# Patient Record
Sex: Female | Born: 1965 | Race: White | Hispanic: No | Marital: Married | Smoking: Never smoker
Health system: Southern US, Community
[De-identification: ages and names within clinical notes are randomized; demographics above are authoritative.]

## PROBLEM LIST (undated history)

## (undated) DIAGNOSIS — E109 Type 1 diabetes mellitus without complications: Secondary | ICD-10-CM

## (undated) DIAGNOSIS — D179 Benign lipomatous neoplasm, unspecified: Secondary | ICD-10-CM

## (undated) DIAGNOSIS — F418 Other specified anxiety disorders: Secondary | ICD-10-CM

## (undated) DIAGNOSIS — S73109A Unspecified sprain of unspecified hip, initial encounter: Secondary | ICD-10-CM

## (undated) DIAGNOSIS — E1069 Type 1 diabetes mellitus with other specified complication: Secondary | ICD-10-CM

## (undated) DIAGNOSIS — I341 Nonrheumatic mitral (valve) prolapse: Secondary | ICD-10-CM

## (undated) DIAGNOSIS — E785 Hyperlipidemia, unspecified: Secondary | ICD-10-CM

## (undated) DIAGNOSIS — E119 Type 2 diabetes mellitus without complications: Secondary | ICD-10-CM

## (undated) DIAGNOSIS — F419 Anxiety disorder, unspecified: Secondary | ICD-10-CM

## (undated) DIAGNOSIS — E039 Hypothyroidism, unspecified: Secondary | ICD-10-CM

## (undated) DIAGNOSIS — E7800 Pure hypercholesterolemia, unspecified: Secondary | ICD-10-CM

## (undated) DIAGNOSIS — G43009 Migraine without aura, not intractable, without status migrainosus: Secondary | ICD-10-CM

## (undated) HISTORY — DX: Hypothyroidism, unspecified: E03.9

## (undated) HISTORY — PX: LAPAROSCOPIC LYSIS OF ADHESIONS: SHX5905

## (undated) HISTORY — DX: Anxiety disorder, unspecified: F41.9

## (undated) HISTORY — DX: Migraine without aura, not intractable, without status migrainosus: G43.009

## (undated) HISTORY — DX: Nonrheumatic mitral (valve) prolapse: I34.1

## (undated) HISTORY — DX: Hyperlipidemia, unspecified: E10.69

## (undated) HISTORY — DX: Type 1 diabetes mellitus with other specified complication: E78.5

## (undated) HISTORY — PX: LUMBAR MICRODISCECTOMY: SHX99

## (undated) HISTORY — DX: Type 2 diabetes mellitus without complications: E11.9

## (undated) HISTORY — DX: Other specified anxiety disorders: F41.8

## (undated) HISTORY — DX: Unspecified sprain of unspecified hip, initial encounter: S73.109A

## (undated) HISTORY — DX: Type 1 diabetes mellitus without complications: E10.9

## (undated) HISTORY — PX: OTHER SURGICAL HISTORY: SHX169

## (undated) HISTORY — DX: Pure hypercholesterolemia, unspecified: E78.00

## (undated) HISTORY — PX: LIPOMA EXCISION: SHX5283

## (undated) HISTORY — PX: BACK SURGERY: SHX140

## (undated) HISTORY — DX: Benign lipomatous neoplasm, unspecified: D17.9

---

## 2003-06-25 DIAGNOSIS — I341 Nonrheumatic mitral (valve) prolapse: Secondary | ICD-10-CM

## 2003-06-25 HISTORY — DX: Nonrheumatic mitral (valve) prolapse: I34.1

## 2010-08-23 DIAGNOSIS — Z8679 Personal history of other diseases of the circulatory system: Secondary | ICD-10-CM | POA: Insufficient documentation

## 2010-12-11 HISTORY — PX: TRANSTHORACIC ECHOCARDIOGRAM: SHX275

## 2011-02-13 HISTORY — PX: TRANSESOPHAGEAL ECHOCARDIOGRAM: SHX273

## 2019-10-23 DIAGNOSIS — K4190 Unilateral femoral hernia, without obstruction or gangrene, not specified as recurrent: Secondary | ICD-10-CM

## 2019-10-23 HISTORY — DX: Unilateral femoral hernia, without obstruction or gangrene, not specified as recurrent: K41.90

## 2019-12-07 DIAGNOSIS — E039 Hypothyroidism, unspecified: Secondary | ICD-10-CM | POA: Diagnosis not present

## 2019-12-07 DIAGNOSIS — Z794 Long term (current) use of insulin: Secondary | ICD-10-CM | POA: Diagnosis not present

## 2019-12-07 DIAGNOSIS — Z789 Other specified health status: Secondary | ICD-10-CM | POA: Diagnosis not present

## 2019-12-07 DIAGNOSIS — F3289 Other specified depressive episodes: Secondary | ICD-10-CM | POA: Diagnosis not present

## 2019-12-07 DIAGNOSIS — E109 Type 1 diabetes mellitus without complications: Secondary | ICD-10-CM | POA: Diagnosis not present

## 2020-02-07 ENCOUNTER — Encounter: Payer: Self-pay | Admitting: Nurse Practitioner

## 2020-02-07 ENCOUNTER — Other Ambulatory Visit: Payer: Self-pay

## 2020-02-07 ENCOUNTER — Ambulatory Visit: Payer: 59 | Admitting: Nurse Practitioner

## 2020-02-07 ENCOUNTER — Telehealth: Payer: Self-pay | Admitting: *Deleted

## 2020-02-07 VITALS — BP 124/80 | Ht 68.0 in | Wt 145.0 lb

## 2020-02-07 DIAGNOSIS — K4190 Unilateral femoral hernia, without obstruction or gangrene, not specified as recurrent: Secondary | ICD-10-CM

## 2020-02-07 NOTE — Telephone Encounter (Signed)
Staff message sent to referral coordinator to schedule in [place].

## 2020-02-07 NOTE — Patient Instructions (Addendum)
Schedule mammogram! [facility] [phone] [address] [place], [place] [address]   Hernia, Adult     A hernia happens when tissue inside your body pushes out through a weak spot in your belly muscles (abdominal wall). This makes a round lump (bulge). The lump may be:  In a scar from surgery that was done in your belly (incisional hernia).  Near your belly button (umbilical hernia).  In your groin (inguinal hernia). Your groin is the area where your leg meets your lower belly (abdomen). This kind of hernia could also be: ? In your scrotum, if you are female. ? In folds of skin around your vagina, if you are female.  In your upper thigh (femoral hernia).  Inside your belly (hiatal hernia). This happens when your stomach slides above the muscle between your belly and your chest (diaphragm). If your hernia is small and it does not cause pain, you may not need treatment. If your hernia is large or it causes pain, you may need surgery. Follow these instructions at home: Activity  Avoid stretching or overusing (straining) the muscles near your hernia. Straining can happen when you: ? Lift something heavy. ? Poop (have a bowel movement).  Do not lift anything that is heavier than 10 lb (4.5 kg), or the limit that you are told, until your doctor says that it is safe.  Use the strength of your legs when you lift something heavy. Do not use only your back muscles to lift. General instructions  Do these things if told by your doctor so you do not have trouble pooping (constipation): ? Drink enough fluid to keep your pee (urine) pale yellow. ? Eat foods that are high in fiber. These include fresh fruits and vegetables, whole grains, and beans. ? Limit foods that are high in fat and processed sugars. These include foods that are fried or sweet. ? Take medicine for trouble pooping.  When you cough, try to cough gently.  You may try to push your hernia in  by very gently pressing on it when you are lying down. Do not try to force the bulge back in if it will not push in easily.  If you are overweight, work with your doctor to lose weight safely.  Do not use any products that have nicotine or tobacco in them. These include cigarettes and e-cigarettes. If you need help quitting, ask your doctor.  If you will be having surgery (hernia repair), watch your hernia for changes in shape, size, or color. Tell your doctor if you see any changes.  Take over-the-counter and prescription medicines only as told by your doctor.  Keep all follow-up visits as told by your doctor. Contact a doctor if:  You get new pain, swelling, or redness near your hernia.  You poop fewer times in a week than normal.  You have trouble pooping.  You have poop (stool) that is more dry than normal.  You have poop that is harder or larger than normal. Get help right away if:  You have a fever.  You have belly pain that gets worse.  You feel sick to your stomach (nauseous).  You throw up (vomit).  Your hernia cannot be pushed in by very gently pressing on it when you are lying down. Do not try to force the bulge back in if it will not push in easily.  Your hernia: ? Changes in shape or size. ? Changes color. ? Feels hard  or it hurts when you touch it. These symptoms may represent a serious problem that is an emergency. Do not wait to see if the symptoms will go away. Get medical help right away. Call your local emergency services (911 in the [place] Summary  A hernia happens when tissue inside your body pushes out through a weak spot in the belly muscles. This creates a bulge.  If your hernia is small and it does not hurt, you may not need treatment. If your hernia is large or it hurts, you may need surgery.  If you will be having surgery, watch your hernia for changes in shape, size, or color. Tell your doctor about any changes. This information is not  intended to replace advice given to you by your health care provider. Make sure you discuss any questions you have with your health care provider. Document Revised: 10/01/2018 Document Reviewed: 03/12/2017 Elsevier Patient Education  [place].

## 2020-02-07 NOTE — Telephone Encounter (Signed)
-----   Message from Tamela Gammon, NP sent at 02/07/2020  1:00 PM EDT ----- Please send referral to general surgery for possible left femoral hernia. Thank you

## 2020-02-07 NOTE — Progress Notes (Signed)
   Acute Office Visit  Subjective:    Patient ID: Maria Evans, female    DOB: 1965-10-18, 54 y.o.   MRN: [id number]   HPI 54 y.o. G1P0001 presents today as new patient for possible hernia. She is here for a  bulge she noticed 3 months ago on her left groin. Denies pain, redness, changes in bowel habits, fever. Did notice pain with a bowel movement recently that lingered. Had an obstruction with adhesions years back, laparoscopoc lysis of adhesions performed.    Review of Systems  Constitutional: Negative.   Gastrointestinal: Positive for abdominal pain (left groin). Negative for abdominal distention, constipation and diarrhea.       Bulge on left groin  Genitourinary: Negative.        Objective:    Physical Exam Constitutional:      Appearance: Normal appearance.  Abdominal:     General: Abdomen is flat. Bowel sounds are normal. There is no distension.     Palpations: Abdomen is soft.     Tenderness: There is no abdominal tenderness.     Hernia: A hernia is present. Hernia is present in the left femoral area.       BP 124/80 (BP Location: Right Arm, Patient Position: Sitting, Cuff Size: Normal)   Ht 5\' 8"  (1.727 m)   Wt 145 lb (65.8 kg)   LMP 02/07/2016   BMI 22.05 kg/m  Wt Readings from Last 3 Encounters:  02/07/20 145 lb (65.8 kg)        Assessment & Plan:   Problem List Items Addressed This Visit    None    Visit Diagnoses    Non-recurrent unilateral femoral hernia without obstruction or gangrene    -  Primary      Plan: Based on presentation and exam this is most likely a femoral hernia as it is below the crease of the groin, did not reduce completely with lying down, and did not have cough impulse. We will send a referral to general surgery for further evaluation. She is aware we do not manage hernias. She agreeable to plan.     Tamela Gammon [facility], 12:54 PM 02/07/2020

## 2020-02-08 NOTE — Telephone Encounter (Signed)
Patient scheduled on 03/01/20 @ 10:30am with Gurney Maxin

## 2020-03-01 DIAGNOSIS — K4190 Unilateral femoral hernia, without obstruction or gangrene, not specified as recurrent: Secondary | ICD-10-CM | POA: Diagnosis not present

## 2020-03-03 ENCOUNTER — Other Ambulatory Visit: Payer: Self-pay | Admitting: General Surgery

## 2020-03-03 DIAGNOSIS — K4190 Unilateral femoral hernia, without obstruction or gangrene, not specified as recurrent: Secondary | ICD-10-CM

## 2020-03-07 ENCOUNTER — Other Ambulatory Visit: Payer: Self-pay

## 2020-03-07 ENCOUNTER — Encounter: Payer: Self-pay | Admitting: Nurse Practitioner

## 2020-03-07 ENCOUNTER — Ambulatory Visit (INDEPENDENT_AMBULATORY_CARE_PROVIDER_SITE_OTHER): Payer: 59 | Admitting: Nurse Practitioner

## 2020-03-07 VITALS — BP 122/80 | Ht 68.0 in | Wt 145.2 lb

## 2020-03-07 DIAGNOSIS — M8580 Other specified disorders of bone density and structure, unspecified site: Secondary | ICD-10-CM

## 2020-03-07 DIAGNOSIS — Z01419 Encounter for gynecological examination (general) (routine) without abnormal findings: Secondary | ICD-10-CM | POA: Diagnosis not present

## 2020-03-07 DIAGNOSIS — R87810 Cervical high risk human papillomavirus (HPV) DNA test positive: Secondary | ICD-10-CM | POA: Diagnosis not present

## 2020-03-07 NOTE — Progress Notes (Signed)
   [facility] SAMPLE [birth year] [id number]   History:  54 y.o. G1P0001 presents for annual exam without GYN complaints. 2003 LEEP, 02/2018 positive high risk HPV negative 16/18. Postmenopausal - no HRT, no bleeding. IUD removed 02/2018 with no resumption of cycles, FSH then was 49.9. History of DM and hypothyroidism managed by endocrinology. Was seen by me 02/07/2020 for probable femoral hernia with referral to general surgery. Ultrasound scheduled for 9/16/20211. Osteopenic, most recent bone density in 2019 showing t-score of -1.0 without elevated FRAX. Has appointment soon to establish care with PCP and will have labs done then.   Gynecologic History Patient's last menstrual period was 02/07/2016.   Contraception: post menopausal status Last Pap: 02/2018. Results were: High risk HPV negative 16/18 Last mammogram: 02/2019 . Results were: normal Last colonoscopy: 1-2 years ago per patient. Results were: normal Last Dexa: 2019. Results were: t-score -1.0  Past medical history, past surgical history, family history and social history were all reviewed and documented in the EPIC chart.  ROS:  A ROS was performed and pertinent positives and negatives are included.  Exam:  Vitals:   03/07/20 0943  BP: 122/80  Weight: 145 lb 3.2 oz (65.9 kg)  Height: 5\' 8"  (1.727 m)   Body mass index is 22.08 kg/m.  General appearance:  Normal Thyroid:  Symmetrical, normal in size, without palpable masses or nodularity. Respiratory  Auscultation:  Clear without wheezing or rhonchi Cardiovascular  Auscultation:  Regular rate, without rubs, murmurs or gallops  Edema/varicosities:  Not grossly evident Abdominal  Soft,nontender, without masses, guarding or rebound.  Liver/spleen:  No organomegaly noted  Hernia:  Left femoral bulge with standing, non-tender  Skin  Inspection:  Grossly normal   Breasts: Examined lying and sitting.   Right: Without masses, retractions, discharge or axillary  adenopathy.   Left: Without masses, retractions, discharge or axillary adenopathy. Gentitourinary   Inguinal/mons:  Normal without inguinal adenopathy  External genitalia:  Normal  BUS/Urethra/Skene's glands:  Normal  Vagina:  Normal  Cervix:  Normal  Uterus:  Anteverted, normal in size, shape and contour.  Midline and mobile  Adnexa/parametria:     Rt: Without masses or tenderness.   Lt: Without masses or tenderness.  Anus and perineum: Normal  Digital rectal exam: Normal sphincter tone without palpated masses or tenderness  Assessment/Plan:  54 y.o. G1P0001 for annual exam.   Well woman exam with routine gynecological exam - Education provided on SBEs, importance of preventative screenings, current guidelines, high calcium diet, regular exercise, and multivitamin daily.   Osteopenia, unspecified location - 2019 t-score -1.0 without elevated FRAX. Will have PCP check Vitamin D at visit.  Papanicolaou smear of cervix with positive high risk human papilloma virus (HPV) test - 02/2018. Repeat pap today with HR HPV typing. 2003 LEEP.   Follow up in 1 year for annual       [place], 9:57 AM 03/07/2020

## 2020-03-07 NOTE — Patient Instructions (Addendum)
[facility] [phone] [address] [place], [place] [address]   Health Maintenance for Postmenopausal Women Menopause is a normal process in which your ability to get pregnant comes to an end. This process happens slowly over many months or years, usually between the ages of 62 and 30. Menopause is complete when you have missed your menstrual periods for 12 months. It is important to talk with your health care provider about some of the most common conditions that affect women after menopause (postmenopausal women). These include heart disease, cancer, and bone loss (osteoporosis). Adopting a healthy lifestyle and getting preventive care can help to promote your health and wellness. The actions you take can also lower your chances of developing some of these common conditions. What should I know about menopause? During menopause, you may get a number of symptoms, such as:  Hot flashes. These can be moderate or severe.  Night sweats.  Decrease in sex drive.  Mood swings.  Headaches.  Tiredness.  Irritability.  Memory problems.  Insomnia. Choosing to treat or not to treat these symptoms is a decision that you make with your health care provider. Do I need hormone replacement therapy?  Hormone replacement therapy is effective in treating symptoms that are caused by menopause, such as hot flashes and night sweats.  Hormone replacement carries certain risks, especially as you become older. If you are thinking about using estrogen or estrogen with progestin, discuss the benefits and risks with your health care provider. What is my risk for heart disease and stroke? The risk of heart disease, heart attack, and stroke increases as you age. One of the causes may be a change in the body's hormones during menopause. This can affect how your body uses dietary fats, triglycerides, and cholesterol. Heart attack and stroke are medical emergencies. There are many  things that you can do to help prevent heart disease and stroke. Watch your blood pressure  High blood pressure causes heart disease and increases the risk of stroke. This is more likely to develop in people who have high blood pressure readings, are of African descent, or are overweight.  Have your blood pressure checked: ? Every 3-5 years if you are 61-53 years of age. ? Every year if you are 49 years old or older. Eat a healthy diet   Eat a diet that includes plenty of vegetables, fruits, low-fat dairy products, and lean protein.  Do not eat a lot of foods that are high in solid fats, added sugars, or sodium. Get regular exercise Get regular exercise. This is one of the most important things you can do for your health. Most adults should:  Try to exercise for at least 150 minutes each week. The exercise should increase your heart rate and make you sweat (moderate-intensity exercise).  Try to do strengthening exercises at least twice each week. Do these in addition to the moderate-intensity exercise.  Spend less time sitting. Even light physical activity can be beneficial. Other tips  Work with your health care provider to achieve or maintain a healthy weight.  Do not use any products that contain nicotine or tobacco, such as cigarettes, e-cigarettes, and chewing tobacco. If you need help quitting, ask your health care provider.  Know your numbers. Ask your health care provider to check your cholesterol and your blood sugar (glucose). Continue to have your blood tested as directed by your health care provider. Do I need screening for cancer? Depending on your health  history and family history, you may need to have cancer screening at different stages of your life. This may include screening for:  Breast cancer.  Cervical cancer.  Lung cancer.  Colorectal cancer. What is my risk for osteoporosis? After menopause, you may be at increased risk for osteoporosis. Osteoporosis is  a condition in which bone destruction happens more quickly than new bone creation. To help prevent osteoporosis or the bone fractures that can happen because of osteoporosis, you may take the following actions:  If you are 39-33 years old, get at least 1,000 mg of calcium and at least 600 mg of vitamin D per day.  If you are older than age 6 but younger than age 45, get at least 1,200 mg of calcium and at least 600 mg of vitamin D per day.  If you are older than age 62, get at least 1,200 mg of calcium and at least 800 mg of vitamin D per day. Smoking and drinking excessive alcohol increase the risk of osteoporosis. Eat foods that are rich in calcium and vitamin D, and do weight-bearing exercises several times each week as directed by your health care provider. How does menopause affect my mental health? Depression may occur at any age, but it is more common as you become older. Common symptoms of depression include:  Low or sad mood.  Changes in sleep patterns.  Changes in appetite or eating patterns.  Feeling an overall lack of motivation or enjoyment of activities that you previously enjoyed.  Frequent crying spells. Talk with your health care provider if you think that you are experiencing depression. General instructions See your health care provider for regular wellness exams and vaccines. This may include:  Scheduling regular health, dental, and eye exams.  Getting and maintaining your vaccines. These include: ? Influenza vaccine. Get this vaccine each year before the flu season begins. ? Pneumonia vaccine. ? Shingles vaccine. ? Tetanus, diphtheria, and pertussis (Tdap) booster vaccine. Your health care provider may also recommend other immunizations. Tell your health care provider if you have ever been abused or do not feel safe at home. Summary  Menopause is a normal process in which your ability to get pregnant comes to an end.  This condition causes hot flashes, night  sweats, decreased interest in sex, mood swings, headaches, or lack of sleep.  Treatment for this condition may include hormone replacement therapy.  Take actions to keep yourself healthy, including exercising regularly, eating a healthy diet, watching your weight, and checking your blood pressure and blood sugar levels.  Get screened for cancer and depression. Make sure that you are up to date with all your vaccines. This information is not intended to replace advice given to you by your health care provider. Make sure you discuss any questions you have with your health care provider. Document Revised: 06/03/2018 Document Reviewed: 06/03/2018 Elsevier Patient Education  2020 [employer].

## 2020-03-07 NOTE — Addendum Note (Signed)
Addended by: Thamas Jaegers on: 03/07/2020 10:25 AM   Modules accepted: Orders

## 2020-03-09 ENCOUNTER — Ambulatory Visit
Admission: RE | Admit: 2020-03-09 | Discharge: 2020-03-09 | Disposition: A | Discharge status: Home / Self Care | Payer: 59 | Source: Ambulatory Visit | Attending: General Surgery | Admitting: General Surgery

## 2020-03-09 ENCOUNTER — Other Ambulatory Visit: Payer: Self-pay

## 2020-03-09 ENCOUNTER — Encounter: Payer: Self-pay | Admitting: Internal Medicine

## 2020-03-09 ENCOUNTER — Other Ambulatory Visit: Payer: Self-pay | Admitting: Internal Medicine

## 2020-03-09 ENCOUNTER — Ambulatory Visit (INDEPENDENT_AMBULATORY_CARE_PROVIDER_SITE_OTHER): Payer: 59 | Admitting: Internal Medicine

## 2020-03-09 VITALS — BP 124/86 | HR 86 | Wt 145.0 lb

## 2020-03-09 DIAGNOSIS — R1909 Other intra-abdominal and pelvic swelling, mass and lump: Secondary | ICD-10-CM | POA: Diagnosis not present

## 2020-03-09 DIAGNOSIS — E1065 Type 1 diabetes mellitus with hyperglycemia: Secondary | ICD-10-CM

## 2020-03-09 DIAGNOSIS — K4190 Unilateral femoral hernia, without obstruction or gangrene, not specified as recurrent: Secondary | ICD-10-CM

## 2020-03-09 LAB — PAP, TP IMAGING W/ HPV RNA, RFLX HPV TYPE 16,18/45: HPV DNA High Risk: NOT DETECTED

## 2020-03-09 LAB — POCT GLYCOSYLATED HEMOGLOBIN (HGB A1C): Hemoglobin A1C: 6.9 % — AB (ref 4.0–5.6)

## 2020-03-09 MED ORDER — GLUCAGON 3 MG/DOSE NA POWD: 3.0000 mg | Freq: Once | NASAL | 11 refills | Status: DC | PRN

## 2020-03-09 NOTE — Patient Instructions (Addendum)
Please continue: - Tresiba 12-13 units daily but move it to am - NovoLog 1-5 units 15 min before meals  Please do the following approximately 15 minutes before every meal: - calculate carbs (C) - check sugars (S) - inject insulin (I)  Please continue Levothyroxine 112 mcg daily.  Take the thyroid hormone every day, with water, at least 30 minutes before breakfast, separated by at least 4 hours from: - acid reflux medications - calcium - iron - multivitamins  Please move Levothyroxine to am.  Please return in 3 months.   Basic Rules for Patients with Type I Diabetes Mellitus  1. The American Diabetes Association (ADA) recommended targets: - fasting sugar 80-130 - after meal sugar <180 - HbA1C <7%  2. Engage in ?150 min moderate exercise per week  3. Make sure you have ?8h of sleep every night as this helps both blood sugars and your weight.  4. Always keep a sugar log (not only record in your meter) and bring it to all appointments with [place].  5. "15-15 rule" for hypoglycemia: if sugars are low, take 15 g of carbs** ("fast sugar" - e.g. 4 glucose tablets, 4 oz orange juice), wait 15 min, then check sugars again. If still <80, repeat. Continue  until your sugars >80, then eat a normal meal.   6. Teach family members and coworkers to inject glucagon. Have a glucagon set at home and one at work. They should call 911 after using the set.  7. Check sugar before driving. If <100, correct, and only start driving if sugars rise ?100. Check sugar every hour when on a long drive.  8. Check sugar before exercising. If <100, correct, and only start exercising if sugars rise ?100. Check sugar every hour when on a long exercise routine and 1h after you finished exercising.   If >250, check urine for ketones. If you have moderate-large ketones in urine, do not start exercise. Hydrate yourself with clear liquids and correct the high sugar. Recheck sugars and ketones before attempting to  exercise.  Be aware that you might need less insulin when exercising.  *intense, short, exercise bursts can increase your sugars, but  *less intense, longer (>1h), exercise routines can decrease your sugars.   9. Make sure you have a MedAlert bracelet or pendant mentioning "Type I Diabetes Mellitus". If you have a prior episode of severe hypoglycemia or hypoglycemia unawareness, it should also mention this.  10. Please do not walk barefoot. Inspect your feet for sores/cuts and let us know if you have them.  **E.g. of "fast carbs": ? first choice (15 g):  1 tube glucose gel, GlucoPouch 15, 2 oz glucose liquid ? second choice (15-16 g):  3 or 4 glucose tablets (best taken  with water), 15 Dextrose Bits chewable ? third choice (15-20 g):   cup fruit juice,  cup regular soda, 1 cup skim milk,  1 cup sports drink ? fourth choice (15-20 g):  1 small tube [place] gel (not frosting), 2 tbsp raisins, 1 tbsp table sugar,  candy, jelly beans, gum drops - check package for carb amount   (adapted from: Lenice Pressman. "Insulin therapy and hypoglycemia" Endocrinol Metab Clin N Am 2012, 41: 57-87)  Sick Day Rules for Diabetes  Think S-K-I-L-L:  Sugars:  - if glucose >200, check every 3h and drink sugar free liquids  - if glucose <200, drink carb-containing liquids and recheck 30 min later  - if glucose high, correct with insulin  - if sugars <  60, initiate hypoglycemia management (take 15 g of fast carbs and check sugars in 15 min  - repeat until sugars remain >100).  Ketones:  When to check ketones?  When glucose >300 x2 if on insulin injections  When nausea, vomiting, diarrhea, abdominal pain, headache, fever - even if glucose is normal or low - because in this case, you need both glucose and insulin.    - if you have ketone strips for blood >> if ketones are more or equal than 0.6, need to increase insulin - if you have ketone strips for urine >> if ketones are more or equal than  "small", need to increase insulin  Insulin: Never skip long acting insulin, even if not eating!   Urine ketones Blood ketones Extra insulin?  no <0.6 no  small 0.6-1.5 Increase dose by 5%  moderate 1.5-3 Increase dose by 10%  large >3 Increase dose by at least 20%   Liquids: - if glucose >200, check every 3h and drink sugar free liquids  - if glucose <200, small sips of carb-containing liquids (e.g. Ginger ale, Gatorade, juice, etc.)  Let us know!   Call us if: Go to ED if: Call your primary care doctor if:  Sugars >300 for >8h Severe abdominal pain Fever >100F for 24h  Moderate to large  urine ketones or blood ketones >1.5 Difficulty breathing Other chronic diseases flaring up  Vomiting and unable to keep liquids down Signs of dehydration

## 2020-03-09 NOTE — Progress Notes (Signed)
Patient ID: Maria Evans, female   DOB: 09/12/1965, 54 y.o.   MRN: [id number]   This visit occurred during the SARS-CoV-2 public health emergency.  Safety protocols were in place, including screening questions prior to the visit, additional usage of staff PPE, and extensive cleaning of exam room while observing appropriate contact time as indicated for disinfecting solutions.   HPI: Maria Evans is a 54 y.o.-year-old very pleasant female, referred by her previous endocrinologist, Dr. Mary K. Lawrence ([place], [place]), for management of DM1, diagnosed at 54 y/o, uncontrolled, without long term complications.  DM1:  Reviewed HbA1c levels: 02/08/2019: HbA1c 7.5% No results found for: HGBA1C  Previously on Omni pod 2017-fall 2020 but stopped after running out of supplies and had pbs with insertion sites.  Currently on: -Tresiba 12-13 units daily -NovoLog 2-6 units 3 times a day before meals (1:10 ICR, target 120, ISF 60-65) She tried inhaled insulin in the past but she had a persistently severe cough and had to stop. She tried Glimepiride - not very effective.  She has a freestyle libre CGM. She would not want alarms (not on Freestyle 2)  Pt checks her sugars >4x a day and they are with her CGM.  Unfortunately, we could not download her CGM tracings.  The following are approximations of her blood sugars per review of her CGM tracings on her phone: - am: 80-200s - 2h after b'fast: 100-220 - lunch: 80-180 - 2h after lunch: 100-220 - dinner: 80-180 - 2h after dinner: 180-200 - bedtime: see above - nighttime: 180-280 Lowest sugar was 20-30s before after starting insulin >> more recently 45; she has hypoglycemia awareness at 70.  No glucagon kit at home. No previous hypoglycemia admission. She may drop her sugars after exercise -usually exercises earlier in the day. Highest sugar was >600 (pump failed). No previous DKA admissions.    Pt's meals are: - Breakfast:  Fruit, peanut butter and banana sandwich, oatmeal with fruit - Lunch: Turkey and cheese sandwich + pickle - Dinner: Home-cooked family meals - Snacks: 1 to 2 almonds, cheese, fruit, veggies  -+ History of stage III CKD, resolved after stopping NSAIDs last BUN/creatinine:  No results found for: BUN No results found for: CREATININE  - + HL: last set of lipids: No results found for: CHOL, HDL, LDLCALC, LDLDIRECT, TRIG, CHOLHDL On Lipitor 20.  - last eye exam was in 12/2019: No DR.   - no numbness and tingling in her feet.  No FH of DM.  She also has hypothyroidism:  Pt is on levothyroxine 112 mcg daily, taken: - at night! - fasting - at least 30 min from b'fast - no Ca, Fe, PPIs - + Multivitamins in am - not on Biotin  Last TSH: 02/09/2019: TSH 0.7474   She also has a history of anxiety/depression, migraines.  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  ROS: Constitutional: no weight gain, no weight loss, + fatigue, + hot flashes, no subjective hypothermia, no nocturia Eyes: no blurry vision, no xerophthalmia ENT: no sore throat, no nodules palpated in neck, no dysphagia, no odynophagia, no hoarseness, no tinnitus, no hypoacusis Cardiovascular: no CP, no SOB, no palpitations, no leg swelling Respiratory: no cough, no SOB, no wheezing Gastrointestinal: no N, no V, no D, no C, no acid reflux Musculoskeletal: + muscle, + joint aches Skin: no rash, no hair loss Neurological: no tremors, no numbness or tingling/no dizziness/+ rarely HAs (migraines) Psychiatric: + depression, + anxiety   Past Medical History:    Diagnosis Date  . Anxiety   . Diabetes ([place])   . Femoral hernia 10/2019  . Hypercholesteremia   . Hypothyroid   . MVP (mitral valve prolapse)    Past Surgical History:  Procedure Laterality Date  . BACK SURGERY    . LAPAROSCOPIC LYSIS OF ADHESIONS     Social History   Socioeconomic History  . Marital status: Divorced    Spouse name: Not on file  . Number of  children: 1  . Years of education: Not on file  . Highest education level: Not on file  Occupational History  . Occupation: Hospital pharmacist   Tobacco Use  . Smoking status: Never Smoker  . Smokeless tobacco: Never Used  Vaping Use  . Vaping Use: Never used  Substance and Sexual Activity  . Alcohol use: Yes    Comment: occ, wine or beer  . Drug use: Never  . Sexual activity: Yes    Birth control/protection: None  Other Topics Concern  . Not on file  Social History Narrative  . Not on file   Social Determinants of Health   Financial Resource Strain:   . Difficulty of Paying Living Expenses: Not on file  Food Insecurity:   . Worried About Charity fundraiser in the Last Year: Not on file  . Ran Out of Food in the Last Year: Not on file  Transportation Needs:   . Lack of Transportation (Medical): Not on file  . Lack of Transportation (Non-Medical): Not on file  Physical Activity:   . Days of Exercise per Week: Not on file  . Minutes of Exercise per Session: Not on file  Stress:   . Feeling of Stress : Not on file  Social Connections:   . Frequency of Communication with Friends and Family: Not on file  . Frequency of Social Gatherings with Friends and Family: Not on file  . Attends Religious Services: Not on file  . Active Member of Clubs or Organizations: Not on file  . Attends Archivist Meetings: Not on file  . Marital Status: Not on file  Intimate Partner Violence:   . Fear of Current or Ex-Partner: Not on file  . Emotionally Abused: Not on file  . Physically Abused: Not on file  . Sexually Abused: Not on file   Current Outpatient Medications on File Prior to Visit  Medication Sig Dispense Refill  . atorvastatin (LIPITOR) 20 MG tablet Take 20 mg by mouth daily.    Marland Kitchen escitalopram (LEXAPRO) 20 MG tablet as directed.    . insulin aspart (NOVOLOG) 100 UNIT/ML injection Inject into the skin as directed.    . Insulin Degludec (TRESIBA) 100 UNIT/ML SOLN  Inject into the skin as directed.    Marland Kitchen levothyroxine (SYNTHROID) 112 MCG tablet Take 112 mcg by mouth daily.    . Multiple Vitamin (MULTIVITAMIN) capsule Take 1 capsule by mouth daily.     No current facility-administered medications on file prior to visit.   Allergies  Allergen Reactions  . Macrobid [Nitrofurantoin] Itching   Family History  Problem Relation Age of Onset  . Hypercholesterolemia Mother     PE: BP 124/86   Pulse 86   Wt 145 lb (65.8 kg)   LMP 02/07/2016   SpO2 97%   BMI 22.05 kg/m  Wt Readings from Last 3 Encounters:  03/09/20 145 lb (65.8 kg)  03/07/20 145 lb 3.2 oz (65.9 kg)  02/07/20 145 lb (65.8 kg)   Temp Readings from Last 3  Encounters:  No data found for Temp   BP Readings from Last 3 Encounters:  03/09/20 124/86  03/07/20 122/80  02/07/20 124/80   Pulse Readings from Last 3 Encounters:  03/09/20 86           Constitutional: normal weight, in NAD Eyes: PERRLA, EOMI, no exophthalmos ENT: moist mucous membranes, no thyromegaly, no cervical lymphadenopathy Cardiovascular: RRR, No MRG Respiratory: CTA B Gastrointestinal: abdomen soft, NT, ND, BS+ Musculoskeletal: no deformities, strength intact in all 4 Skin: moist, warm, no rashes Neurological: no tremor with outstretched hands, DTR normal in all 4  ASSESSMENT: 1. DM1, uncontrolled, without long-term complications, but with hyperglycemia -She has a history of CKD stage III, which resolved after stopping NSAIDs  2. Hypothyroidism  PLAN:  1. Patient with long-standing, fairly well controlled DM1, previously on an Omni pod insulin pump, now on basal-bolus insulin regimen after she ran out of Omnipod supplies. - at today's visit, HbA1c is 6.9% (better) -  CGM interpretation: -  - I suggested to: There are no Patient Instructions on file for this visit. - Strongly advised her to start checking sugars at different times of the day - check at least 4 times a day, rotating checks - given  sugar log and advised how to fill it and to bring it at next appt  - given foot care handout and explained the principles  - given instructions for hypoglycemia management "15-15 rule"  - advised for yearly eye exams - sent glucagon kit Rx to pharmacy - advised to get ketone strips - advised to always have Glu tablets with her - advised for a Med-alert bracelet mentioning "type 1 diabetes mellitus". - given instruction Re: exercising and driving in DM1 (pt instructions) - also, given information about sick day rules - Return to clinic in 3 months   2. Hypothyroidism - latest thyroid labs reviewed with pt >> normal: 01/2019: 0.7474 - she continues on LT4 112 mcg daily - pt feels good on this dose. - we discussed about taking the thyroid hormone every day, with water, >30 minutes before breakfast, separated by >4 hours from acid reflux medications, calcium, iron, multivitamins. Pt. is eating her levothyroxine I and I strongly advised her to move into the morning, since daily that night is not conducive to good absorption of the medication.  I advised her to move multivitamins to the morning. - will check thyroid tests at next visit, after moving levothyroxine at night   Cristina Gherghe, MD PhD [facility]   

## 2020-03-17 ENCOUNTER — Telehealth: Payer: Self-pay | Admitting: Internal Medicine

## 2020-03-17 NOTE — Telephone Encounter (Signed)
Records sent

## 2020-03-17 NOTE — Telephone Encounter (Signed)
Referring provider requested that we send the office notes from patients 1st visit to them.  Fax # [phone]  Attn: Medical Records

## 2020-03-20 NOTE — Telephone Encounter (Signed)
error 

## 2020-03-30 DIAGNOSIS — Z1159 Encounter for screening for other viral diseases: Secondary | ICD-10-CM | POA: Diagnosis not present

## 2020-04-03 DIAGNOSIS — D171 Benign lipomatous neoplasm of skin and subcutaneous tissue of trunk: Secondary | ICD-10-CM | POA: Diagnosis not present

## 2020-04-10 ENCOUNTER — Ambulatory Visit: Payer: 59 | Attending: Internal Medicine

## 2020-04-10 DIAGNOSIS — Z23 Encounter for immunization: Secondary | ICD-10-CM

## 2020-04-10 NOTE — Progress Notes (Signed)
   Covid-19 Vaccination Clinic  Name:  KEISHLA OYER    MRN: [id number] DOB: 1966-03-20  04/10/2020  [name] was observed post Covid-19 immunization for 15 minutes without incident. She was provided with Vaccine Information Sheet and instruction to access the V-Safe system.   [name] was instructed to call 911 with any severe reactions post vaccine: Marland Kitchen Difficulty breathing  . Swelling of face and throat  . A fast heartbeat  . A bad rash all over body  . Dizziness and weakness

## 2020-04-24 ENCOUNTER — Telehealth: Payer: Self-pay | Admitting: Internal Medicine

## 2020-04-24 ENCOUNTER — Other Ambulatory Visit: Payer: Self-pay

## 2020-04-24 MED ORDER — FREESTYLE LIBRE 2 SENSOR MISC: 3 refills | Status: DC

## 2020-04-24 NOTE — Telephone Encounter (Signed)
Patient called to request a new RX for [employer] (may require PA)-previously filled by Dr. Purcell Nails in [facility] be sent to [facility].

## 2020-04-24 NOTE — Telephone Encounter (Signed)
RX sent to pharmacy  

## 2020-05-08 DIAGNOSIS — Z20822 Contact with and (suspected) exposure to covid-19: Secondary | ICD-10-CM | POA: Diagnosis not present

## 2020-05-10 ENCOUNTER — Telehealth: Payer: Self-pay

## 2020-05-10 ENCOUNTER — Telehealth: Payer: Self-pay | Admitting: Internal Medicine

## 2020-05-10 NOTE — Telephone Encounter (Signed)
New message    Prior authorization on Continuous Blood Gluc Sensor (FREESTYLE LIBRE 2 SENSOR) MISC

## 2020-05-15 NOTE — Telephone Encounter (Signed)
Libre prior authorization submitted via covermymeds.  Waiting response.  (Key: [id number])Need help? Call us at [phone] Status Sent to [employer] The plan will fax you a determination, typically within 1 to 5 business days.  How do I follow up? Drug FreeStyle Libre 14 Day Sensor Form [employer] Medication Request Form [employer] Medication Request Form [phone] [phone]

## 2020-05-22 NOTE — Telephone Encounter (Signed)
He will be ok with me, but please check with the pt first, I know she told me she does not want alarms on her sensor, and Dexcom does have alarms.  She can shut them off, however. If she agrees, please send enough for 3 months with 3 refills

## 2020-05-22 NOTE — Telephone Encounter (Signed)
Called patient, LVM advising her to call back to discuss options of Dexcom.  Left call back number.

## 2020-05-22 NOTE — Telephone Encounter (Signed)
Received notification from [place] that the [facility] was denied due to patient needing to have tried the Dexcom.  Would you like to send this?  Thanks!

## 2020-05-24 ENCOUNTER — Other Ambulatory Visit: Payer: Self-pay

## 2020-05-24 MED ORDER — DEXCOM G6 TRANSMITTER MISC: 3 refills | Status: DC

## 2020-05-24 MED ORDER — DEXCOM G6 SENSOR MISC: 3 refills | Status: DC

## 2020-05-24 MED ORDER — DEXCOM G6 RECEIVER DEVI: 0 refills | Status: DC

## 2020-05-24 NOTE — Telephone Encounter (Signed)
Called patient, she states that she will try the dexcom- I have sent this to her pharmacy.  She will come for her appointment on 12/17 to discuss any issues.

## 2020-05-25 ENCOUNTER — Telehealth: Payer: Self-pay | Admitting: Internal Medicine

## 2020-05-25 NOTE — Telephone Encounter (Signed)
[place] called for prior authorization for Dexcom sensor.  Please advise.

## 2020-05-25 NOTE — Telephone Encounter (Signed)
Outbound call to [place] to advise [employer] approved Dexcom G6 Meter sensors and transmitters reference authorization numbers [id number], [id number] and [id number] provided.

## 2020-05-31 ENCOUNTER — Other Ambulatory Visit: Payer: Self-pay | Admitting: Nurse Practitioner

## 2020-05-31 DIAGNOSIS — Z1231 Encounter for screening mammogram for malignant neoplasm of breast: Secondary | ICD-10-CM

## 2020-06-09 ENCOUNTER — Ambulatory Visit (INDEPENDENT_AMBULATORY_CARE_PROVIDER_SITE_OTHER): Payer: 59 | Admitting: Internal Medicine

## 2020-06-09 ENCOUNTER — Encounter: Payer: Self-pay | Admitting: Internal Medicine

## 2020-06-09 ENCOUNTER — Other Ambulatory Visit: Payer: Self-pay

## 2020-06-09 ENCOUNTER — Other Ambulatory Visit: Payer: Self-pay | Admitting: Internal Medicine

## 2020-06-09 VITALS — BP 120/78 | HR 73 | Ht 68.0 in | Wt 151.0 lb

## 2020-06-09 DIAGNOSIS — E1065 Type 1 diabetes mellitus with hyperglycemia: Secondary | ICD-10-CM

## 2020-06-09 DIAGNOSIS — E039 Hypothyroidism, unspecified: Secondary | ICD-10-CM | POA: Diagnosis not present

## 2020-06-09 LAB — T4, FREE: Free T4: 0.97 ng/dL (ref 0.60–1.60)

## 2020-06-09 LAB — COMPREHENSIVE METABOLIC PANEL
ALT: 18 U/L (ref 0–35)
AST: 19 U/L (ref 0–37)
Albumin: 4.5 g/dL (ref 3.5–5.2)
Alkaline Phosphatase: 59 U/L (ref 39–117)
BUN: 10 mg/dL (ref 6–23)
CO2: 32 mEq/L (ref 19–32)
Calcium: 9.4 mg/dL (ref 8.4–10.5)
Chloride: 103 mEq/L (ref 96–112)
Creatinine, Ser: 0.75 mg/dL (ref 0.40–1.20)
GFR: 91.2 mL/min (ref >60.00)
Glucose, Bld: 101 mg/dL — ABNORMAL HIGH (ref 70–99)
Potassium: 4.2 mEq/L (ref 3.5–5.1)
Sodium: 140 mEq/L (ref 135–145)
Total Bilirubin: 0.6 mg/dL (ref 0.2–1.2)
Total Protein: 7.3 g/dL (ref 6.0–8.3)

## 2020-06-09 LAB — LIPID PANEL
Cholesterol: 162 mg/dL (ref 0–200)
HDL: 73.5 mg/dL (ref >39.00)
LDL Cholesterol: 79 mg/dL (ref 0–99)
NonHDL: 88.16
Total CHOL/HDL Ratio: 2
Triglycerides: 44 mg/dL (ref 0.0–149.0)
VLDL: 8.8 mg/dL (ref 0.0–40.0)

## 2020-06-09 LAB — MICROALBUMIN / CREATININE URINE RATIO
Creatinine,U: 15.3 mg/dL
Microalb Creat Ratio: 4.6 mg/g (ref 0.0–30.0)
Microalb, Ur: <0.7 mg/dL (ref 0.0–1.9)

## 2020-06-09 LAB — POCT GLYCOSYLATED HEMOGLOBIN (HGB A1C): Hemoglobin A1C: 6.3 % — AB (ref 4.0–5.6)

## 2020-06-09 LAB — TSH: TSH: 0.36 u[IU]/mL (ref 0.35–4.50)

## 2020-06-09 MED ORDER — TRESIBA FLEXTOUCH 100 UNIT/ML ~~LOC~~ SOPN: PEN_INJECTOR | SUBCUTANEOUS | 3 refills | Status: DC

## 2020-06-09 MED ORDER — ATORVASTATIN CALCIUM 20 MG PO TABS
20.0000 mg | ORAL_TABLET | Freq: Every day | ORAL | 3 refills | Status: DC
Start: 2020-06-09 — End: 2020-06-09

## 2020-06-09 MED ORDER — NOVOLOG FLEXPEN 100 UNIT/ML ~~LOC~~ SOPN: PEN_INJECTOR | SUBCUTANEOUS | 3 refills | Status: DC

## 2020-06-09 NOTE — Patient Instructions (Addendum)
Please continue: - Tresiba 12-13 units at bedtime - NovoLog 2-6 units 3 times a day before meals (1:10 ICR, target 120, ISF 60-65)  Try to eliminate the snack at night or replace it with non-carbohydrate foods.  Try to add healthy fats to higher glycemic index meals.  Please continue to try to inject Novolog 15 min before meals.  Please continue Levothyroxine 112 mcg daily.  Take the thyroid hormone every day, with water, at least 30 minutes before breakfast, separated by at least 4 hours from: - acid reflux medications - calcium - iron - multivitamins  Please stop at the lab.  Please return in 3-4 months with your sugar log.

## 2020-06-09 NOTE — Progress Notes (Addendum)
Patient ID: Maria Evans, female   DOB: 1966/06/01, 54 y.o.   MRN: [id number]   This visit occurred during the SARS-CoV-2 public health emergency.  Safety protocols were in place, including screening questions prior to the visit, additional usage of staff PPE, and extensive cleaning of exam room while observing appropriate contact time as indicated for disinfecting solutions.   HPI: Maria Evans is a 54 y.o.-year-old very pleasant female, initially referred by her previous endocrinologist, Dr. Garvin Evans. [address] ([place], [place]), returning for follow-up for DM1, diagnosed at 54 y/o, fairly well controlled, without long term complications and hypothyroidism. Last visit 3 months ago.  DM1:  Reviewed HbA1c levels: Lab Results  Component Value Date   HGBA1C 6.9 (A) 03/09/2020  02/08/2019: HbA1c 7.5%  Previously on Omni pod 2017-fall 2020 but stopped after running out of supplies and had problems with the insertion sites.  She is currently on: - Tresiba 12-13 units daily >> at bedtime - NovoLog 2-6 units 3 times a day before meals (1:10 ICR, target 120, ISF 60-65) She tried inhaled insulin in the past but she had a persistently severe cough and had to stop. She tried Glimepiride - not very effective.  She was on the freestyle libre CGM but this was not covered by insurance so we sent a PA for the Dexcom CGM after last visit. Of note, she prefers a sensor without alarms.  However, since last visit, she kept the Dexcom alarms on and they do not bother her.  She checks her sugars more than 4 times a day with her CGM.  CGM parameters: - Average from CGM: 159+/-59 - time CGM attached: 98.3% - Coefficient of variation: 37%  Time in range:  - very low (<54): 0.4% - low (<70): 3% - normal range (70-180): 65% - high sugars (>180): 32% - very high sugars (>250): 7.9%    Lowest sugar was 20-30s before after starting insulin >> more recently 45 >> 52; she has hypoglycemia  awareness in the 52s. I sent a prescription for a glucagon kit at last visit. No previous hypoglycemia admission. She may drop her sugars after exercise -usually exercise earlier in the day. Highest sugar was >600 (pump failed) >> 399. No previous DKA admissions.  Pt's meals are: - Breakfast: Fruit, peanut butter and banana sandwich, oatmeal with fruit - Lunch: [place] and cheese sandwich + pickle - Dinner: Home-cooked family meals - Snacks: 1 to 2 almonds, cheese, fruit, veggies  -+ History of stage III CKD, resolved after stopping NSAIDs; last BUN/creatinine:  No results found for: BUN No results found for: CREATININE  -+ HL; last set of lipids: No results found for: CHOL, HDL, LDLCALC, LDLDIRECT, TRIG, CHOLHDL On Lipitor 20 - moved to every other day.  - last eye exam was in 12/2019: No DR.   -No numbness and tingling in her feet.  No FH of DM.  Hypothyroidism:  Pt is on levothyroxine 112 mcg daily, taken: - in am << moved from evening at last visit - fasting - at least 30 min from b'fast - no calcium - no iron - + multivitamins later in the day - no PPIs - not on Biotin  Reviewed TSH level: 02/09/2019: TSH 0.7474   Pt denies: - feeling nodules in neck - hoarseness - dysphagia - choking - SOB with lying down  She also has a history of anxiety/depression, migraines.  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 She got engaged since last OV.  ROS:  Constitutional: no weight gain/no weight loss, + fatigue, + subjective hyperthermia, no subjective hypothermia Eyes: no blurry vision, no xerophthalmia ENT: no sore throat, + see HPI Cardiovascular: no CP/no SOB/no palpitations/no leg swelling Respiratory: no cough/no SOB/no wheezing Gastrointestinal: no N/no V/no D/no C/no acid reflux Musculoskeletal: + muscle aches/+ joint aches Skin: no rashes, no hair loss Neurological: no tremors/no numbness/no tingling/no dizziness, + HAs  I reviewed pt's medications,  allergies, PMH, social hx, family hx, and changes were documented in the history of present illness. Otherwise, unchanged from my initial visit note.  Past Medical History:  Diagnosis Date  . Anxiety   . Diabetes ([place])   . Femoral hernia 10/2019  . Hypercholesteremia   . Hypothyroid   . MVP (mitral valve prolapse)    Past Surgical History:  Procedure Laterality Date  . BACK SURGERY    . LAPAROSCOPIC LYSIS OF ADHESIONS     Social History   Socioeconomic History  . Marital status: Significant Other    Spouse name: Not on file  . Number of children: 1  . Years of education: Not on file  . Highest education level: Not on file  Occupational History  . Occupation: Hospital pharmacist   Tobacco Use  . Smoking status: Never Smoker  . Smokeless tobacco: Never Used  Vaping Use  . Vaping Use: Never used  Substance and Sexual Activity  . Alcohol use: Yes    Comment: occ, wine or beer  . Drug use: Never  . Sexual activity: Yes    Birth control/protection: None  Other Topics Concern  . Not on file  Social History Narrative  . Not on file   Social Determinants of Health   Financial Resource Strain: Not on file  Food Insecurity: Not on file  Transportation Needs: Not on file  Physical Activity: Not on file  Stress: Not on file  Social Connections: Not on file  Intimate Partner Violence: Not on file   Current Outpatient Medications on File Prior to Visit  Medication Sig Dispense Refill  . atorvastatin (LIPITOR) 20 MG tablet Take 20 mg by mouth daily.    . Continuous Blood Gluc Receiver (DEXCOM G6 RECEIVER) DEVI Use to check blood sugar 4 times daily 1 each 0  . Continuous Blood Gluc Sensor (DEXCOM G6 SENSOR) MISC Use with dexcom to check blood sugar 4 times daily 9 each 3  . Continuous Blood Gluc Transmit (DEXCOM G6 TRANSMITTER) MISC Use with dexcom to check blood sugar 4 times daily 1 each 3  . escitalopram (LEXAPRO) 20 MG tablet as directed.    . Glucagon 3 MG/DOSE POWD  Place 3 mg into the nose once as needed for up to 1 dose. 1 each 11  . insulin aspart (NOVOLOG) 100 UNIT/ML injection Inject into the skin as directed.    . Insulin Degludec (TRESIBA) 100 UNIT/ML SOLN Inject into the skin as directed.    Marland Kitchen levothyroxine (SYNTHROID) 112 MCG tablet Take 112 mcg by mouth daily.    . Multiple Vitamin (MULTIVITAMIN) capsule Take 1 capsule by mouth daily.     No current facility-administered medications on file prior to visit.   Allergies  Allergen Reactions  . Macrobid [Nitrofurantoin] Itching   Family History  Problem Relation Age of Onset  . Hypercholesterolemia Mother     PE: BP 120/78   Pulse 73   Ht $R'5\' 8"'hE$  (1.727 m)   Wt 151 lb (68.5 kg)   LMP 02/07/2016   SpO2 97%   BMI 22.96  kg/m  Wt Readings from Last 3 Encounters:  06/09/20 151 lb (68.5 kg)  03/09/20 145 lb (65.8 kg)  03/07/20 145 lb 3.2 oz (65.9 kg)   Temp Readings from Last 3 Encounters:  No data found for Temp   BP Readings from Last 3 Encounters:  03/09/20 124/86  03/07/20 122/80  02/07/20 124/80   Pulse Readings from Last 3 Encounters:  03/09/20 86           Constitutional: normal weight, in NAD Eyes: PERRLA, EOMI, no exophthalmos ENT: moist mucous membranes, no thyromegaly, no cervical lymphadenopathy Cardiovascular: RRR, No MRG Respiratory: CTA B Gastrointestinal: abdomen soft, NT, ND, BS+ Musculoskeletal: no deformities, strength intact in all 4 Skin: moist, warm, no rashes Neurological: no tremor with outstretched hands, DTR normal in all 4  ASSESSMENT: 1. DM1, fairly well controlled, without long-term complications, but with hyperglycemia -She has a history of CKD stage III, which resolved after stopping NSAIDs  2. Hypothyroidism  PLAN:  1. Patient with longstanding, fairly well-controlled type 1 diabetes, previously on an Omni pod insulin pump, now on basal/bolus insulin regimen after ran out of OmniPod supplies. At last visit, HbA1c was better, at 6.9%. At  that time, we continued the same [place] dose but moved it to am. I also advised him to try to bolus NovoLog 15 minutes before every meal. We did not change the regimen otherwise. -At today's visit, she has a Dexcom CGM, change from a freestyle libre CGM, per insurance preference.  She is not bothered by the [facility] as much as she thought in the past.  For now, she agrees to continue with her Dexcom. CGM interpretation: -At today's visit, we reviewed her CGM downloads: It appears that 65% of values are in target range (goal >70%), while 32% are higher than 180 (goal <25%), and 3.4% are lower than 70 (goal <4%).  The calculated average blood sugar is 159.  The projected HbA1c for the next 3 months (GMI) is 7.1%. -Reviewing the CGM trends, it appears that her sugars are higher after approximately 11 PM, and they stay high until approximately 3 AM, when they start to improve.  Upon questioning, she is having a snack at bedtime, which is an old habit for her.  We discussed that now that she has a CGM with alarms, she can rely on it to tell it whether the sugars are dropping overnight, but in general, she would ideally avoid the snack.  If she absolutely wants to have a snack, I suggested low or no carb alternatives. -Also, her sugars started increasing after breakfast but artificially higher after lunch and they are at goal after dinner.  Upon questioning, she is not bolusing the entire amount of insulin required for certain meals as she is afraid that she may drop.  She is working on bolusing more for meals and I strongly advised her to continue to do so, again, since she does have a CGM on. -She tells me that she is doing better with bolusing her insulin of approximately 15 minutes before meals and she noticed an improvement in her blood sugars after she started to do so as opposed to bolusing at the beginning of the meal or after the meal. -I do not feel we need to change her regimen for now, but we discussed  about adding healthy fats to higher glycemic index meals so that she does not drop her blood sugars afterwards - I suggested to: Patient Instructions  Please continue: - Tresiba 12-13  units at bedtime - NovoLog 2-6 units 3 times a day before meals (1:10 ICR, target 120, ISF 60-65)  Try to eliminate the snack at night or replace it with non-carbohydrate foods.  Try to add healthy fats to higher glycemic index meals.  Please continue to try to inject Novolog 15 min before meals.  Please continue Levothyroxine 112 mcg daily.  Take the thyroid hormone every day, with water, at least 30 minutes before breakfast, separated by at least 4 hours from: - acid reflux medications - calcium - iron - multivitamins  Please stop at the lab.  Please return in 3-4 months with your sugar log.   - we checked her HbA1c: 6.3% (better) - advised to check sugars at different times of the day - 4x a day, rotating check times - advised for yearly eye exams >> she is UTD - return to clinic in 3-4 months  2. Hypothyroidism - latest thyroid labs reviewed with pt >> normal in 01/2019  - she continues on LT4 112 mcg daily - pt feels good on this dose. - we discussed about taking the thyroid hormone every day, with water, >30 minutes before breakfast, separated by >4 hours from acid reflux medications, calcium, iron, multivitamins. Pt. is taking it correctly now, after we moved LT4 from evening to morning at last visit. - will check thyroid tests today: TSH and fT4 - If labs are abnormal, she will need to return for repeat TFTs in 1.5 months  Needs LT4 refills.  Component     Latest Ref Rng & Units 06/09/2020  Sodium     135 - 145 mEq/L 140  Potassium     3.5 - 5.1 mEq/L 4.2  Chloride     96 - 112 mEq/L 103  CO2     19 - 32 mEq/L 32  Glucose     70 - 99 mg/dL 101 (H)  BUN     6 - 23 mg/dL 10  Creatinine     0.40 - 1.20 mg/dL 0.75  Total Bilirubin     0.2 - 1.2 mg/dL 0.6  Alkaline  Phosphatase     39 - 117 U/L 59  AST     0 - 37 U/L 19  ALT     0 - 35 U/L 18  Total Protein     6.0 - 8.3 g/dL 7.3  Albumin     3.5 - 5.2 g/dL 4.5  GFR     >60.00 mL/min 91.20  Calcium     8.4 - 10.5 mg/dL 9.4  Cholesterol     0 - 200 mg/dL 162  Triglycerides     0.0 - 149.0 mg/dL 44.0  HDL Cholesterol     >39.00 mg/dL 73.50  VLDL     0.0 - 40.0 mg/dL 8.8  LDL (calc)     0 - 99 mg/dL 79  Total CHOL/HDL Ratio      2  NonHDL      88.16  Microalb, Ur     0.0 - 1.9 mg/dL <0.7  Creatinine,U     mg/dL 15.3  MICROALB/CREAT RATIO     0.0 - 30.0 mg/g 4.6  Hemoglobin A1C     4.0 - 5.6 % 6.3 (A)  TSH     0.35 - 4.50 uIU/mL 0.36  T4,Free(Direct)     0.60 - 1.60 ng/dL 0.97   Labs are at goal.  TSH is at the lower end of normal, but for now I would suggest to  continue the current dose of levothyroxine and recheck her TFTs at next visit.  Philemon Kingdom, MD PhD [facility]

## 2020-06-09 NOTE — Addendum Note (Signed)
Addended by: Lauralyn Primes on: 06/09/2020 03:20 PM   Modules accepted: Orders

## 2020-06-12 ENCOUNTER — Other Ambulatory Visit: Payer: Self-pay | Admitting: Internal Medicine

## 2020-06-12 MED ORDER — LEVOTHYROXINE SODIUM 112 MCG PO TABS
112.0000 ug | ORAL_TABLET | Freq: Every day | ORAL | 3 refills | Status: DC
Start: 2020-06-12 — End: 2020-07-28

## 2020-06-12 NOTE — Addendum Note (Signed)
Addended by: Philemon Kingdom on: 06/12/2020 08:59 AM   Modules accepted: Orders

## 2020-06-20 ENCOUNTER — Other Ambulatory Visit (HOSPITAL_COMMUNITY): Payer: Self-pay | Admitting: Endocrinology

## 2020-07-11 ENCOUNTER — Ambulatory Visit: Payer: 59

## 2020-07-24 ENCOUNTER — Other Ambulatory Visit: Payer: Self-pay | Admitting: Internal Medicine

## 2020-07-24 ENCOUNTER — Encounter: Payer: Self-pay | Admitting: Internal Medicine

## 2020-07-24 DIAGNOSIS — E039 Hypothyroidism, unspecified: Secondary | ICD-10-CM

## 2020-07-27 ENCOUNTER — Other Ambulatory Visit: Payer: Self-pay

## 2020-07-27 ENCOUNTER — Ambulatory Visit: Payer: 59

## 2020-07-27 ENCOUNTER — Ambulatory Visit
Admission: RE | Admit: 2020-07-27 | Discharge: 2020-07-27 | Disposition: A | Discharge status: Home / Self Care | Payer: 59 | Source: Ambulatory Visit | Attending: Nurse Practitioner | Admitting: Nurse Practitioner

## 2020-07-27 DIAGNOSIS — Z1231 Encounter for screening mammogram for malignant neoplasm of breast: Secondary | ICD-10-CM | POA: Diagnosis not present

## 2020-07-28 ENCOUNTER — Other Ambulatory Visit: Payer: Self-pay | Admitting: Internal Medicine

## 2020-07-28 ENCOUNTER — Other Ambulatory Visit (INDEPENDENT_AMBULATORY_CARE_PROVIDER_SITE_OTHER): Payer: 59

## 2020-07-28 DIAGNOSIS — E039 Hypothyroidism, unspecified: Secondary | ICD-10-CM

## 2020-07-28 LAB — T4, FREE: Free T4: 1.36 ng/dL (ref 0.60–1.60)

## 2020-07-28 LAB — TSH: TSH: 0.27 u[IU]/mL — ABNORMAL LOW (ref 0.35–4.50)

## 2020-07-28 MED ORDER — LEVOTHYROXINE SODIUM 100 MCG PO TABS
100.0000 ug | ORAL_TABLET | Freq: Every day | ORAL | 3 refills | Status: DC
Start: 2020-07-28 — End: 2020-07-28

## 2020-07-31 ENCOUNTER — Other Ambulatory Visit (HOSPITAL_COMMUNITY): Payer: Self-pay | Admitting: Physician Assistant

## 2020-07-31 DIAGNOSIS — F419 Anxiety disorder, unspecified: Secondary | ICD-10-CM | POA: Diagnosis not present

## 2020-07-31 DIAGNOSIS — E039 Hypothyroidism, unspecified: Secondary | ICD-10-CM | POA: Diagnosis not present

## 2020-07-31 DIAGNOSIS — G8929 Other chronic pain: Secondary | ICD-10-CM | POA: Diagnosis not present

## 2020-07-31 DIAGNOSIS — I341 Nonrheumatic mitral (valve) prolapse: Secondary | ICD-10-CM | POA: Diagnosis not present

## 2020-07-31 DIAGNOSIS — M545 Low back pain, unspecified: Secondary | ICD-10-CM | POA: Diagnosis not present

## 2020-07-31 DIAGNOSIS — M25512 Pain in left shoulder: Secondary | ICD-10-CM | POA: Diagnosis not present

## 2020-07-31 DIAGNOSIS — E1165 Type 2 diabetes mellitus with hyperglycemia: Secondary | ICD-10-CM | POA: Diagnosis not present

## 2020-07-31 DIAGNOSIS — E785 Hyperlipidemia, unspecified: Secondary | ICD-10-CM | POA: Diagnosis not present

## 2020-07-31 DIAGNOSIS — S6991XA Unspecified injury of right wrist, hand and finger(s), initial encounter: Secondary | ICD-10-CM | POA: Diagnosis not present

## 2020-08-01 DIAGNOSIS — Z23 Encounter for immunization: Secondary | ICD-10-CM | POA: Diagnosis not present

## 2020-08-04 NOTE — Progress Notes (Signed)
Subjective:    CC: L shoulder pain  I, Molly Weber, LAT, ATC, am serving as scribe for Dr. Lynne Leader.  HPI: Pt is a 55 y/o RHD female presenting w/ c/o L shoulder pain since last summer w/ no known MOI.  She locates her pain to her L superior and lateral shoulder.  The pt is employed as a Dealer.  Radiating pain: yes into her L upper arm L shoulder mechanical symptoms: yes  Aggravating factors: donning/doffing clothes; pushing up off of her L hand/arm; trying to sleep w/ her L arm overhead; end-ranged L shoulder AROM Treatments tried: Avoiding painful movements; Diclofenac 75 mg prn; Tylenol 650mg  prn  Pertinent review of Systems: No fevers or chills  Relevant historical information: Diabetes   Objective:    Vitals:   08/07/20 1031  BP: 110/74  Pulse: 82  SpO2: 98%   General: Well Developed, well nourished, and in no acute distress.   MSK: C-spine normal-appearing Nontender. Normal cervical motion.  Left shoulder normal-appearing Nontender. Range of motion abduction full. External rotation full. Internal rotation lumbar spine. Strength: Abduction 4/5.  External rotation 5/5 internal rotation 5/5. Positive Hawkins and Neer's test.  Positive empty can test. Negative Yergason's and speeds test. Pulses cap refill and sensation are intact distally.  Lab and Radiology Results  X-ray images left shoulder obtained today personally and independently interpreted Shows no fractures, severe degenerative changes or malalignment. Await formal radiology review  Diagnostic Limited MSK Ultrasound of: Left shoulder Biceps tendon intact normal-appearing Subscapularis tendon is intact. Supraspinatus tendon is intact without tear. Small subacromial bursitis. Infraspinatus tendon is intact. AC joint with mild effusion Impression: Mild subacromial bursitis   Impression and Recommendations:    Assessment and Plan: 55 y.o. female with left shoulder pain  thought to be due to rotator cuff tendinopathy and subacromial bursitis.  Plan for physical therapy and home exercise program.  Recommend also Voltaren gel.  Recheck back in about 6 weeks.  Consider steroid injection versus MRI as next If not improved.  PDMP not reviewed this encounter. Orders Placed This Encounter  Procedures  . [place] LIMITED JOINT SPACE STRUCTURES UP LEFT(NO LINKED CHARGES)    Order Specific Question:   Reason for Exam (SYMPTOM  OR DIAGNOSIS REQUIRED)    Answer:   L shoulder pain    Order Specific Question:   Preferred imaging location?    Answer:   [place]  . DG Shoulder Left    Standing Status:   Future    Number of Occurrences:   1    Standing Expiration Date:   08/07/2021    Order Specific Question:   Reason for Exam (SYMPTOM  OR DIAGNOSIS REQUIRED)    Answer:   eval shoulder pain    Order Specific Question:   Is patient pregnant?    Answer:   No    Order Specific Question:   Preferred imaging location?    Answer:   [facility]  . Ambulatory referral to Physical Therapy    Referral Priority:   Routine    Referral Type:   Physical Medicine    Referral Reason:   Specialty Services Required    Requested Specialty:   Physical Therapy   No orders of the defined types were placed in this encounter.   Discussed warning signs or symptoms. Please see discharge instructions. Patient expresses understanding.   The above documentation has been reviewed and is accurate and complete Lynne Leader, M.D.

## 2020-08-07 ENCOUNTER — Ambulatory Visit: Payer: 59 | Admitting: Family Medicine

## 2020-08-07 ENCOUNTER — Ambulatory Visit: Payer: Self-pay

## 2020-08-07 ENCOUNTER — Other Ambulatory Visit: Payer: Self-pay

## 2020-08-07 ENCOUNTER — Ambulatory Visit (INDEPENDENT_AMBULATORY_CARE_PROVIDER_SITE_OTHER): Payer: 59

## 2020-08-07 ENCOUNTER — Encounter: Payer: Self-pay | Admitting: Family Medicine

## 2020-08-07 VITALS — BP 110/74 | HR 82 | Ht 68.0 in | Wt 152.4 lb

## 2020-08-07 DIAGNOSIS — G8929 Other chronic pain: Secondary | ICD-10-CM

## 2020-08-07 DIAGNOSIS — M25512 Pain in left shoulder: Secondary | ICD-10-CM | POA: Diagnosis not present

## 2020-08-07 NOTE — Progress Notes (Signed)
X-ray left shoulder shows some arthritis in the tiny joint of the top of the shoulder.  Otherwise looks normal to radiology.

## 2020-08-07 NOTE — Patient Instructions (Signed)
Thank you for coming in today.  Please get an Xray today before you leave  I've referred you to Physical Therapy.  Let us know if you don't hear from them in one week.  Please use voltaren gel up to 4x daily for pain as needed.   Recheck in 6 weeks.   Let me know if you have a problem.    Shoulder Impingement Syndrome  Shoulder impingement syndrome is a condition that causes pain when connective tissues (tendons) surrounding the shoulder joint become pinched. These tendons are part of the group of muscles and tissues that help to stabilize the shoulder (rotator cuff). Beneath the rotator cuff is a fluid-filled sac (bursa) that allows the muscles and tendons to glide smoothly. The bursa may become swollen or irritated (bursitis). Bursitis, swelling in the rotator cuff tendons, or both conditions can decrease how much space is under a bone in the shoulder joint (acromion), resulting in impingement. What are the causes? Shoulder impingement syndrome may be caused by bursitis or swelling of the rotator cuff tendons, which may result from:  Repetitive overhead arm movements.  Falling onto the shoulder.  Weakness in the shoulder muscles. What increases the risk? You may be more likely to develop this condition if you:  Play sports that involve throwing, such as baseball.  Participate in sports such as tennis, volleyball, and swimming.  Work as a Curator, Games developer, or Architect. Some people are also more likely to develop impingement syndrome because of the shape of their acromion bone. What are the signs or symptoms? The main symptom of this condition is pain on the front or side of the shoulder. The pain may:  Get worse when lifting or raising the arm.  Get worse at night.  Wake you up from sleeping.  Feel sharp when the shoulder is moved and then fade to an ache. Other symptoms may include:  Tenderness.  Stiffness.  Inability to raise the arm above shoulder level or  behind the body.  Weakness. How is this diagnosed? This condition may be diagnosed based on:  Your symptoms and medical history.  A physical exam.  Imaging tests, such as: ? X-rays. ? MRI. ? Ultrasound. How is this treated? This condition may be treated by:  Resting your shoulder and avoiding all activities that cause pain or put stress on the shoulder.  Icing your shoulder.  NSAIDs to help reduce pain and swelling.  One or more injections of medicines to numb the area and reduce inflammation.  Physical therapy.  Surgery. This may be needed if nonsurgical treatments have not helped. Surgery may involve repairing the rotator cuff, reshaping the acromion, or removing the bursa. Follow these instructions at home: Managing pain, stiffness, and swelling  If directed, put ice on the injured area. ? Put ice in a plastic bag. ? Place a towel between your skin and the bag. ? Leave the ice on for 20 minutes, 2-3 times a day.   Activity  Rest and return to your normal activities as told by your health care provider. Ask your health care provider what activities are safe for you.  Do exercises as told by your health care provider. General instructions  Do not use any products that contain nicotine or tobacco, such as cigarettes, e-cigarettes, and chewing tobacco. These can delay healing. If you need help quitting, ask your health care provider.  Ask your health care provider when it is safe for you to drive.  Take over-the-counter and prescription medicines  only as told by your health care provider.  Keep all follow-up visits as told by your health care provider. This is important. How is this prevented?  Give your body time to rest between periods of activity.  Be safe and responsible while being active. This will help you avoid falls.  Maintain physical fitness, including strength and flexibility. Contact a health care provider if:  Your symptoms have not improved  after 1-2 months of treatment and rest.  You cannot lift your arm away from your body. Summary  Shoulder impingement syndrome is a condition that causes pain when connective tissues (tendons) surrounding the shoulder joint become pinched.  The main symptom of this condition is pain on the front or side of the shoulder.  This condition is usually treated with rest, ice, and pain medicines as needed. This information is not intended to replace advice given to you by your health care provider. Make sure you discuss any questions you have with your health care provider. Document Revised: 10/02/2018 Document Reviewed: 12/03/2017 Elsevier Patient Education  2021 [employer].

## 2020-08-08 ENCOUNTER — Other Ambulatory Visit: Payer: Self-pay | Admitting: Internal Medicine

## 2020-08-22 ENCOUNTER — Ambulatory Visit: Payer: 59 | Admitting: Cardiology

## 2020-08-22 ENCOUNTER — Other Ambulatory Visit: Payer: Self-pay

## 2020-08-22 ENCOUNTER — Encounter: Payer: Self-pay | Admitting: Cardiology

## 2020-08-22 VITALS — BP 107/73 | HR 69 | Ht 68.0 in | Wt 151.6 lb

## 2020-08-22 DIAGNOSIS — E1065 Type 1 diabetes mellitus with hyperglycemia: Secondary | ICD-10-CM | POA: Diagnosis not present

## 2020-08-22 DIAGNOSIS — Z8679 Personal history of other diseases of the circulatory system: Secondary | ICD-10-CM

## 2020-08-22 NOTE — Patient Instructions (Signed)
Medication Instructions:  No changes  *If you need a refill on your cardiac medications before your next appointment, please call your pharmacy*   Lab Work:  Not needed   Testing/Procedures: Will be schedule at [facility] has requested that you have an echocardiogram. Echocardiography is a painless test that uses sound waves to create images of your heart. It provides your doctor with information about the size and shape of your heart and how well your heart's chambers and valves are working. This procedure takes approximately one hour. There are no restrictions for this procedure.     Follow-Up: At [facility], you and your health needs are our priority.  As part of our continuing mission to provide you with exceptional heart care, we have created designated Provider Care Teams.  These Care Teams include your primary Cardiologist (physician) and Advanced Practice Providers (APPs -  Physician Assistants and Nurse Practitioners) who all work together to provide you with the care you need, when you need it.     Your next appointment:   12 month(s) ,if Echo is  Abnormal we will see you sooner  The format for your next appointment:   In Person  Provider:   Glenetta Hew, MD   Other Instructions

## 2020-08-22 NOTE — Progress Notes (Signed)
Primary Care Provider: Ephriam Jenkins, PA Cardiologist: Glenetta Hew, MD  -> Previous Cardiologist (Last Seen in September 2012): Dr. Ernestine Mcmurray [facility]): Electrophysiologist: None  Endocrinologist: Benjiman Core, MD   Clinic Note: No chief complaint on file.  ===================================  ASSESSMENT/PLAN   Problem List Items Addressed This Visit    History of mitral valve prolapse in adulthood - Primary (Chronic)    History of A2 leaflet MVP with mild to moderate MR by TEE back in 2012.  Has not been reevaluated since.  Completely asymptomatic with trivial murmur heard on exam.  Plan: Recheck 2D echocardiogram to establish new baseline.      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  Also very good lipid control based on most recent labs.  LDL 79 on moderate dose atorvastatin.  No changes.      Relevant Medications   HUMALOG KWIKPEN 100 UNIT/ML KwikPen   insulin degludec (TRESIBA) 100 UNIT/ML FlexTouch Pen   Other Relevant Orders   ECHOCARDIOGRAM COMPLETE   EKG 12-Lead (Completed)     ===================================  HPI:    Maria Evans is a 55 y.o. female with a PMH of DM, Type I, and MILD MITRAL PROLAPSE with MILD-MODERATE MITRAL REGURGITATION (Dx'd 01/2011) who is being seen today for the reevaluation of MVP/MR and establishment of New CARDIOLOGIST at the request of Ephriam Jenkins, [place].  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   She was previously seen by Dr. Delman Cheadle back in 2012.  She had an echocardiogram done in June 2012 for an abnormal EKG that revealed Moderate MR with suggestion of Anterior Leaflet MVP.  Initial cardiology consultation was February 04, 2011, she was is asymptomatic at that time (was going by her former married name of [name].  She was-> scheduled for TEE and Stress Echo (both revealed below). Her second follow-up visit with Dr. Girtha Rm was September 2012.  She remained asymptomatic.  Plan was to follow-up in 6 months, however she never went back. She is remained stable since.   Recent Hospitalizations: None  She was seen on July 31, 2020 by Maria Leatherwood, PA.  She discussed your history of MVP and requested establishing new cardiologist.  Reviewed  CV studies:    The following studies were reviewed following the visit.: (if available, images/films reviewed: From Epic Chart or Care Everywhere) -> data obtained 2 days after clinic visit -.  PSH updated below. . TRANSESOPHAGEAL ECHOCARDIOGRAM  02/13/2011   [facility] -> Dr. Delman Cheadle [facility]): EF 55 to 60%.  Borderline LA dilation.  Prolapse of A2 scallop of mitral valve-mild MVP with mild to moderate MR.  (2 eccentric jets, posteriorly directed, c/w anterior leaflet pathology))  . TRANSTHORACIC ECHOCARDIOGRAM  12/11/2010   [facility] -> Dr. Delman Cheadle [facility]): EF 35 to 6%.  Mild LA dilation.  Moderate MR-eccentric/possibly directed jet consistent with antilipid pathology.  Mild TR, LP HTN, mild PR.  Recommend TEE.  Marland Kitchen TREADMILL STRESS ECHOCARDIOGRAM     [facility] -> Dr. Delman Cheadle [facility]): Exercised 10 minutes (11 minutes), achieved Max Heart Rate 162 = 92% Max Predicted Heart Rate, clinically and electrocardiographically negative.  Nonspecific ST and T wave changes.  Prestress echo showed EF 55 to  60% with normal wall motion.  Post-Stress: EF 65 to 70% with nl augmentation of all walls.=> NORMAL STRESS [facility]    Interval History:   Maria Evans presents here today basically establish cardiology care given her prior history of mitral prolapse.  She has not  been evaluated since 2012.  She has remained asymptomatic despite being diagnosed with mitral prolapse back in 2005.  She is very active, walks 2 to 3 miles a day outside.  She jogs a lose part of it.  She also enjoys a activity such as hiking.    In the past few years, she has put on palpitations here and there.  They usually clear up with a cough.  They have improved dramatically since she is cut down her caffeine intake.  Otherwise, she has no cardiac symptoms other than some mild swelling that occurs if she is on her feet all day long, or prolonged travels.  CV Review of Symptoms (Summary) Cardiovascular ROS: no chest pain or dyspnea on exertion positive for - edema, irregular heartbeat, palpitations, rapid heart rate and Patient notably improved with lifestyle modification-reducing caffeine and sweets, as well as less social stress negative for - orthopnea, paroxysmal nocturnal dyspnea, shortness of breath or Lightheadedness or dizziness, syncope/near syncope or TIA amaurosis fugax, claudication  The patient does not have symptoms concerning for COVID-19 infection (fever, chills, cough, or new shortness of breath).   REVIEWED OF SYSTEMS   Review of Systems  Constitutional: Negative for malaise/fatigue and weight loss.  HENT: Negative for congestion and sinus pain.   Respiratory: Negative for cough, shortness of breath and wheezing.   Gastrointestinal: Negative for blood in stool and melena.  Genitourinary: Negative for hematuria.  Neurological: Negative for dizziness, focal weakness, loss of consciousness and weakness.  Psychiatric/Behavioral: Negative.        She seems quite content and happy with her new life here in [place].   I have reviewed and (if needed) personally updated the patient's problem list, medications, allergies, past medical and surgical history, social and family history.   PAST MEDICAL HISTORY   Past Medical History:  Diagnosis Date  . Anxiety   . Anxiety  with depression    Controlled.  . Common migraine without aura   . Diabetes mellitus type I, controlled ([place])    Treated with 4 times daily insulin based on blood sugar monitoring  . Femoral hernia 10/2019  . Hyperlipidemia due to type 1 diabetes mellitus (HCC)    Currently on atorvastatin.  Marland Kitchen Hypothyroid   . MVP (mitral valve prolapse) 2005   Renally diagnosed back in 2005 (during her pregnancy)_, most recently evaluated in 2012.  ([facility], Dr. Delman Cheadle); 11/2010 TTE: Moderate MR, posteriorly directed (recommend TEE) => TEE (01/2011) - mild MVP (anterior leaflet) with Mild-Mod MR.;     PAST SURGICAL HISTORY   Past Surgical History:  Procedure Laterality Date  . LAPAROSCOPIC LYSIS OF ADHESIONS    . LUMBAR MICRODISCECTOMY    . TRANSESOPHAGEAL ECHOCARDIOGRAM  02/13/2011   [facility] -> Dr. Delman Cheadle [facility]): EF 55 to 60%.  Borderline LA dilation.  Prolapse of A2 scallop of mitral valve-mild MVP with mild to moderate MR.  (2 eccentric jets, posteriorly directed, c/w anterior leaflet pathology))  . TRANSTHORACIC ECHOCARDIOGRAM  12/11/2010   [facility] -> Dr. Delman Cheadle [facility]): EF 35 to 6%.  Mild LA dilation.  Moderate MR-eccentric/possibly directed jet consistent with antilipid pathology.  Mild  TR, LP HTN, mild PR.  Recommend TEE.  Marland Kitchen TREADMILL STRESS ECHOCARDIOGRAM     [facility] -> Dr. Delman Cheadle [facility]): Exercised 10 minutes (11 minutes), achieved Max Heart Rate 162 = 92% Max Predicted Heart Rate, clinically and electrocardiographically negative.  Nonspecific ST and T wave changes.  Prestress echo showed EF 55 to 60% with normal wall motion.  Post-Stress: EF 65 to 70% with nl augmentation of all walls.=> NORMAL STRESS [facility]    Immunization History  Administered Date(s) Administered  . PFIZER(Purple Top)SARS-COV-2 Vaccination  04/10/2020    MEDICATIONS/ALLERGIES   Current Meds  Medication Sig  . ALPRAZolam (XANAX) 0.5 MG tablet Take 0.5 mg by mouth as needed for anxiety or sleep.  Marland Kitchen atorvastatin (LIPITOR) 20 MG tablet Take 1 tablet (20 mg total) by mouth daily.  . Continuous Blood Gluc Receiver (DEXCOM G6 RECEIVER) DEVI Use to check blood sugar 4 times daily  . Continuous Blood Gluc Sensor (DEXCOM G6 SENSOR) MISC Use with dexcom to check blood sugar 4 times daily  . Continuous Blood Gluc Transmit (DEXCOM G6 TRANSMITTER) MISC Use with dexcom to check blood sugar 4 times daily  . diclofenac (VOLTAREN) 75 MG EC tablet Take 75 mg by mouth 2 (two) times daily.  Marland Kitchen escitalopram (LEXAPRO) 20 MG tablet Take 20 mg by mouth daily.  . fluocinonide gel (LIDEX) 0.34 % Apply 1 application topically as needed (cold sores).  . Glucagon 3 MG/DOSE POWD Place 3 mg into the nose once as needed for up to 1 dose.  Marland Kitchen HUMALOG KWIKPEN 100 UNIT/ML KwikPen Inject 2-4 Units into the skin 4 (four) times daily.  Marland Kitchen ibuprofen (ADVIL) 800 MG tablet Take 800 mg by mouth every 8 (eight) hours as needed.  . insulin degludec (TRESIBA) 100 UNIT/ML FlexTouch Pen Inject 12 Units into the skin daily.  Marland Kitchen levothyroxine (SYNTHROID) 100 MCG tablet Take 1 tablet (100 mcg total) by mouth daily before breakfast.  . Multiple Vitamin (MULTIVITAMIN) capsule Take 1 capsule by mouth daily.  Marland Kitchen UNIFINE PENTIPS 31G X 5 MM MISC USE AS DIRECTED IWTH TRESIBA  . valACYclovir (VALTREX) 1000 MG tablet Take 1,000 mg by mouth as needed (fever blisters).    Allergies  Allergen Reactions  . Nitrofurantoin Itching and Other (See Comments)    SOCIAL HISTORY/FAMILY HISTORY   Reviewed in Epic:  Pertinent findings:  Social History   Tobacco Use  . Smoking status: Never Smoker  . Smokeless tobacco: Never Used  Vaping Use  . Vaping Use: Never used  Substance Use Topics  . Alcohol use: Yes    Comment: occ, wine or beer  . Drug use: Never   Social History   Social  History Narrative   She is currently divorced mother of 55 (81 year old son).   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    She is now with her longtime high school significant other after completing the divorce from her first husband.     She and her fianc intend to elope probably in May or June of this year.      She is an avid exerciser.  Enjoys jogging and walking.  She does not list 2 miles with treadmill and walks about 45 minutes most days - either indoors on the treadmill or outside.         Family History family history includes CAD  in her paternal grandmother; Esophageal cancer in her maternal grandfather; Heart attack (age of onset: 75) in her paternal grandmother; Hyperlipidemia in her father and mother; Stroke in her paternal grandmother.   OBJCTIVE -PE, EKG, labs   Wt Readings from Last 3 Encounters:  08/22/20 151 lb 9.6 oz (68.8 kg)  08/07/20 152 lb 6.4 oz (69.1 kg)  06/09/20 151 lb (68.5 kg)    Physical Exam: BP 107/73   Pulse 69   Ht 5\' 8"  (1.727 m)   Wt 151 lb 9.6 oz (68.8 kg)   LMP 02/07/2016   SpO2 99%   BMI 23.05 kg/m  Physical Exam Vitals reviewed.  Constitutional:      General: She is not in acute distress.    Appearance: Normal appearance. She is normal weight. She is not ill-appearing or toxic-appearing.     Comments: Healthy-appearing.  Well-groomed.  HENT:     Head: Normocephalic and atraumatic.  Cardiovascular:     Rate and Rhythm: Normal rate and regular rhythm.  No extrasystoles are present.    Chest Wall: PMI is not displaced.     Pulses: Normal pulses. A midsystolic click (CRO soft click vs. split S2).     Heart sounds: Murmur heard.  High-pitched blowing mid to late systolic murmur is present with a grade of 1/6 at the apex. No friction rub. No gallop.   Pulmonary:     Effort: Pulmonary  effort is normal. No respiratory distress.     Breath sounds: Normal breath sounds.  Chest:     Chest wall: No tenderness.  Musculoskeletal:        General: No swelling. Normal range of motion.     Cervical back: Normal range of motion and neck supple.  Lymphadenopathy:     Cervical: No cervical adenopathy.  Skin:    General: Skin is warm and dry.     Coloration: Skin is not pale.  Neurological:     General: No focal deficit present.     Mental Status: She is oriented to person, place, and time.     Cranial Nerves: No cranial nerve deficit (Grossly intact).     Gait: Gait normal.  Psychiatric:        Mood and Affect: Mood normal.        Behavior: Behavior normal.        Thought Content: Thought content normal.        Judgment: Judgment normal.     Comments: Pleasant     Adult ECG Report  Rate: 69 ;  Rhythm: normal sinus rhythm, sinus arrhythmia and Cannot exclude septal MI, age-indeterminate.  More consistent with poor R wave progression.;   Narrative Interpretation: Stable  Recent Labs: Reviewed Lab Results  Component Value Date   CHOL 162 06/09/2020   HDL 73.50 06/09/2020   LDLCALC 79 06/09/2020   TRIG 44.0 06/09/2020   CHOLHDL 2 06/09/2020   Lab Results  Component Value Date   CREATININE 0.75 06/09/2020   BUN 10 06/09/2020   NA 140 06/09/2020   K 4.2 06/09/2020   CL 103 06/09/2020   CO2 32 06/09/2020   No flowsheet data found.  Lab Results  Component Value Date   TSH 0.27 (L) 07/28/2020    ==================================================  COVID-19 Education: The signs and symptoms of COVID-19 were discussed with the patient and how to seek care for testing (follow up with PCP or arrange E-visit).   The importance of social distancing and COVID-19 vaccination was discussed today. The  patient is practicing social distancing & Masking.   I spent a total of 65minutes with the patient spent in direct patient consultation.  Additional time spent with chart  review  / charting (studies, outside notes, etc): 25 min -> Shortly after the visit, we were able to get her records from her previous cardiologist.  This note was completed after reviewing the data for sake of completeness. Total Time: 53 min  Current medicines are reviewed at length with the patient today.  (+/- concerns) none  This visit occurred during the SARS-CoV-2 public health emergency.  Safety protocols were in place, including screening questions prior to the visit, additional usage of staff PPE, and extensive cleaning of exam room while observing appropriate contact time as indicated for disinfecting solutions.  Notice: This dictation was prepared with Dragon dictation along with smaller phrase technology. Any transcriptional errors that result from this process are unintentional and may not be corrected upon review.  Patient Instructions / Medication Changes & Studies & Tests Ordered   Patient Instructions  Medication Instructions:  No changes  *If you need a refill on your cardiac medications before your next appointment, please call your pharmacy*   Lab Work:  Not needed   Testing/Procedures: Will be schedule at [place] has requested that you have an echocardiogram. Echocardiography is a painless test that uses sound waves to create images of your heart. It provides your doctor with information about the size and shape of your heart and how well your heart's chambers and valves are working. This procedure takes approximately one hour. There are no restrictions for this procedure.     Follow-Up: At [facility], you and your health needs are our priority.  As part of our continuing mission to provide you with exceptional heart care, we have created designated Provider Care Teams.  These Care Teams include your primary Cardiologist (physician) and Advanced Practice Providers (APPs -  Physician Assistants and Nurse Practitioners)  who all work together to provide you with the care you need, when you need it.     Your next appointment:   12 month(s) ,if Echo is  Abnormal we will see you sooner  The format for your next appointment:   In Person  Provider:   Glenetta Hew, MD   Other Instructions     Studies Ordered:   Orders Placed This Encounter  Procedures  . EKG 12-Lead  . ECHOCARDIOGRAM COMPLETE     Glenetta Hew, M.D., M.S. Interventional Cardiologist   Pager # [phone] Phone # [phone] [address] [place], [place] [address]   Thank you for choosing Heartcare at [facility]!!

## 2020-08-23 ENCOUNTER — Ambulatory Visit: Payer: 59 | Admitting: Physical Therapy

## 2020-08-23 ENCOUNTER — Encounter: Payer: Self-pay | Admitting: Physical Therapy

## 2020-08-23 DIAGNOSIS — M25512 Pain in left shoulder: Secondary | ICD-10-CM

## 2020-08-23 DIAGNOSIS — M6281 Muscle weakness (generalized): Secondary | ICD-10-CM

## 2020-08-23 DIAGNOSIS — M25612 Stiffness of left shoulder, not elsewhere classified: Secondary | ICD-10-CM

## 2020-08-23 DIAGNOSIS — R293 Abnormal posture: Secondary | ICD-10-CM | POA: Diagnosis not present

## 2020-08-23 DIAGNOSIS — R600 Localized edema: Secondary | ICD-10-CM

## 2020-08-23 NOTE — Therapy (Signed)
[facility] Physical Therapy [address] [place], [place], [address] Phone: [phone]   Fax:  [phone]  Physical Therapy Evaluation  Patient Details  Name: Maria Evans MRN: [id number] Date of Birth: 04/18/1966 Referring Provider (PT): Lynne Leader, MD   Encounter Date: 08/23/2020   PT End of Session - 08/23/20 2146    Visit Number 1    Number of Visits 8    Date for PT Re-Evaluation 09/28/20    Authorization Type [place] [employer]    Authorization Time Period $20 copay    PT Start Time 0930    PT Stop Time 1016    PT Time Calculation (min) 46 min    Activity Tolerance Patient tolerated treatment well;Patient limited by pain    Behavior During Therapy [facility] for tasks assessed/performed           Past Medical History:  Diagnosis Date  . Anxiety   . Diabetes ([place])   . Femoral hernia 10/2019  . Hypercholesteremia   . Hypothyroid   . MVP (mitral valve prolapse)     Past Surgical History:  Procedure Laterality Date  . BACK SURGERY    . LAPAROSCOPIC LYSIS OF ADHESIONS      There were no vitals filed for this visit.    Subjective Assessment - 08/23/20 0936    Subjective This 55yo female was referred to PT by Lynne Leader, MD with [phone] (ICD-10-CM) - Chronic left shoulder pain. She has been experiencing pain since last summer with no known MOI. On 08/07/2020 Dr. Georgina Snell assessed left shoulder pain thought to be due to rotator cuff tendinopathy and subacromial bursitis.    Pertinent History IDDM1 adult onset, mitral valve prolapse, back surgery 2012 L4-5 discectomy    Limitations Lifting    Patient Stated Goals to use left shoulder for activity, full movement, return to sailing & stand up paddle board.    Currently in Pain? Yes    Pain Score 1    in last week, worst 5/10 (settles after few minutes) and best 0/10   Pain Location Shoulder    Pain Orientation Left;Lateral    Pain Descriptors / Indicators Sharp;Aching    Pain Type Chronic pain     Pain Onset More than a month ago    Pain Frequency Intermittent    Aggravating Factors  raising arm overhead and rotation both internal & external    Pain Relieving Factors stopping activiity, supporting arm, meds 2x/day if needed    Effect of Pain on Daily Activities cautious & limits rotation              [facility] PT Assessment - 08/23/20 0930      Assessment   Medical Diagnosis Left shoulder pain    Referring Provider (PT) Lynne Leader, MD    Onset Date/Surgical Date 08/07/20   MD referral to PT   Hand Dominance Right    Prior Therapy none for shoulder      Precautions   Precautions None      Restrictions   Weight Bearing Restrictions No      Balance Screen   Has the patient fallen in the past 6 months No    Has the patient had a decrease in activity level because of a fear of falling?  No    Is the patient reluctant to leave their home because of a fear of falling?  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#,    Leisure sailing, sailboat      Observation/Other Assessments   Focus on Therapeutic Outcomes (FOTO)  53.34% functional   target 64%     Posture/Postural Control   Posture/Postural Control Postural limitations    Postural Limitations Rounded Shoulders;Forward head      ROM / Strength   AROM / PROM / Strength AROM;PROM;Strength      AROM   AROM Assessment Site Shoulder    Right/Left Shoulder Right;Left    Right Shoulder Flexion 172 Degrees    Right Shoulder ABduction 171 Degrees    Right Shoulder Internal Rotation 70 Degrees    Right Shoulder External Rotation 79 Degrees    Right Shoulder  Horizontal ABduction 29 Degrees    Right Shoulder Horizontal  ADduction 126 Degrees    Left Shoulder Flexion 121 Degrees   end feel painful   Left Shoulder ABduction 116 Degrees   painful end feel   Left Shoulder Horizontal ABduction 2 Degrees   painful end feel   Left Shoulder Horizontal ADduction 102 Degrees   painful end feel     PROM   PROM Assessment Site Shoulder    Right/Left Shoulder Right;Left    Right Shoulder Flexion --    Right Shoulder ABduction --    Right Shoulder Internal Rotation --    Right Shoulder External Rotation --    Right Shoulder Horizontal ABduction --    Right Shoulder Horizontal  ADduction --    Left Shoulder Flexion 101 Degrees   supine   Left Shoulder ABduction 104 Degrees   supine   Left Shoulder Internal Rotation 24 Degrees   supine   Left Shoulder External Rotation 30 Degrees   supine     Strength   Overall Strength Comments left scapula weakness with winging during shoulder movements    Strength Assessment Site Shoulder    Right/Left Shoulder Right;Left    Left Shoulder Flexion 4-/5    Left Shoulder ABduction 4/5    Left Shoulder Internal Rotation 3+/5    Left Shoulder External Rotation 3+/5      Palpation   Palpation comment tenderness at subacrominal bursa.                      Objective measurements completed on examination: See above findings.               PT Education - 08/23/20 1015    Education Details Medbridge Access Code: [email or web address]    Person(s) Educated Patient    Methods Explanation;Demonstration;Tactile cues;Verbal cues;Handout    Comprehension Verbalized understanding;Returned demonstration;Verbal cues required;Tactile cues required;Need further instruction               PT Long Term Goals - 08/23/20 1641      PT LONG TERM GOAL #1   Title FOTO 64% functional    Time 4    Period Weeks    Status New    Target Date 09/28/20      PT LONG TERM GOAL #2   Title left shoulder AROM within 10*  of  Right shoulder    Time 4    Period Weeks    Status New    Target Date 09/28/20      PT LONG TERM GOAL #3   Title Patient reports left shoulder pain </= 2/10 with activities    Time 4    Period Weeks    Status New    Target Date 09/28/20      PT LONG TERM GOAL #4   Title Patient demonstrates lifting & functional activities with proper posture & stability.    Time 4    Period Weeks    Status New    Target Date 09/28/20                  Plan - 08/23/20 2149    Clinical Impression Statement This 55yo female has been experiencing progressively increasing pain in left nondominant shoulder that limits activities including sleeping.  She has decreased PROM, AROM & strength of left shoulder. She has impaired posture with head forward and rounded shoulders impairing alignment of shoulder muscles for function with impingement syndrome. She has tenderness to anterior & lateral shoulder. Patient would benefit from skilled PT to improve function of shoulder with decreased pain.    Examination-Activity Limitations Carry;Lift;Reach Overhead    Examination-Participation Restrictions Community Activity;Occupation    Stability/Clinical Decision Making Stable/Uncomplicated    Clinical Decision Making Low    Rehab Potential Good    PT Frequency 2x / week    PT Duration 4 weeks    PT Treatment/Interventions ADLs/Self Care Home Management;Cryotherapy;Electrical Stimulation;Iontophoresis 4mg /ml Dexamethasone;Ultrasound;DME Instruction;Therapeutic activities;Therapeutic exercise;Neuromuscular re-education;Patient/family education;Manual techniques;Passive range of motion;Dry needling;Taping;Vasopneumatic Device;Joint Manipulations    PT Next Visit Plan update HEP, manual therapy & therapeutic exercise to left shoulder, modalities for pain    PT Home Exercise Plan Access Code: [email or web address]    Consulted and Agree with Plan of Care Patient           Patient will benefit from skilled therapeutic  intervention in order to improve the following deficits and impairments:  Decreased range of motion,Decreased strength,Increased edema,Impaired UE functional use,Postural dysfunction,Pain  Visit Diagnosis: Acute pain of left shoulder  Stiffness of left shoulder, not elsewhere classified  Muscle weakness (generalized)  Localized edema  Abnormal posture     Problem List Patient Active Problem List   Diagnosis Date Noted  . Type 1 diabetes mellitus with hyperglycemia ([place]) 08/22/2020  . History of mitral valve prolapse in adulthood 08/23/2010    Jamey Reas, PT, DPT 08/23/2020, 10:00 PM  [facility] Physical Therapy [address] [place], [place], [address] Phone: [phone]   Fax:  [phone]  Name: Maria Evans MRN: [id number] Date of Birth: 04-May-1966

## 2020-08-23 NOTE — Patient Instructions (Signed)
Access Code: [email or web address] URL: [email or web address] Date: 08/23/2020 Prepared by: Jamey Reas  Exercises Supine Chin Tuck - 1-3 x daily - 7 x weekly - 2 sets - 10 reps - 5 seconds hold Supine Scapular Retraction - 1-3 x daily - 7 x weekly - 2 sets - 10 reps - 5 seconds hold Supine Shoulder Flexion Extension Full Range AROM - 1-3 x daily - 7 x weekly - 2 sets - 10 reps - 5 seconds hold Supine Shoulder External and Internal Rotation in Abduction with Dumbbell - 1-3 x daily - 7 x weekly - 2 sets - 10 reps - 5 seconds hold

## 2020-08-25 ENCOUNTER — Emergency Department (HOSPITAL_COMMUNITY)
Admission: EM | Admit: 2020-08-25 | Discharge: 2020-08-25 | Discharge status: Against Medical Advice | Payer: 59 | Attending: Emergency Medicine | Admitting: Emergency Medicine

## 2020-08-25 ENCOUNTER — Encounter (HOSPITAL_COMMUNITY): Payer: Self-pay

## 2020-08-25 ENCOUNTER — Other Ambulatory Visit: Payer: Self-pay

## 2020-08-25 DIAGNOSIS — S0192XA Laceration with foreign body of unspecified part of head, initial encounter: Secondary | ICD-10-CM | POA: Diagnosis not present

## 2020-08-25 DIAGNOSIS — Z5321 Procedure and treatment not carried out due to patient leaving prior to being seen by health care provider: Secondary | ICD-10-CM

## 2020-08-25 DIAGNOSIS — W228XXA Striking against or struck by other objects, initial encounter: Secondary | ICD-10-CM | POA: Insufficient documentation

## 2020-08-25 NOTE — ED Provider Notes (Cosign Needed)
Pt left prior to my evaluation. I have not personally seen this pt.    Franchot Heidelberg, PA-C 08/25/20 2328

## 2020-08-25 NOTE — ED Notes (Signed)
Pt left prior to PA seeing pt.

## 2020-08-25 NOTE — ED Triage Notes (Signed)
Hit head on cabinet leaving work. Approx 1cm lac on right side of head.

## 2020-08-26 ENCOUNTER — Encounter: Payer: Self-pay | Admitting: Cardiology

## 2020-08-26 NOTE — Assessment & Plan Note (Signed)
History of A2 leaflet MVP with mild to moderate MR by TEE back in 2012.  Has not been reevaluated since.  Completely asymptomatic with trivial murmur heard on exam.  Plan: Recheck 2D echocardiogram to establish new baseline.

## 2020-08-26 NOTE — Assessment & Plan Note (Signed)
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  Also very good lipid control based on most recent labs.  LDL 79 on moderate dose atorvastatin.  No changes.

## 2020-08-28 ENCOUNTER — Ambulatory Visit: Payer: 59 | Admitting: Physical Therapy

## 2020-08-28 ENCOUNTER — Other Ambulatory Visit: Payer: Self-pay | Admitting: Internal Medicine

## 2020-08-28 ENCOUNTER — Other Ambulatory Visit: Payer: Self-pay

## 2020-08-28 ENCOUNTER — Encounter: Payer: Self-pay | Admitting: Internal Medicine

## 2020-08-28 ENCOUNTER — Encounter: Payer: Self-pay | Admitting: Physical Therapy

## 2020-08-28 DIAGNOSIS — M25612 Stiffness of left shoulder, not elsewhere classified: Secondary | ICD-10-CM | POA: Diagnosis not present

## 2020-08-28 DIAGNOSIS — M25512 Pain in left shoulder: Secondary | ICD-10-CM

## 2020-08-28 DIAGNOSIS — R293 Abnormal posture: Secondary | ICD-10-CM

## 2020-08-28 DIAGNOSIS — R600 Localized edema: Secondary | ICD-10-CM | POA: Diagnosis not present

## 2020-08-28 DIAGNOSIS — M6281 Muscle weakness (generalized): Secondary | ICD-10-CM

## 2020-08-28 DIAGNOSIS — E1065 Type 1 diabetes mellitus with hyperglycemia: Secondary | ICD-10-CM

## 2020-08-28 MED ORDER — GLUCOSE BLOOD VI STRP: ORAL_STRIP | 1 refills | Status: DC

## 2020-08-28 NOTE — Patient Instructions (Signed)
Access Code: [email or web address] URL: [email or web address] Date: 08/28/2020 Prepared by: Elsie Ra  Exercises Supine Chin Tuck - 1-3 x daily - 7 x weekly - 2 sets - 10 reps - 5 seconds hold Standing Row with Anchored Resistance - 2 x daily - 6 x weekly - 2-3 sets - 10 reps Shoulder extension with resistance - Neutral - 2 x daily - 6 x weekly - 10 reps - 2-3 sets Shoulder External Rotation with Anchored Resistance - 2 x daily - 6 x weekly - 10 reps - 2-3 sets Shoulder Internal Rotation with Resistance - 2 x daily - 6 x weekly - 10 reps - 2-3 sets

## 2020-08-28 NOTE — Therapy (Signed)
[facility] Physical Therapy [address] [place], [place], [address] Phone: [phone]   Fax:  [phone]  Physical Therapy Treatment  Patient Details  Name: VENNA BERBERICH MRN: [id number] Date of Birth: 1965/07/24 Referring Provider (PT): Lynne Leader, MD   Encounter Date: 08/28/2020   PT End of Session - 08/28/20 1637    Visit Number 2    Number of Visits 8    Date for PT Re-Evaluation 09/28/20    Authorization Type [place] [employer]    Authorization Time Period $20 copay    PT Start Time 1600    PT Stop Time 1640    PT Time Calculation (min) 40 min    Activity Tolerance Patient tolerated treatment well    Behavior During Therapy [facility] for tasks assessed/performed           Past Medical History:  Diagnosis Date  . Anxiety   . Anxiety with depression    Controlled.  . Common migraine without aura   . Diabetes mellitus type I, controlled ([place])    Treated with 4 times daily insulin based on blood sugar monitoring  . Femoral hernia 10/2019  . Hyperlipidemia due to type 1 diabetes mellitus ([facility])    Currently on atorvastatin.  Marland Kitchen Hypothyroid   . MVP (mitral valve prolapse) 2005   Renally diagnosed back in 2005 (during her pregnancy)_, most recently evaluated in 2012.  ([facility], Dr. Delman Cheadle); 11/2010 TTE: Moderate MR, posteriorly directed (recommend TEE) => TEE (01/2011) - mild MVP (anterior leaflet) with Mild-Mod MR.;     Past Surgical History:  Procedure Laterality Date  . LAPAROSCOPIC LYSIS OF ADHESIONS    . LUMBAR MICRODISCECTOMY    . TRANSESOPHAGEAL ECHOCARDIOGRAM  02/13/2011   [facility] -> Dr. Delman Cheadle [facility]): EF 55 to 60%.  Borderline LA dilation.  Prolapse of A2 scallop of mitral valve-mild MVP with mild to moderate MR.  (2 eccentric jets, posteriorly directed, c/w anterior leaflet pathology))  . TRANSTHORACIC ECHOCARDIOGRAM  12/11/2010   [facility] -> Dr. Delman Cheadle [facility]): EF 35 to 6%.  Mild LA dilation.  Moderate MR-eccentric/possibly directed jet consistent with antilipid pathology.  Mild TR, LP HTN, mild PR.  Recommend TEE.  Marland Kitchen TREADMILL STRESS ECHOCARDIOGRAM     [facility] -> Dr. Delman Cheadle [facility]): Exercised 10 minutes (11 minutes), achieved Max Heart Rate 162 = 92% Max Predicted Heart Rate, clinically and electrocardiographically negative.  Nonspecific ST and T wave changes.  Prestress echo showed EF 55 to 60% with normal wall motion.  Post-Stress: EF 65 to 70% with nl augmentation of all walls.=> NORMAL STRESS [facility]    There were no vitals filed for this visit.   Subjective Assessment - 08/28/20 1633    Subjective She relays the shoulder exercises are helping alot. She does not have pain to report upon coming in at rest, but she does still have some pain with reaching into ER/IR.    Pertinent History IDDM1 adult onset, mitral valve prolapse, back surgery 2012 L4-5 discectomy    Limitations Lifting    Patient Stated Goals to use left shoulder for activity, full movement, return to sailing & stand up paddle board.    Pain Onset More than a month ago                             [facility] Adult PT Treatment/Exercise - 08/28/20 0001  Exercises   Exercises Shoulder      Shoulder Exercises: Standing   External Rotation Left    Theraband Level (Shoulder External Rotation) Level 2 (Red)    External Rotation Weight (lbs) 2 sets of 10    Internal Rotation Left    Theraband Level (Shoulder Internal Rotation) Level 2 (Red)    Internal Rotation Weight (lbs) 2 sets of 10    Extension Both    Theraband Level (Shoulder Extension) Level 2 (Red)    Extension Limitations 2 sets of 10    Row Both    Theraband Level (Shoulder Row) Level 3 (Green)    Row Limitations 2 sets of 10      Shoulder Exercises: Pulleys   Flexion 2 minutes    ABduction 2  minutes      Shoulder Exercises: ROM/Strengthening   UBE (Upper Arm Bike) 3 min fwd, 3 min retro      Modalities   Modalities Vasopneumatic      Vasopneumatic   Number Minutes Vasopneumatic  10 minutes    Vasopnuematic Location  Shoulder    Vasopneumatic Pressure Medium    Vasopneumatic Temperature  34                  PT Education - 08/28/20 1636    Education Details HEP proression, same access code, [email or web address]    Person(s) Educated Patient    Methods Explanation;Demonstration;Verbal cues;Handout    Comprehension Verbalized understanding;Returned demonstration;Need further instruction               PT Long Term Goals - 08/23/20 1641      PT LONG TERM GOAL #1   Title FOTO 64% functional    Time 4    Period Weeks    Status New    Target Date 09/28/20      PT LONG TERM GOAL #2   Title left shoulder AROM within 10* of Right shoulder    Time 4    Period Weeks    Status New    Target Date 09/28/20      PT LONG TERM GOAL #3   Title Patient reports left shoulder pain </= 2/10 with activities    Time 4    Period Weeks    Status New    Target Date 09/28/20      PT LONG TERM GOAL #4   Title Patient demonstrates lifting & functional activities with proper posture & stability.    Time 4    Period Weeks    Status New    Target Date 09/28/20                 Plan - 08/28/20 1638    Clinical Impression Statement She has made good progress since eval with pain levels, and now has full ROM in her left shoulder. Progressed her strengthening home exercise program today and gave her resistance band and provided new handout. We will further assess her response to new exercises next visit.    Examination-Activity Limitations Carry;Lift;Reach Overhead    Examination-Participation Restrictions Community Activity;Occupation    Stability/Clinical Decision Making Stable/Uncomplicated    Rehab Potential Good    PT Frequency 2x / week    PT Duration 4 weeks    PT  Treatment/Interventions ADLs/Self Care Home Management;Cryotherapy;Electrical Stimulation;Iontophoresis 4mg /ml Dexamethasone;Ultrasound;DME Instruction;Therapeutic activities;Therapeutic exercise;Neuromuscular re-education;Patient/family education;Manual techniques;Passive range of motion;Dry needling;Taping;Vasopneumatic Device;Joint Manipulations    PT Next Visit Plan update HEP, manual therapy & therapeutic exercise to left shoulder, modalities for  pain    PT Home Exercise Plan Access Code: [email or web address]    Consulted and Agree with Plan of Care Patient           Patient will benefit from skilled therapeutic intervention in order to improve the following deficits and impairments:  Decreased range of motion,Decreased strength,Increased edema,Impaired UE functional use,Postural dysfunction,Pain  Visit Diagnosis: Acute pain of left shoulder  Stiffness of left shoulder, not elsewhere classified  Muscle weakness (generalized)  Localized edema  Abnormal posture     Problem List Patient Active Problem List   Diagnosis Date Noted  . Type 1 diabetes mellitus with hyperglycemia ([place]) 08/22/2020  . History of mitral valve prolapse in adulthood 08/23/2010    Silvestre Mesi 08/28/2020, 4:43 PM  [facility] Physical Therapy [address] [place], [place], [address] Phone: [phone]   Fax:  [phone]  Name: LEGNA MAUSOLF MRN: [id number] Date of Birth: 04/19/1966

## 2020-09-04 ENCOUNTER — Encounter: Payer: Self-pay | Admitting: Physical Therapy

## 2020-09-04 ENCOUNTER — Ambulatory Visit: Payer: 59 | Admitting: Physical Therapy

## 2020-09-04 ENCOUNTER — Other Ambulatory Visit: Payer: Self-pay

## 2020-09-04 DIAGNOSIS — R600 Localized edema: Secondary | ICD-10-CM

## 2020-09-04 DIAGNOSIS — R293 Abnormal posture: Secondary | ICD-10-CM | POA: Diagnosis not present

## 2020-09-04 DIAGNOSIS — M25512 Pain in left shoulder: Secondary | ICD-10-CM

## 2020-09-04 DIAGNOSIS — M6281 Muscle weakness (generalized): Secondary | ICD-10-CM

## 2020-09-04 DIAGNOSIS — M25612 Stiffness of left shoulder, not elsewhere classified: Secondary | ICD-10-CM

## 2020-09-04 NOTE — Therapy (Signed)
[facility] Physical Therapy [address] [place], [place], [address] Phone: [phone]   Fax:  [phone]  Physical Therapy Treatment  Patient Details  Name: CHAQUANA NICHOLS MRN: [id number] Date of Birth: 07-01-1965 Referring Provider (PT): Lynne Leader, MD   Encounter Date: 09/04/2020   PT End of Session - 09/04/20 5732    Visit Number 3    Number of Visits 8    Date for PT Re-Evaluation 09/28/20    Authorization Type [place] [employer]    Authorization Time Period $20 copay    PT Start Time 1140    PT Stop Time 1225    PT Time Calculation (min) 45 min    Activity Tolerance Patient tolerated treatment well    Behavior During Therapy [facility] for tasks assessed/performed           Past Medical History:  Diagnosis Date  . Anxiety   . Anxiety with depression    Controlled.  . Common migraine without aura   . Diabetes mellitus type I, controlled ([place])    Treated with 4 times daily insulin based on blood sugar monitoring  . Femoral hernia 10/2019  . Hyperlipidemia due to type 1 diabetes mellitus (HCC)    Currently on atorvastatin.  Marland Kitchen Hypothyroid   . MVP (mitral valve prolapse) 2005   Renally diagnosed back in 2005 (during her pregnancy)_, most recently evaluated in 2012.  ([facility], Dr. Delman Cheadle); 11/2010 TTE: Moderate MR, posteriorly directed (recommend TEE) => TEE (01/2011) - mild MVP (anterior leaflet) with Mild-Mod MR.;     Past Surgical History:  Procedure Laterality Date  . LAPAROSCOPIC LYSIS OF ADHESIONS    . LUMBAR MICRODISCECTOMY    . TRANSESOPHAGEAL ECHOCARDIOGRAM  02/13/2011   [place] -> Dr. Delman Cheadle [facility]): EF 55 to 60%.  Borderline LA dilation.  Prolapse of A2 scallop of mitral valve-mild MVP with mild to moderate MR.  (2 eccentric jets, posteriorly directed, c/w anterior leaflet pathology))  . TRANSTHORACIC ECHOCARDIOGRAM  12/11/2010   [facility] -> Dr. Delman Cheadle [facility]): EF 35 to 6%.  Mild LA dilation.  Moderate MR-eccentric/possibly directed jet consistent with antilipid pathology.  Mild TR, LP HTN, mild PR.  Recommend TEE.  Marland Kitchen TREADMILL STRESS ECHOCARDIOGRAM     [facility] -> Dr. Delman Cheadle [facility]): Exercised 10 minutes (11 minutes), achieved Max Heart Rate 162 = 92% Max Predicted Heart Rate, clinically and electrocardiographically negative.  Nonspecific ST and T wave changes.  Prestress echo showed EF 55 to 60% with normal wall motion.  Post-Stress: EF 65 to 70% with nl augmentation of all walls.=> NORMAL STRESS [facility]    There were no vitals filed for this visit.   Subjective Assessment - 09/04/20 1139    Subjective worked 2, 12-hour shifts over the weekend and had some burning pain in Lt shoulder.  Resolved with tylenol and diclofenac    Pertinent History IDDM1 adult onset, mitral valve prolapse, back surgery 2012 L4-5 discectomy    Limitations Lifting    Patient Stated Goals to use left shoulder for activity, full movement, return to sailing & stand up paddle board.    Currently in Pain? No/denies    Pain Onset --                             [facility] Adult PT Treatment/Exercise - 09/04/20 1147      Shoulder Exercises:  Sidelying   Flexion Both;20 reps;Weights    Flexion Weight (lbs) 2    ABduction Both;20 reps;Weights    ABduction Weight (lbs) 2      Shoulder Exercises: Standing   External Rotation Left    Theraband Level (Shoulder External Rotation) Level 2 (Red)    External Rotation Weight (lbs) 2 sets of 10    Internal Rotation Left    Theraband Level (Shoulder Internal Rotation) Level 2 (Red)    Internal Rotation Weight (lbs) 2 sets of 10    Extension Both    Theraband Level (Shoulder Extension) Level 3 (Green)    Extension Limitations 2 sets of 10    Row Both    Theraband Level (Shoulder Row) Level 3 (Green)    Row  Limitations 2 sets of 10      Shoulder Exercises: ROM/Strengthening   UBE (Upper Arm Bike) 3 min fwd, 3 min retro; L4    Pushups 15 reps    Pushups Limitations push up plus      Shoulder Exercises: Stretch   Internal Rotation Stretch Limitations 3x30 sec; with towel - hand behind back                  PT Education - 09/04/20 1241    Education Details added internal rotation stretch to HEP    Person(s) Educated Patient    Methods Explanation;Demonstration;Handout    Comprehension Verbalized understanding;Returned demonstration               PT Long Term Goals - 09/04/20 1241      PT LONG TERM GOAL #1   Title FOTO 64% functional    Time 4    Period Weeks    Status On-going    Target Date 09/28/20      PT LONG TERM GOAL #2   Title left shoulder AROM within 10* of Right shoulder    Time 4    Period Weeks    Status On-going      PT LONG TERM GOAL #3   Title Patient reports left shoulder pain </= 2/10 with activities    Time 4    Period Weeks    Status On-going      PT LONG TERM GOAL #4   Title Patient demonstrates lifting & functional activities with proper posture & stability.    Time 4    Period Weeks    Status On-going                 Plan - 09/04/20 1242    Clinical Impression Statement Pt overall doing well, with small set back yesterday after working extended hours.  Added internal rotation stretch today as that is limiting for her, and will continue to benefit from PT to maximize function.    Examination-Activity Limitations Carry;Lift;Reach Overhead    Examination-Participation Restrictions Community Activity;Occupation    Stability/Clinical Decision Making Stable/Uncomplicated    Rehab Potential Good    PT Frequency 2x / week    PT Duration 4 weeks    PT Treatment/Interventions ADLs/Self Care Home Management;Cryotherapy;Electrical Stimulation;Iontophoresis 4mg /ml Dexamethasone;Ultrasound;DME Instruction;Therapeutic  activities;Therapeutic exercise;Neuromuscular re-education;Patient/family education;Manual techniques;Passive range of motion;Dry needling;Taping;Vasopneumatic Device;Joint Manipulations    PT Next Visit Plan update HEP, manual therapy & therapeutic exercise to left shoulder, modalities for pain    PT Home Exercise Plan Access Code: [email or web address]    Consulted and Agree with Plan of Care Patient           Patient will benefit from skilled  therapeutic intervention in order to improve the following deficits and impairments:  Decreased range of motion,Decreased strength,Increased edema,Impaired UE functional use,Postural dysfunction,Pain  Visit Diagnosis: Acute pain of left shoulder  Stiffness of left shoulder, not elsewhere classified  Localized edema  Abnormal posture  Muscle weakness (generalized)     Problem List Patient Active Problem List   Diagnosis Date Noted  . Type 1 diabetes mellitus with hyperglycemia ([place]) 08/22/2020  . History of mitral valve prolapse in adulthood 08/23/2010      Laureen Abrahams, PT, DPT 09/04/20 12:44 PM     [facility] Physical Therapy [address] [place], [place], [address] Phone: [phone]   Fax:  [phone]  Name: SAHVANNAH RIESER MRN: [id number] Date of Birth: 1966-04-22

## 2020-09-04 NOTE — Patient Instructions (Signed)
Access Code: [email or web address] URL: [email or web address] Date: 09/04/2020 Prepared by: Faustino Congress  Exercises Supine Chin Tuck - 1-3 x daily - 7 x weekly - 2 sets - 10 reps - 5 seconds hold Standing Row with Anchored Resistance - 2 x daily - 6 x weekly - 2-3 sets - 10 reps Shoulder extension with resistance - Neutral - 2 x daily - 6 x weekly - 10 reps - 2-3 sets Shoulder External Rotation with Anchored Resistance - 2 x daily - 6 x weekly - 10 reps - 2-3 sets Shoulder Internal Rotation with Resistance - 2 x daily - 6 x weekly - 10 reps - 2-3 sets Standing Shoulder Internal Rotation Stretch with Towel - 2 x daily - 7 x weekly - 1 sets - 3-5 reps - 15-20 sec hold

## 2020-09-06 ENCOUNTER — Ambulatory Visit (HOSPITAL_COMMUNITY): Payer: 59 | Attending: Cardiology

## 2020-09-06 ENCOUNTER — Ambulatory Visit: Payer: 59 | Admitting: Physical Therapy

## 2020-09-06 ENCOUNTER — Other Ambulatory Visit: Payer: Self-pay

## 2020-09-06 ENCOUNTER — Encounter: Payer: Self-pay | Admitting: Physical Therapy

## 2020-09-06 DIAGNOSIS — E1065 Type 1 diabetes mellitus with hyperglycemia: Secondary | ICD-10-CM | POA: Diagnosis not present

## 2020-09-06 DIAGNOSIS — M6281 Muscle weakness (generalized): Secondary | ICD-10-CM

## 2020-09-06 DIAGNOSIS — R293 Abnormal posture: Secondary | ICD-10-CM

## 2020-09-06 DIAGNOSIS — I059 Rheumatic mitral valve disease, unspecified: Secondary | ICD-10-CM | POA: Diagnosis not present

## 2020-09-06 DIAGNOSIS — M25612 Stiffness of left shoulder, not elsewhere classified: Secondary | ICD-10-CM | POA: Diagnosis not present

## 2020-09-06 DIAGNOSIS — M25512 Pain in left shoulder: Secondary | ICD-10-CM

## 2020-09-06 DIAGNOSIS — R600 Localized edema: Secondary | ICD-10-CM | POA: Diagnosis not present

## 2020-09-06 DIAGNOSIS — Z8679 Personal history of other diseases of the circulatory system: Secondary | ICD-10-CM | POA: Insufficient documentation

## 2020-09-06 LAB — ECHOCARDIOGRAM COMPLETE
Area-P 1/2: 3.27 cm2
S' Lateral: 3.2 cm

## 2020-09-06 NOTE — Therapy (Signed)
[facility] Physical Therapy [address] [place], [place], [address] Phone: [phone]   Fax:  [phone]  Physical Therapy Treatment  Patient Details  Name: Maria Evans MRN: [id number] Date of Birth: July 08, 1965 Referring Provider (PT): Lynne Leader, MD   Encounter Date: 09/06/2020   PT End of Session - 09/06/20 1007    Visit Number 4    Number of Visits 8    Date for PT Re-Evaluation 09/28/20    Authorization Type [place] UMR    Authorization Time Period $20 copay    PT Start Time 0930    PT Stop Time 1010    PT Time Calculation (min) 40 min    Activity Tolerance Patient tolerated treatment well    Behavior During Therapy [facility] for tasks assessed/performed           Past Medical History:  Diagnosis Date  . Anxiety   . Anxiety with depression    Controlled.  . Common migraine without aura   . Diabetes mellitus type I, controlled ([place])    Treated with 4 times daily insulin based on blood sugar monitoring  . Femoral hernia 10/2019  . Hyperlipidemia due to type 1 diabetes mellitus (HCC)    Currently on atorvastatin.  Marland Kitchen Hypothyroid   . MVP (mitral valve prolapse) 2005   Renally diagnosed back in 2005 (during her pregnancy)_, most recently evaluated in 2012.  ([facility], Dr. Delman Cheadle); 11/2010 TTE: Moderate MR, posteriorly directed (recommend TEE) => TEE (01/2011) - mild MVP (anterior leaflet) with Mild-Mod MR.;     Past Surgical History:  Procedure Laterality Date  . LAPAROSCOPIC LYSIS OF ADHESIONS    . LUMBAR MICRODISCECTOMY    . TRANSESOPHAGEAL ECHOCARDIOGRAM  02/13/2011   [facility] -> Dr. Delman Cheadle [facility]): EF 55 to 60%.  Borderline LA dilation.  Prolapse of A2 scallop of mitral valve-mild MVP with mild to moderate MR.  (2 eccentric jets, posteriorly directed, c/w anterior leaflet pathology))  . TRANSTHORACIC ECHOCARDIOGRAM  12/11/2010   [facility] -> Dr. Delman Cheadle [facility]): EF 35 to 6%.  Mild LA dilation.  Moderate MR-eccentric/possibly directed jet consistent with antilipid pathology.  Mild TR, LP HTN, mild PR.  Recommend TEE.  Marland Kitchen TREADMILL STRESS ECHOCARDIOGRAM     [facility] -> Dr. Delman Cheadle [facility]): Exercised 10 minutes (11 minutes), achieved Max Heart Rate 162 = 92% Max Predicted Heart Rate, clinically and electrocardiographically negative.  Nonspecific ST and T wave changes.  Prestress echo showed EF 55 to 60% with normal wall motion.  Post-Stress: EF 65 to 70% with nl augmentation of all walls.=> NORMAL STRESS [facility]    There were no vitals filed for this visit.   Subjective Assessment - 09/06/20 0928    Subjective shoulder is much better - hasn't had pain since Sunday    Pertinent History IDDM1 adult onset, mitral valve prolapse, back surgery 2012 L4-5 discectomy    Limitations Lifting    Patient Stated Goals to use left shoulder for activity, full movement, return to sailing & stand up paddle board.    Currently in Pain? No/denies                             [facility] Adult PT Treatment/Exercise - 09/06/20 0930      Shoulder Exercises: Standing   Extension Both    Theraband Level (Shoulder Extension) Level 4 (Blue)  Extension Limitations 2 sets of 10    Row Both    Theraband Level (Shoulder Row) Level 4 (Blue)    Row Limitations 2 sets of 10    Other Standing Exercises LT IR/ER walkout x 20 reps; L3 band; hammer curl to overhead press 2x10 2# bil    Other Standing Exercises flexion to horizontal abdct to eccentric abduction and return 2x10; 2# bil      Shoulder Exercises: ROM/Strengthening   UBE (Upper Arm Bike) 3 min fwd, 3 min retro; L4    Lat Pull Limitations 15# 2x10    Cybex Row Limitations 15# 2x10    Wall Pushups 20 reps   from counter heights                      PT Long Term Goals - 09/04/20 1241       PT LONG TERM GOAL #1   Title FOTO 64% functional    Time 4    Period Weeks    Status On-going    Target Date 09/28/20      PT LONG TERM GOAL #2   Title left shoulder AROM within 10* of Right shoulder    Time 4    Period Weeks    Status On-going      PT LONG TERM GOAL #3   Title Patient reports left shoulder pain </= 2/10 with activities    Time 4    Period Weeks    Status On-going      PT LONG TERM GOAL #4   Title Patient demonstrates lifting & functional activities with proper posture & stability.    Time 4    Period Weeks    Status On-going                 Plan - 09/06/20 1007    Clinical Impression Statement Pt continues to do well rating pain levels to be consistently low.  Overall on track to meet LTGs and possibly early if she continues to do well.  She's going to [place] for a week so will see how she does when she returns.    Examination-Activity Limitations Carry;Lift;Reach Overhead    Examination-Participation Restrictions Community Activity;Occupation    Stability/Clinical Decision Making Stable/Uncomplicated    Rehab Potential Good    PT Frequency 2x / week    PT Duration 4 weeks    PT Treatment/Interventions ADLs/Self Care Home Management;Cryotherapy;Electrical Stimulation;Iontophoresis 4mg /ml Dexamethasone;Ultrasound;DME Instruction;Therapeutic activities;Therapeutic exercise;Neuromuscular re-education;Patient/family education;Manual techniques;Passive range of motion;Dry needling;Taping;Vasopneumatic Device;Joint Manipulations    PT Next Visit Plan continue to work on strengthening, see how shoulder is doing after traveling    PT Home Exercise Plan Access Code: [email or web address]    Consulted and Agree with Plan of Care Patient           Patient will benefit from skilled therapeutic intervention in order to improve the following deficits and impairments:  Decreased range of motion,Decreased strength,Increased edema,Impaired UE functional use,Postural  dysfunction,Pain  Visit Diagnosis: Acute pain of left shoulder  Stiffness of left shoulder, not elsewhere classified  Localized edema  Abnormal posture  Muscle weakness (generalized)     Problem List Patient Active Problem List   Diagnosis Date Noted  . Type 1 diabetes mellitus with hyperglycemia ([place]) 08/22/2020  . History of mitral valve prolapse in adulthood 08/23/2010      Laureen Abrahams, PT, DPT 09/06/20 10:11 AM    [facility] Physical Therapy [address] [place], [place], [address]  Phone: [phone]   Fax:  [phone]  Name: Maria Evans MRN: [id number] Date of Birth: Feb 16, 1966

## 2020-09-15 NOTE — Progress Notes (Signed)
   I, Wendy Poet, LAT, ATC, am serving as scribe for Dr. Lynne Leader.  ELOWYN Evans is a 55 y.o. female who presents to [place] [facility] today for f/u of L shoulder pain.  She was last seen by Dr. Georgina Snell on 08/07/20 and was referred to PT of which she's completed 4 visits.  Since her last visit w/ Dr. Georgina Snell, pt reports PT is really helping, noting 60-70% improvement. Pt notes that she is still not sleeping well, but has less pain when dressing-un-dressing herself.   Diagnostic imaging: L shoulder XR- 08/07/20  Pertinent review of systems: No fevers or chills  Relevant historical information: Type 1 diabetes   Exam:  BP 111/76 (BP Location: Right Arm, Patient Position: Sitting, Cuff Size: Normal)   Pulse 76   Ht 5\' 8"  (1.727 m)   Wt 151 lb 3.2 oz (68.6 kg)   LMP 02/07/2016   SpO2 98%   BMI 22.99 kg/m  General: Well Developed, well nourished, and in no acute distress.   MSK: Left shoulder normal-appearing normal motion normal strength minimally positive impingement testing.    Lab and Radiology Results  EXAM: LEFT SHOULDER - 2+ VIEW  COMPARISON:  None.  FINDINGS: Oblique, Y scapular, and axillary images were obtained. No fracture or dislocation. There is slight narrowing of the acromioclavicular joint. The glenohumeral joint appears normal. No erosive change or intra-articular calcification. Visualized left lung clear.  IMPRESSION: Mild narrowing acromioclavicular joint. Glenohumeral joint appears normal. No fracture or dislocation.   Electronically Signed   By: Lowella Grip III M.D.   On: 08/07/2020 16:05  I, Lynne Leader, personally (independently) visualized and performed the interpretation of the images attached in this note.     Assessment and Plan: 55 y.o. female with left shoulder pain thought to be due to shoulder impingement and bursitis.  Patient has had significant improvement with physical therapy.  She still has  improvement left again with a bit more time and a bit more PT and home exercise program.  Plan for continued ongoing conservative management.  Recheck back with me as needed.     Discussed warning signs or symptoms. Please see discharge instructions. Patient expresses understanding.   The above documentation has been reviewed and is accurate and complete Lynne Leader, M.D.  Total encounter time 20 minutes including face-to-face time with the patient and, reviewing past medical record, and charting on the date of service.   Treatment plan and backup plan.

## 2020-09-18 ENCOUNTER — Ambulatory Visit: Payer: 59 | Admitting: Physical Therapy

## 2020-09-18 ENCOUNTER — Other Ambulatory Visit: Payer: Self-pay

## 2020-09-18 ENCOUNTER — Ambulatory Visit: Payer: 59 | Admitting: Family Medicine

## 2020-09-18 ENCOUNTER — Encounter: Payer: Self-pay | Admitting: Physical Therapy

## 2020-09-18 VITALS — BP 111/76 | HR 76 | Ht 68.0 in | Wt 151.2 lb

## 2020-09-18 DIAGNOSIS — M25612 Stiffness of left shoulder, not elsewhere classified: Secondary | ICD-10-CM

## 2020-09-18 DIAGNOSIS — R293 Abnormal posture: Secondary | ICD-10-CM

## 2020-09-18 DIAGNOSIS — G8929 Other chronic pain: Secondary | ICD-10-CM

## 2020-09-18 DIAGNOSIS — M25512 Pain in left shoulder: Secondary | ICD-10-CM | POA: Diagnosis not present

## 2020-09-18 DIAGNOSIS — R600 Localized edema: Secondary | ICD-10-CM

## 2020-09-18 DIAGNOSIS — M6281 Muscle weakness (generalized): Secondary | ICD-10-CM | POA: Diagnosis not present

## 2020-09-18 NOTE — Patient Instructions (Signed)
Access Code: [email or web address] URL: [email or web address] Date: 09/18/2020 Prepared by: Faustino Congress  Exercises Standing Row with Anchored Resistance - 2 x daily - 6 x weekly - 2-3 sets - 10 reps Shoulder extension with resistance - Neutral - 2 x daily - 6 x weekly - 10 reps - 2-3 sets Shoulder External Rotation with Anchored Resistance - 2 x daily - 6 x weekly - 10 reps - 2-3 sets Shoulder Internal Rotation with Resistance - 2 x daily - 6 x weekly - 10 reps - 2-3 sets Standing Shoulder Internal Rotation Stretch with Towel - 2 x daily - 7 x weekly - 1 sets - 3-5 reps - 15-20 sec hold Standing Shoulder Flexion to 90 Degrees with Dumbbells - 1 x daily - 7 x weekly - 3 sets - 10 reps Shoulder Abduction with Dumbbells - Thumbs Up - 1 x daily - 7 x weekly - 3 sets - 10 reps Standing Overhead Press with Dumbbells at [employer] - 1 x daily - 7 x weekly - 3 sets - 10 reps Standing Bent Over Single Arm Scapular Row with Table Support - 1 x daily - 7 x weekly - 3 sets - 10 reps Inverted Row with TRX - 1 x daily - 7 x weekly - 3 sets - 10 reps Y Fly with TRX - 1 x daily - 7 x weekly - 3 sets - 10 reps T Deltoid Fly with TRX - 1 x daily - 7 x weekly - 3 sets - 10 reps Lat Pull Down - 1 x daily - 7 x weekly - 3 sets - 10 reps Seated Row Cable Machine - 1 x daily - 7 x weekly - 3 sets - 10 reps

## 2020-09-18 NOTE — Therapy (Signed)
[facility] [address] [place], [place], [address] Phone: [phone]   Fax:  [phone]  Physical Therapy Treatment  Patient Details  Name: Maria Evans MRN: [id number] Date of Birth: [birth year] Referring Provider (PT): Lynne Leader, MD   Encounter Date: 09/18/2020   PT End of Session - 09/18/20 1240    Visit Number 5    Number of Visits 8    Date for PT Re-Evaluation 09/28/20    Authorization Type [place] [employer]    Authorization Time Period $20 copay    PT Start Time 1145    PT Stop Time 1225    PT Time Calculation (min) 40 min    Activity Tolerance Patient tolerated treatment well    Behavior During Therapy [facility] for tasks assessed/performed           Past Medical History:  Diagnosis Date  . Anxiety   . Anxiety with depression    Controlled.  . Common migraine without aura   . Diabetes mellitus type I, controlled ([place])    Treated with 4 times daily insulin based on blood sugar monitoring  . Femoral hernia 10/2019  . Hyperlipidemia due to type 1 diabetes mellitus (HCC)    Currently on atorvastatin.  Marland Kitchen Hypothyroid   . MVP (mitral valve prolapse) 2005   Renally diagnosed back in 2005 (during her pregnancy)_, most recently evaluated in 2012.  ([facility], Dr. Delman Cheadle); 11/2010 TTE: Moderate MR, posteriorly directed (recommend TEE) => TEE (01/2011) - mild MVP (anterior leaflet) with Mild-Mod MR.;     Past Surgical History:  Procedure Laterality Date  . LAPAROSCOPIC LYSIS OF ADHESIONS    . LUMBAR MICRODISCECTOMY    . TRANSESOPHAGEAL ECHOCARDIOGRAM  02/13/2011   [facility] -> Dr. Delman Cheadle [facility]): EF 55 to 60%.  Borderline LA dilation.  Prolapse of A2 scallop of mitral valve-mild MVP with mild to moderate MR.  (2 eccentric jets, posteriorly directed, c/w anterior leaflet pathology))  . TRANSTHORACIC ECHOCARDIOGRAM  12/11/2010   [facility] -> Dr. Delman Cheadle [facility]): EF 35 to 6%.  Mild LA dilation.  Moderate MR-eccentric/possibly directed jet consistent with antilipid pathology.  Mild TR, LP HTN, mild PR.  Recommend TEE.  Marland Kitchen TREADMILL STRESS ECHOCARDIOGRAM     [facility] -> Dr. Delman Cheadle [facility]): Exercised 10 minutes (11 minutes), achieved Max Heart Rate 162 = 92% Max Predicted Heart Rate, clinically and electrocardiographically negative.  Nonspecific ST and T wave changes.  Prestress echo showed EF 55 to 60% with normal wall motion.  Post-Stress: EF 65 to 70% with nl augmentation of all walls.=> NORMAL STRESS [facility]    There were no vitals filed for this visit.   Subjective Assessment - 09/18/20 1146    Subjective overall doing much better, slacked off a little on vacation and noticed a little bit of pain but otherwise no issues    Pertinent History IDDM1 adult onset, mitral valve prolapse, back surgery 2012 L4-5 discectomy    Limitations Lifting    Patient Stated Goals to use left shoulder for activity, full movement, return to sailing & stand up paddle board.    Currently in Pain? No/denies              [facility] PT Assessment - 09/18/20 1219      Assessment   Medical Diagnosis Left shoulder pain    Referring Provider (PT) Lynne Leader, MD    Onset Date/Surgical Date 08/07/20  AROM   Left Shoulder Flexion 150 Degrees    Left Shoulder ABduction 180 Degrees                         [facility] Adult PT Treatment/Exercise - 09/18/20 1144      Shoulder Exercises: Standing   Flexion Both   2x10; isometic hold while other side works   Shoulder Flexion Weight (lbs) 2    ABduction Both   2x10; isometic hold while other side works   Shoulder ABduction Weight (lbs) 2    Other Standing Exercises bent over row 2x10; 4# bil    Other Standing Exercises overhead press with isometric hold on contralateral side 2x10, 2#      Shoulder Exercises:  ROM/Strengthening   UBE (Upper Arm Bike) 3 min fwd, 3 min retro; L4    Lat Pull Limitations 15# 2x10    Cybex Row Limitations 15# 2x10    Other ROM/Strengthening Exercises TRX rows, T's Y's 2x10 bil                       PT Long Term Goals - 09/18/20 1240      PT LONG TERM GOAL #1   Title FOTO 64% functional    Time 4    Period Weeks    Status On-going    Target Date 09/28/20      PT LONG TERM GOAL #2   Title left shoulder AROM within 10* of Right shoulder    Time 4    Period Weeks    Status Achieved      PT LONG TERM GOAL #3   Title Patient reports left shoulder pain </= 2/10 with activities    Time 4    Period Weeks    Status Achieved      PT LONG TERM GOAL #4   Title Patient demonstrates lifting & functional activities with proper posture & stability.    Time 4    Period Weeks    Status On-going    Target Date 09/28/20                 Plan - 09/18/20 1241    Clinical Impression Statement Pt overall doing well with PT with improved ROM and decreased pain, meeting 2/4 LTGs.  Provided extensive HEP with variety for gym/home exercises today and pt to work on those over the next week.  Anticipate she will be ready for d/c next 1-2 visits.    Examination-Activity Limitations Carry;Lift;Reach Overhead    Examination-Participation Restrictions Community Activity;Occupation    Stability/Clinical Decision Making Stable/Uncomplicated    Rehab Potential Good    PT Frequency 2x / week    PT Duration 4 weeks    PT Treatment/Interventions ADLs/Self Care Home Management;Cryotherapy;Electrical Stimulation;Iontophoresis 4mg /ml Dexamethasone;Ultrasound;DME Instruction;Therapeutic activities;Therapeutic exercise;Neuromuscular re-education;Patient/family education;Manual techniques;Passive range of motion;Dry needling;Taping;Vasopneumatic Device;Joint Manipulations    PT Next Visit Plan see how new HEP is going, likely d/c    PT Home Exercise Plan Access Code:  [email or web address]    Consulted and Agree with Plan of Care Patient           Patient will benefit from skilled therapeutic intervention in order to improve the following deficits and impairments:  Decreased range of motion,Decreased strength,Increased edema,Impaired UE functional use,Postural dysfunction,Pain  Visit Diagnosis: Acute pain of left shoulder  Stiffness of left shoulder, not elsewhere classified  Localized edema  Abnormal posture  Muscle weakness (generalized)  Problem List Patient Active Problem List   Diagnosis Date Noted  . Type 1 diabetes mellitus with hyperglycemia ([place]) 08/22/2020  . History of mitral valve prolapse in adulthood 08/23/2010      Laureen Abrahams, PT, DPT 09/18/20 12:43 PM    [facility] Therapy [address] [place], [place], [address] Phone: [phone]   Fax:  [phone]  Name: Maria Evans MRN: [id number] Date of Birth: [birth year]

## 2020-09-18 NOTE — Patient Instructions (Addendum)
Thank you for coming in today.  Continue PT and home exercises.   Recheck with me as needed for this other issues.

## 2020-09-20 ENCOUNTER — Encounter: Payer: 59 | Admitting: Physical Therapy

## 2020-09-26 ENCOUNTER — Ambulatory Visit: Payer: 59 | Admitting: Physical Therapy

## 2020-09-26 ENCOUNTER — Other Ambulatory Visit: Payer: Self-pay

## 2020-09-26 ENCOUNTER — Encounter: Payer: Self-pay | Admitting: Physical Therapy

## 2020-09-26 DIAGNOSIS — R600 Localized edema: Secondary | ICD-10-CM | POA: Diagnosis not present

## 2020-09-26 DIAGNOSIS — M25512 Pain in left shoulder: Secondary | ICD-10-CM

## 2020-09-26 DIAGNOSIS — R293 Abnormal posture: Secondary | ICD-10-CM | POA: Diagnosis not present

## 2020-09-26 DIAGNOSIS — M6281 Muscle weakness (generalized): Secondary | ICD-10-CM

## 2020-09-26 DIAGNOSIS — M25612 Stiffness of left shoulder, not elsewhere classified: Secondary | ICD-10-CM | POA: Diagnosis not present

## 2020-09-26 NOTE — Therapy (Signed)
[facility] Therapy [address] [place], [place], [address] Phone: [phone]   Fax:  [phone]  Physical Therapy Treatment/Discharge Summary  Patient Details  Name: Maria Evans MRN: [id number] Date of Birth: [birth year] Referring Provider (PT): Lynne Leader, MD   Encounter Date: 09/26/2020   PT End of Session - 09/26/20 1219    Visit Number 6    Number of Visits 8    Date for PT Re-Evaluation 09/28/20    Authorization Type [place] [employer]    Authorization Time Period $20 copay    PT Start Time 1155    PT Stop Time 1210    PT Time Calculation (min) 15 min    Activity Tolerance Patient tolerated treatment well    Behavior During Therapy [facility] for tasks assessed/performed           Past Medical History:  Diagnosis Date  . Anxiety   . Anxiety with depression    Controlled.  . Common migraine without aura   . Diabetes mellitus type I, controlled ([place])    Treated with 4 times daily insulin based on blood sugar monitoring  . Femoral hernia 10/2019  . Hyperlipidemia due to type 1 diabetes mellitus (HCC)    Currently on atorvastatin.  Marland Kitchen Hypothyroid   . MVP (mitral valve prolapse) 2005   Renally diagnosed back in 2005 (during her pregnancy)_, most recently evaluated in 2012.  ([facility], Dr. Delman Cheadle); 11/2010 TTE: Moderate MR, posteriorly directed (recommend TEE) => TEE (01/2011) - mild MVP (anterior leaflet) with Mild-Mod MR.;     Past Surgical History:  Procedure Laterality Date  . LAPAROSCOPIC LYSIS OF ADHESIONS    . LUMBAR MICRODISCECTOMY    . TRANSESOPHAGEAL ECHOCARDIOGRAM  02/13/2011   [facility] -> Dr. Delman Cheadle [facility]): EF 55 to 60%.  Borderline LA dilation.  Prolapse of A2 scallop of mitral valve-mild MVP with mild to moderate MR.  (2 eccentric jets, posteriorly directed, c/w anterior leaflet pathology))  . TRANSTHORACIC ECHOCARDIOGRAM  12/11/2010    [facility] -> Dr. Delman Cheadle [facility]): EF 35 to 6%.  Mild LA dilation.  Moderate MR-eccentric/possibly directed jet consistent with antilipid pathology.  Mild TR, LP HTN, mild PR.  Recommend TEE.  Marland Kitchen TREADMILL STRESS ECHOCARDIOGRAM     [facility] -> Dr. Delman Cheadle [facility]): Exercised 10 minutes (11 minutes), achieved Max Heart Rate 162 = 92% Max Predicted Heart Rate, clinically and electrocardiographically negative.  Nonspecific ST and T wave changes.  Prestress echo showed EF 55 to 60% with normal wall motion.  Post-Stress: EF 65 to 70% with nl augmentation of all walls.=> NORMAL STRESS [facility]    There were no vitals filed for this visit.   Subjective Assessment - 09/26/20 1154    Subjective no pain, shoulder feels fine.  feels ready to d/c today.    Pertinent History IDDM1 adult onset, mitral valve prolapse, back surgery 2012 L4-5 discectomy    Limitations Lifting    Patient Stated Goals to use left shoulder for activity, full movement, return to sailing & stand up paddle board.    Currently in Pain? No/denies              [facility] PT Assessment - 09/26/20 1203      Assessment   Medical Diagnosis Left shoulder pain    Referring Provider (PT) Lynne Leader, MD      Observation/Other Assessments   Focus on Therapeutic Outcomes (FOTO)  69  AROM   Left Shoulder Flexion 160 Degrees    Left Shoulder ABduction 180 Degrees      Strength   Overall Strength Comments able to lift 18# box overhead without difficulty    Left Shoulder Flexion 5/5    Left Shoulder ABduction 5/5    Left Shoulder Internal Rotation 5/5    Left Shoulder External Rotation 5/5                         [facility] Adult PT Treatment/Exercise - 09/26/20 1156      Shoulder Exercises: ROM/Strengthening   UBE (Upper Arm Bike) 3 min fwd, 3 min retro; L3.5                       PT Long Term Goals - 09/26/20 1219       PT LONG TERM GOAL #1   Title FOTO 64% functional    Time 4    Period Weeks    Status Achieved      PT LONG TERM GOAL #2   Title left shoulder AROM within 10* of Right shoulder    Time 4    Period Weeks    Status Achieved      PT LONG TERM GOAL #3   Title Patient reports left shoulder pain </= 2/10 with activities    Time 4    Period Weeks    Status Achieved      PT LONG TERM GOAL #4   Title Patient demonstrates lifting & functional activities with proper posture & stability.    Time 4    Period Weeks    Status Achieved                 Plan - 09/26/20 1220    Clinical Impression Statement Pt has met all goals and is ready to d/c from PT.  She has a great understanding of HEP and is active at [facility] for continued exercise.  Will d/c PT today.    Examination-Activity Limitations Carry;Lift;Reach Overhead    Examination-Participation Restrictions Community Activity;Occupation    Stability/Clinical Decision Making Stable/Uncomplicated    Rehab Potential Good    PT Frequency 2x / week    PT Duration 4 weeks    PT Treatment/Interventions ADLs/Self Care Home Management;Cryotherapy;Electrical Stimulation;Iontophoresis 8m/ml Dexamethasone;Ultrasound;DME Instruction;Therapeutic activities;Therapeutic exercise;Neuromuscular re-education;Patient/family education;Manual techniques;Passive range of motion;Dry needling;Taping;Vasopneumatic Device;Joint Manipulations    PT Next Visit Plan d/c today    PT Home Exercise Plan Access Code: [email or web address]    Consulted and Agree with Plan of Care Patient           Patient will benefit from skilled therapeutic intervention in order to improve the following deficits and impairments:  Decreased range of motion,Decreased strength,Increased edema,Impaired UE functional use,Postural dysfunction,Pain  Visit Diagnosis: Acute pain of left shoulder  Stiffness of left shoulder, not elsewhere classified  Localized edema  Abnormal  posture  Muscle weakness (generalized)     Problem List Patient Active Problem List   Diagnosis Date Noted  . Type 1 diabetes mellitus with hyperglycemia ([place] 08/22/2020  . History of mitral valve prolapse in adulthood 08/23/2010     SLaureen Abrahams PT, DPT 09/26/20 12:22 PM    [facility] [address] [place] [address]Phone: [phone]  Fax:  [phone] Name: Maria DEKONINGMRN: [id number] of Birth: [birth year]    PHYSICAL THERAPY DISCHARGE SUMMARY  [facility]  Current functional level related to goals / functional outcomes: See above   Remaining deficits: n/a   Education / Equipment: HEP  Plan: Patient agrees to discharge.  Patient goals were met. Patient is being discharged due to meeting the stated rehab goals.  ?????    Laureen Abrahams, PT, DPT 09/26/20 12:22 PM  [facility] [address] [place], [place], [address] Phone: [phone]   Fax:  [phone]

## 2020-09-28 ENCOUNTER — Encounter: Payer: 59 | Admitting: Physical Therapy

## 2020-10-02 ENCOUNTER — Other Ambulatory Visit (HOSPITAL_COMMUNITY): Payer: Self-pay

## 2020-10-02 ENCOUNTER — Other Ambulatory Visit: Payer: Self-pay | Admitting: Internal Medicine

## 2020-10-02 MED ORDER — ESCITALOPRAM OXALATE 20 MG PO TABS
20.0000 mg | ORAL_TABLET | Freq: Once | ORAL | 0 refills | Status: DC
  Filled 2020-10-02: qty 90, 90d supply, fill #0

## 2020-10-05 ENCOUNTER — Encounter: Payer: Self-pay | Admitting: Internal Medicine

## 2020-10-09 ENCOUNTER — Other Ambulatory Visit: Payer: Self-pay

## 2020-10-09 ENCOUNTER — Other Ambulatory Visit (HOSPITAL_COMMUNITY): Payer: Self-pay

## 2020-10-09 ENCOUNTER — Other Ambulatory Visit: Payer: Self-pay | Admitting: Internal Medicine

## 2020-10-09 ENCOUNTER — Encounter: Payer: Self-pay | Admitting: Internal Medicine

## 2020-10-09 ENCOUNTER — Ambulatory Visit: Payer: 59 | Admitting: Internal Medicine

## 2020-10-09 VITALS — BP 120/80 | HR 76 | Ht 68.0 in | Wt 150.6 lb

## 2020-10-09 DIAGNOSIS — E1065 Type 1 diabetes mellitus with hyperglycemia: Secondary | ICD-10-CM | POA: Diagnosis not present

## 2020-10-09 DIAGNOSIS — E039 Hypothyroidism, unspecified: Secondary | ICD-10-CM | POA: Diagnosis not present

## 2020-10-09 LAB — T4, FREE: Free T4: 0.94 ng/dL (ref 0.60–1.60)

## 2020-10-09 LAB — POCT GLYCOSYLATED HEMOGLOBIN (HGB A1C): Hemoglobin A1C: 6.2 % — AB (ref 4.0–5.6)

## 2020-10-09 LAB — TSH: TSH: 0.73 u[IU]/mL (ref 0.35–4.50)

## 2020-10-09 MED ORDER — TRESIBA FLEXTOUCH 100 UNIT/ML ~~LOC~~ SOPN
PEN_INJECTOR | SUBCUTANEOUS | 3 refills | Status: DC
  Filled 2020-10-09: qty 9, 69d supply, fill #0
  Filled 2021-01-06: qty 9, 69d supply, fill #1
  Filled 2021-03-28: qty 9, 69d supply, fill #2
  Filled 2021-06-14: qty 6, 16d supply, fill #3
  Filled 2021-06-14 – 2021-06-21 (×2): qty 9, 69d supply, fill #3

## 2020-10-09 MED ORDER — LYUMJEV KWIKPEN 100 UNIT/ML ~~LOC~~ SOPN
PEN_INJECTOR | SUBCUTANEOUS | 11 refills | Status: DC
  Filled 2020-10-09: qty 15, 46d supply, fill #0
  Filled 2021-01-06 – 2021-01-09 (×2): qty 15, 46d supply, fill #1
  Filled 2021-03-28: qty 15, 46d supply, fill #2
  Filled 2021-07-24: qty 15, 46d supply, fill #3
  Filled 2021-10-03: qty 12, 37d supply, fill #4
  Filled 2021-10-03: qty 3, 9d supply, fill #4

## 2020-10-09 MED ORDER — INSULIN PEN NEEDLE 32G X 4 MM MISC
3 refills | Status: DC
  Filled 2020-10-09: qty 300, 50d supply, fill #0
  Filled 2020-11-07 – 2020-11-18 (×2): qty 300, 50d supply, fill #1
  Filled 2021-03-28: qty 300, 50d supply, fill #2
  Filled 2021-06-08: qty 300, 50d supply, fill #3

## 2020-10-09 MED FILL — Continuous Glucose System Sensor: 90 days supply | Qty: 9 | Fill #0 | Status: AC

## 2020-10-09 NOTE — Progress Notes (Signed)
Patient ID: Maria Evans, female   DOB: [birth year], 55 y.o.   MRN: [id number]   This visit occurred during the SARS-CoV-2 public health emergency.  Safety protocols were in place, including screening questions prior to the visit, additional usage of staff PPE, and extensive cleaning of exam room while observing appropriate contact time as indicated for disinfecting solutions.   HPI: Maria Evans is a 55 y.o.-year-old very pleasant female, initially referred by her previous endocrinologist, Dr. Garvin Evans. [address] ([place], [place] - moved here end of 2020), returning for follow-up for DM1, diagnosed at 55 y/o, fairly well controlled, without long term complications and hypothyroidism. Last visit 4 months ago.  Interim history: She traveled since last visit, reviewed in 07/2020 and to [place] in 08/2020. Despite traveling, she feels that her sugars improved. She denies increased urination, blurry vision, or weight changes. She plans to elope later this spring and will move into her boyfriend's house afterwards.  DM1:  Reviewed HbA1c levels: Lab Results  Component Value Date   HGBA1C 6.3 (A) 06/09/2020   HGBA1C 6.9 (A) 03/09/2020  02/08/2019: HbA1c 7.5%  Previously on Omni pod 2017-fall 2020 but stopped after running out of supplies and had problems with the insertion sites.  She is currently on: - Tresiba 12-13 units daily >> at bedtime - NovoLog >> Humalog 2-6 units 3 times a day before meals (1:10 ICR, target 120, ISF 60-65) She tried inhaled insulin in the past but she had a persistently severe cough and had to stop. She tried Glimepiride - not very effective.  She was on the freestyle libre CGM but this was not covered by insurance so we sent a PA for the Dexcom CGM after last visit. Of note, in the past she mentions that she preferred a sensor without alarms.  However, she gets the Dexcom alarms on and she is not bothered by them.  She checks her sugars more than 4  times a day with her CGM.  CGM parameters:   Previously:   Lowest sugar was 20-30s before after starting insulin >> 45 >> 52 >> 50s; she has hypoglycemia awareness in the 70s. I sent a prescription for a glucagon kit at last visit. No previous hypoglycemia admission. She may drop her sugars after exercise -usually exercises earlier in the day. Highest sugar was >600 (pump failed) >> 399 >> 300. No previous DKA admissions.  Pt's meals are: - Breakfast: Fruit, peanut butter and banana sandwich, oatmeal with fruit - Lunch: [place] and cheese sandwich + pickle - Dinner: Home-cooked family meals - Snacks: 1 to 2 almonds, cheese, fruit, veggies  -+ History of stage III CKD, resolved after stopping NSAIDs; last BUN/creatinine:  Lab Results  Component Value Date   BUN 10 06/09/2020   Lab Results  Component Value Date   CREATININE 0.75 06/09/2020   -+ HL; last set of lipids: Lab Results  Component Value Date   CHOL 162 06/09/2020   HDL 73.50 06/09/2020   LDLCALC 79 06/09/2020   TRIG 44.0 06/09/2020   CHOLHDL 2 06/09/2020  On Lipitor 20 - moved to every other day.  - last eye exam was in 12/2019: No DR.   -No numbness and tingling in her feet.  No FH of DM.  Hypothyroidism:  Pt is on levothyroxine 100 mcg daily (dose decreased 07/2020), taken: - in am << moved from evening - fasting - at least 30 min from b'fast - no calcium - no iron - + multivitamins later  in the day - no PPIs - not on Biotin  Reviewed TSH levels: Lab Results  Component Value Date   TSH 0.27 (L) 07/28/2020   TSH 0.36 06/09/2020  02/09/2019: TSH 0.7474   Pt denies: - feeling nodules in neck - hoarseness - dysphagia - choking - SOB with lying down  She also has a history of anxiety/depression, migraines.  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  She works either early in the day or a later shift from 3 PM to 10 PM.  ROS: Constitutional: no weight gain/no weight loss, no fatigue, no subjective  hyperthermia, no subjective hypothermia Eyes: no blurry vision, no xerophthalmia ENT: no sore throat, + see HPI Cardiovascular: no CP/no SOB/no palpitations/no leg swelling Respiratory: no cough/no SOB/no wheezing Gastrointestinal: no N/no V/no D/no C/no acid reflux Musculoskeletal: no muscle aches/no joint aches Skin: no rashes, no hair loss Neurological: no tremors/no numbness/no tingling/no dizziness, + HAs  I reviewed pt's medications, allergies, PMH, social hx, family hx, and changes were documented in the history of present illness. Otherwise, unchanged from my initial visit note.  Past Medical History:  Diagnosis Date  . Anxiety   . Anxiety with depression    Controlled.  . Common migraine without aura   . Diabetes mellitus type I, controlled ([facility])    Treated with 4 times daily insulin based on blood sugar monitoring  . Femoral hernia 10/2019  . Hyperlipidemia due to type 1 diabetes mellitus (HCC)    Currently on atorvastatin.  Marland Kitchen Hypothyroid   . MVP (mitral valve prolapse) 2005   Renally diagnosed back in 2005 (during her pregnancy)_, most recently evaluated in 2012.  ([facility] - Cardiology, Dr. Delman Cheadle); 11/2010 TTE: Moderate MR, posteriorly directed (recommend TEE) => TEE (01/2011) - mild MVP (anterior leaflet) with Mild-Mod MR.;    Past Surgical History:  Procedure Laterality Date  . LAPAROSCOPIC LYSIS OF ADHESIONS    . LUMBAR MICRODISCECTOMY    . TRANSESOPHAGEAL ECHOCARDIOGRAM  02/13/2011   [facility] -> Dr. Delman Cheadle [facility]): EF 55 to 60%.  Borderline LA dilation.  Prolapse of A2 scallop of mitral valve-mild MVP with mild to moderate MR.  (2 eccentric jets, posteriorly directed, c/w anterior leaflet pathology))  . TRANSTHORACIC ECHOCARDIOGRAM  12/11/2010   [facility] -> Dr. Delman Cheadle [facility]): EF 35 to 6%.  Mild LA dilation.  Moderate MR-eccentric/possibly  directed jet consistent with antilipid pathology.  Mild TR, LP HTN, mild PR.  Recommend TEE.  Marland Kitchen TREADMILL STRESS ECHOCARDIOGRAM     [facility] -> Dr. Delman Cheadle [facility]): Exercised 10 minutes (11 minutes), achieved Max Heart Rate 162 = 92% Max Predicted Heart Rate, clinically and electrocardiographically negative.  Nonspecific ST and T wave changes.  Prestress echo showed EF 55 to 60% with normal wall motion.  Post-Stress: EF 65 to 70% with nl augmentation of all walls.=> NORMAL STRESS [facility]   Social History   Socioeconomic History  . Marital status: Significant Other    Spouse name: Not on file  . Number of children: 1  . Years of education: Not on file  . Highest education level: Not on file  Occupational History  . Occupation: Hospital pharmacist     Employer: [employer]  Tobacco Use  . Smoking status: Never Smoker  . Smokeless tobacco: Never Used  Vaping Use  . Vaping Use: Never used  Substance and Sexual Activity  . Alcohol use: Yes    Comment: occ,  wine or beer  . Drug use: Never  . Sexual activity: Yes    Birth control/protection: None  Other Topics Concern  . Not on file  Social History Narrative   She is currently divorced mother of 41 (54 year old son).   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    She is now with her longtime high school significant other after completing the divorce from her first husband.     She and her fianc intend to elope probably in May or June of this year.      She is an avid exerciser.  Enjoys jogging and walking.  She does not list 2 miles with treadmill and walks about 45 minutes most days - either indoors on the treadmill or outside.         Social Determinants of Health   Financial Resource Strain: Not on file  Food Insecurity: Not on file   Transportation Needs: Not on file  Physical Activity: Not on file  Stress: Not on file  Social Connections: Not on file  Intimate Partner Violence: Not on file   Current Outpatient Medications on File Prior to Visit  Medication Sig Dispense Refill  . ALPRAZolam (XANAX) 0.5 MG tablet Take 0.5 mg by mouth as needed for anxiety or sleep.    Marland Kitchen atorvastatin (LIPITOR) 20 MG tablet TAKE 1 TABLET BY MOUTH DAILY 90 tablet 3  . Continuous Blood Gluc Receiver (DEXCOM G6 RECEIVER) DEVI Use to check blood sugar 4 times daily 1 each 0  . Continuous Blood Gluc Sensor (DEXCOM G6 SENSOR) MISC USE WITH DEXCOM TO CHECK BLOOD SUGAR 4 TIMES DAILY 9 each 3  . Continuous Blood Gluc Transmit (DEXCOM G6 TRANSMITTER) MISC USE WITH DEXCOM TO CHECK BLOOD SUGAR 4 TIMES DAILY 1 each 3  . diclofenac (VOLTAREN) 75 MG EC tablet Take 75 mg by mouth 2 (two) times daily.    . diclofenac (VOLTAREN) 75 MG EC tablet TAKE 1 TABLET BY MOUTH 2 TIMES DAILY 180 tablet 1  . escitalopram (LEXAPRO) 20 MG tablet Take 20 mg by mouth daily.    Marland Kitchen escitalopram (LEXAPRO) 20 MG tablet TAKE 1 TABLET BY MOUTH ONCE A DAY 90 tablet 1  . escitalopram (LEXAPRO) 20 MG tablet TAKE 1 TABLET BY MOUTH ONCE DAILY 90 tablet 0  . escitalopram (LEXAPRO) 20 MG tablet Take 1 tablet by mouth once a day 90 tablet 0  . fluocinonide gel (LIDEX) 6.73 % Apply 1 application topically as needed (cold sores).    . Glucagon 3 MG/DOSE POWD PLACE 3 MG INTO THE NOSE ONCE AS NEEDED FOR UP TO 1 DOSE. 1 each 11  . glucose blood test strip USE TO TEST BLOOD SUGAR 3-4 TIMES A DAY 400 strip 1  . HUMALOG KWIKPEN 100 UNIT/ML KwikPen Inject 2-4 Units into the skin 4 (four) times daily.    Marland Kitchen ibuprofen (ADVIL) 800 MG tablet Take 800 mg by mouth every 8 (eight) hours as needed.    . insulin degludec (TRESIBA) 100 UNIT/ML FlexTouch Pen Inject 12 Units into the skin daily.    . Insulin Pen Needle 31G X 5 MM MISC USE AS DIRECTED WITH TRESIBA 100 each 2  . Insulin Pen Needle 31G X 5 MM  MISC USE AS DIRECTED WITH TRESIBA AND SHORT ACTING INSULIN 100 each 2  . levothyroxine (SYNTHROID) 100 MCG tablet TAKE  1 TABLET BY MOUTH ONCE A DAY BEFORE BREAKFAST 45 tablet 3  . Multiple Vitamin (MULTIVITAMIN) capsule Take 1 capsule by mouth daily.    . valACYclovir (VALTREX) 1000 MG tablet Take 1,000 mg by mouth as needed (fever blisters).     No current facility-administered medications on file prior to visit.   Allergies  Allergen Reactions  . Nitrofurantoin Itching and Other (See Comments)   Family History  Problem Relation Age of Onset  . Hyperlipidemia Mother   . Hyperlipidemia Father   . CAD Paternal Grandmother        Long-term smoker  . Heart attack Paternal Grandmother 75       Cause of death  . Stroke Paternal Grandmother   . Esophageal cancer Maternal Grandfather        Long-term smoker    PE: LMP 02/07/2016  Wt Readings from Last 3 Encounters:  09/18/20 151 lb 3.2 oz (68.6 kg)  08/22/20 151 lb 9.6 oz (68.8 kg)  08/07/20 152 lb 6.4 oz (69.1 kg)   Temp Readings from Last 3 Encounters:  08/25/20 97.7 F (36.5 C) (Oral)   BP Readings from Last 3 Encounters:  09/18/20 111/76  08/25/20 (!) 142/93  08/22/20 107/73   Pulse Readings from Last 3 Encounters:  09/18/20 76  08/25/20 85  08/22/20 69           Constitutional: normal weight, in NAD Eyes: PERRLA, EOMI, no exophthalmos ENT: moist mucous membranes, no thyromegaly, no cervical lymphadenopathy Cardiovascular: RRR, No MRG Respiratory: CTA B Gastrointestinal: abdomen soft, NT, ND, BS+ Musculoskeletal: no deformities, strength intact in all 4 Skin: moist, warm, no rashes Neurological: no tremor with outstretched hands, DTR normal in all 4  ASSESSMENT: 1. DM1, fairly well controlled, without long-term complications, but with hyperglycemia -She has a history of CKD stage III, which resolved after stopping NSAIDs  2. Hypothyroidism  PLAN:  1. Patient with longstanding, fairly well-controlled type  1 diabetes, previously on an OmniPod insulin pump, now on basal-bolus insulin regimen after she ran out of OmniPod supplies.  At last visit, HbA1c was better, at 6.3%!  At that time, she just switched from the freestyle libre CGM to her Dexcom CGM, per insurance preference.  We did not change the regimen at that time but I advised her to eliminate the snack at night or replacing with known carbohydrate foods to improve her overnight sugars and also to try to add healthy fats to higher glycemic index meals to allow her to bolus the entire insulin required for carbs without dropping her sugars after the meals.  She was also working on injecting NovoLog 15 minutes before meals.  Since last visit her insurance switched her to Humalog. CGM interpretation: -At today's visit, we reviewed her CGM downloads: It appears that 68.1% of values are in target range (goal >70%), while 27.4% are higher than 180 (goal <25%), and 4.5% are lower than 70 (goal <4%).  The calculated average blood sugar is 153 for the last 2 weeks.  The projected HbA1c for the next 3 months (GMI) is 7.0. -Reviewing the CGM trends, her sugars are better controlled during the day, with only a slight increase of blood sugars after dinner now, compared to last visit when most of her blood sugars were higher after this meal.  At last visit she also had a steep increase in blood sugars at night peaking around 12-1 AM.  Her sugars are high less frequently and they start increasing around 12 AM peaking  at 5 AM.  Upon questioning, she feels that she has problems controlling her diet and her blood sugars when she works from 3 PM to 10 PM.  She is eating popcorn many times at night, which she does cover with 2 units of insulin (24 g of carbs).  I advised her to try to change this to a dinner that contains protein and fat, or switch to nuts +/- berries.  Alternatively, if she continues with popcorn, I advised her to round up the dose to 3 units. -I do not feel  that we need to make other changes in her regimen for now.  However, since her insurance changed to covering Humalog, we discussed about potentially trying Lyumjev.  She will try to check with the pharmacy whether this is covered. -She appears to be doing excellent now without the insulin pump, so we will continue without it per her preference. - I suggested to: Patient Instructions  Please continue: - Tresiba 12-13 units at bedtime  Start: - Lyumjev 2-6 units 3 times a day before meals (1:10 ICR, target 120, ISF 60-65) - round up the dose of insulin before cereals  Please continue Levothyroxine 100 mcg daily.  Take the thyroid hormone every day, with water, at least 30 minutes before breakfast, separated by at least 4 hours from: - acid reflux medications - calcium - iron - multivitamins  Please stop at the lab.  Please return in 4 months.   - we checked her HbA1c: 6.2% (better) - advised to check sugars at different times of the day - 4x a day, rotating check times - advised for yearly eye exams >> she is UTD - return to clinic in 3-4 months  2. Hypothyroidism - latest thyroid labs reviewed with pt >> suppressed at last check, after which we decreased her levothyroxine dose: Lab Results  Component Value Date   TSH 0.27 (L) 07/28/2020  - she is now on LT4 100 mcg daily - pt feels good on this dose. - we discussed about taking the thyroid hormone every day, with water, >30 minutes before breakfast, separated by >4 hours from acid reflux medications, calcium, iron, multivitamins. Pt. is taking it correctly. - will check thyroid tests today: TSH and fT4 - If labs are abnormal, she will need to return for repeat TFTs in 1.5 months  Component     Latest Ref Rng & Units 10/09/2020  Hemoglobin A1C     4.0 - 5.6 % 6.2 (A)  TSH     0.35 - 4.50 uIU/mL 0.73  T4,Free(Direct)     0.60 - 1.60 ng/dL 0.94   TFTs are excellent.  We will continue the current levothyroxine  dose.  Philemon Kingdom, MD PhD [facility] Endocrinology

## 2020-10-09 NOTE — Patient Instructions (Addendum)
Please continue: - Tresiba 12-13 units at bedtime  Start: - Lyumjev 2-6 units 3 times a day before meals (1:10 ICR, target 120, ISF 60-65) - round up the dose of insulin before cereals  Please continue Levothyroxine 100 mcg daily.  Take the thyroid hormone every day, with water, at least 30 minutes before breakfast, separated by at least 4 hours from: - acid reflux medications - calcium - iron - multivitamins  Please stop at the lab.  Please return in 4 months.

## 2020-10-10 ENCOUNTER — Other Ambulatory Visit (HOSPITAL_COMMUNITY): Payer: Self-pay

## 2020-10-11 ENCOUNTER — Other Ambulatory Visit (HOSPITAL_COMMUNITY): Payer: Self-pay

## 2020-10-16 ENCOUNTER — Other Ambulatory Visit (HOSPITAL_BASED_OUTPATIENT_CLINIC_OR_DEPARTMENT_OTHER): Payer: Self-pay

## 2020-10-16 ENCOUNTER — Ambulatory Visit: Payer: 59 | Attending: Internal Medicine

## 2020-10-16 ENCOUNTER — Other Ambulatory Visit: Payer: Self-pay

## 2020-10-16 DIAGNOSIS — Z23 Encounter for immunization: Secondary | ICD-10-CM

## 2020-10-16 MED ORDER — PFIZER-BIONT COVID-19 VAC-TRIS 30 MCG/0.3ML IM SUSP
INTRAMUSCULAR | 0 refills | Status: DC
  Filled 2020-10-16: qty 0.3, 1d supply, fill #0

## 2020-10-16 NOTE — Progress Notes (Signed)
   Covid-19 Vaccination Clinic  Name:  Maria Evans    MRN: [id number] DOB: 1966/03/31  10/16/2020  [name] was observed post Covid-19 immunization for 15 minutes without incident. She was provided with Vaccine Information Sheet and instruction to access the V-Safe system.   [name] was instructed to call 911 with any severe reactions post vaccine: Marland Kitchen Difficulty breathing  . Swelling of face and throat  . A fast heartbeat  . A bad rash all over body  . Dizziness and weakness   Immunizations Administered    Name Date Dose VIS Date Route   PFIZER Comrnaty(Gray TOP) Covid-19 Vaccine 10/16/2020  2:15 PM 0.3 mL 06/01/2020 Intramuscular   Manufacturer: [place]   Lot: [id number]   NDC: [phone]

## 2020-10-31 ENCOUNTER — Other Ambulatory Visit (HOSPITAL_COMMUNITY): Payer: Self-pay

## 2020-10-31 MED FILL — Levothyroxine Sodium Tab 100 MCG: ORAL | 90 days supply | Qty: 90 | Fill #0 | Status: AC

## 2020-11-07 ENCOUNTER — Other Ambulatory Visit (HOSPITAL_COMMUNITY): Payer: Self-pay

## 2020-11-11 ENCOUNTER — Other Ambulatory Visit (HOSPITAL_COMMUNITY): Payer: Self-pay

## 2020-11-18 ENCOUNTER — Other Ambulatory Visit (HOSPITAL_COMMUNITY): Payer: Self-pay

## 2020-11-25 ENCOUNTER — Other Ambulatory Visit (HOSPITAL_COMMUNITY): Payer: Self-pay

## 2020-11-25 ENCOUNTER — Telehealth: Payer: 59 | Admitting: Nurse Practitioner

## 2020-11-25 DIAGNOSIS — N3000 Acute cystitis without hematuria: Secondary | ICD-10-CM | POA: Diagnosis not present

## 2020-11-25 MED ORDER — CEPHALEXIN 500 MG PO CAPS
500.0000 mg | ORAL_CAPSULE | Freq: Two times a day (BID) | ORAL | 0 refills | Status: DC
  Filled 2020-11-25: qty 14, 7d supply, fill #0

## 2020-11-25 NOTE — Progress Notes (Signed)

## 2020-12-05 ENCOUNTER — Other Ambulatory Visit (HOSPITAL_COMMUNITY): Payer: Self-pay

## 2020-12-05 MED FILL — Continuous Glucose System Transmitter: 90 days supply | Qty: 1 | Fill #0 | Status: AC

## 2020-12-07 IMAGING — US US PELVIS LIMITED
1 series · 9 of 9 positions shown · non-contrast
Comparison: None.

CLINICAL DATA: Swelling left groin region.

EXAM:
LIMITED ULTRASOUND OF PELVIS
TECHNIQUE: Limited transabdominal ultrasound examination of the pelvis was
performed.

[Series 1: us pelvis limited · 0.06mm/px · 9 of 9 slices shown]
[im 1/9]
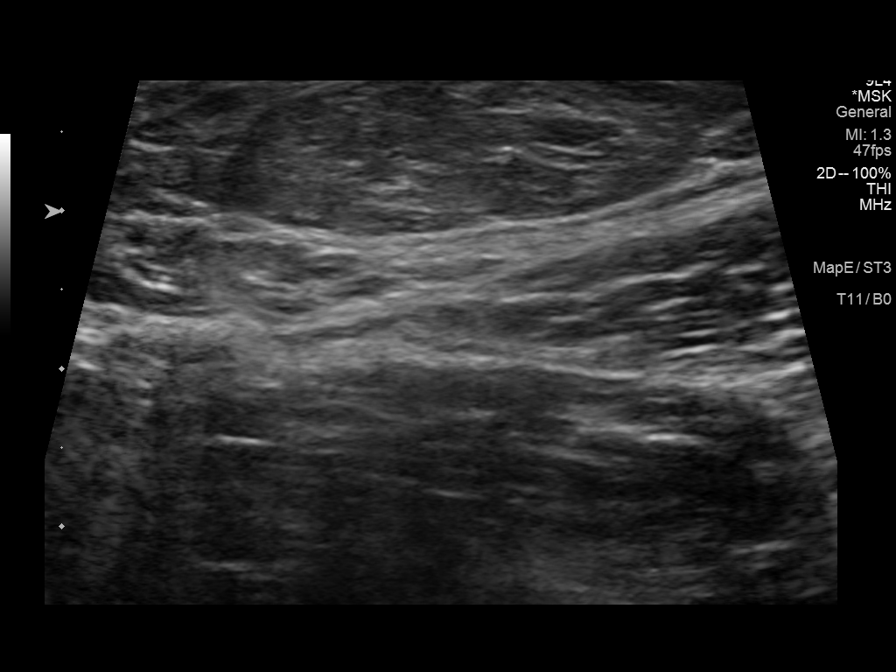
[im 2/9]
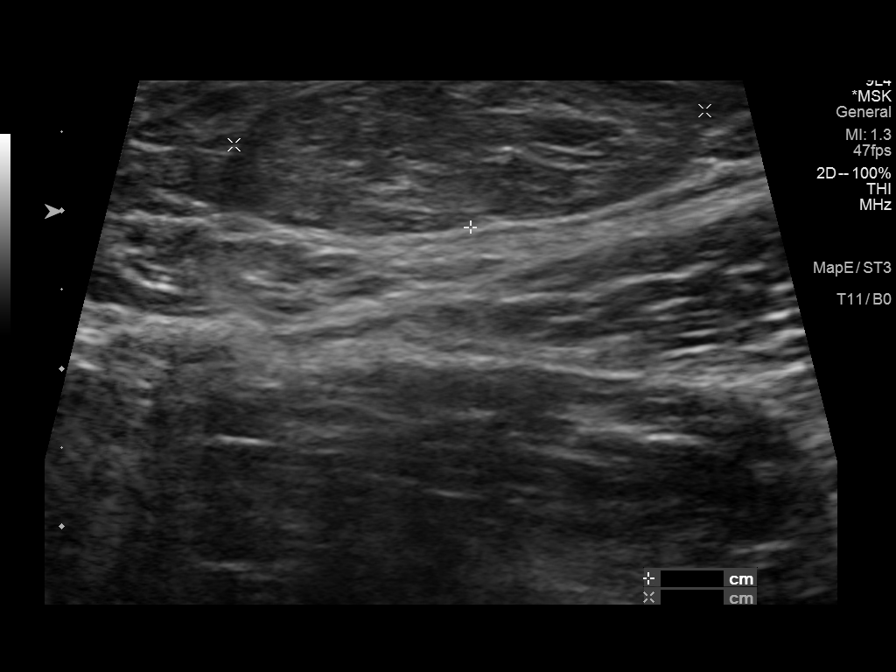
[im 3/9]
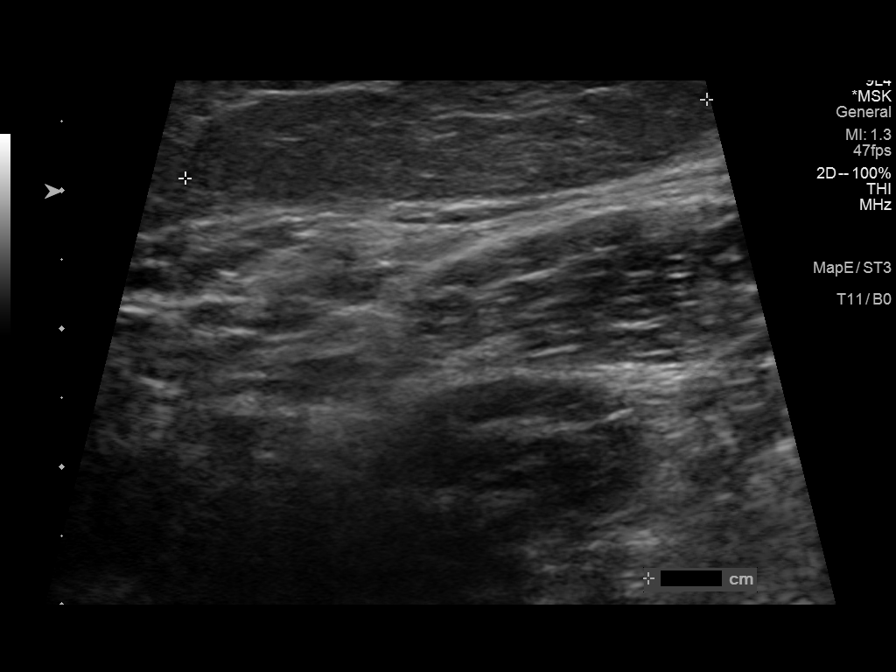
[im 4/9]
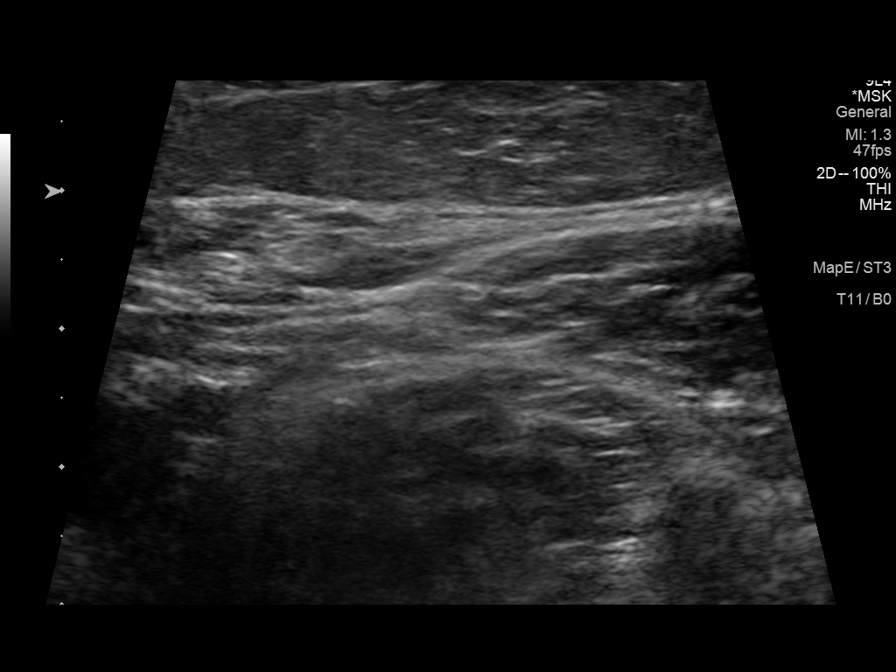
[im 5/9]
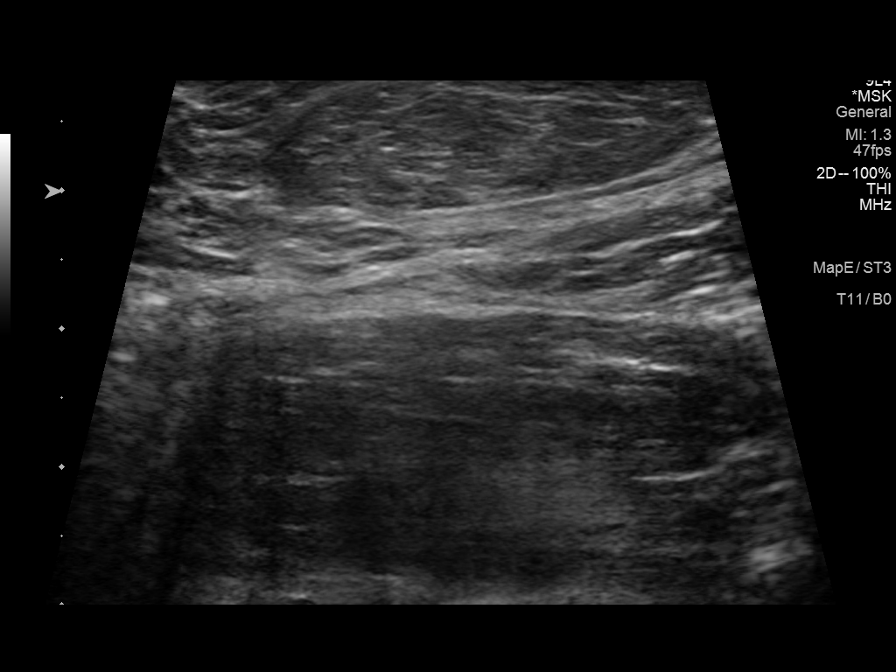
[im 6/9]
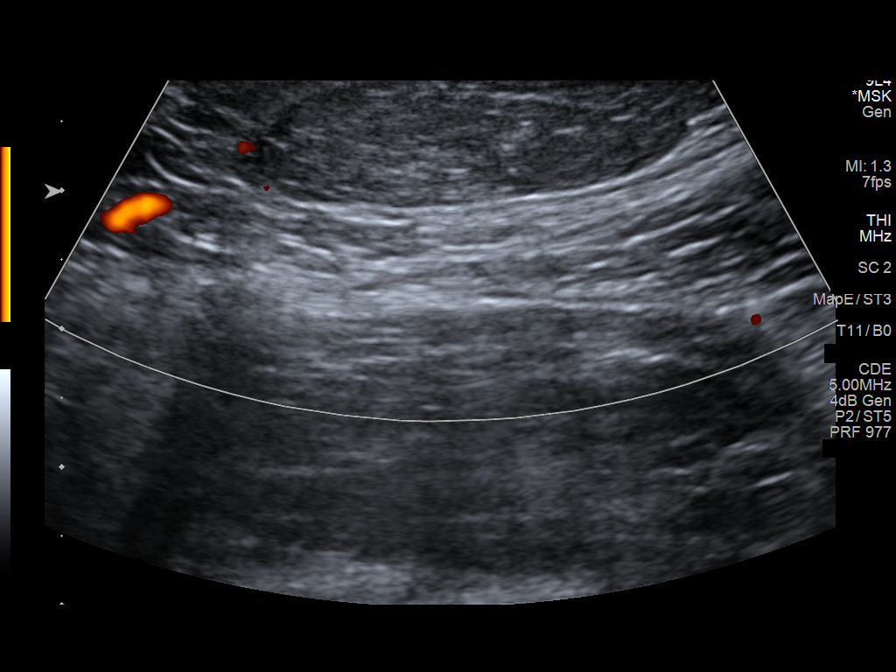
[im 7/9]
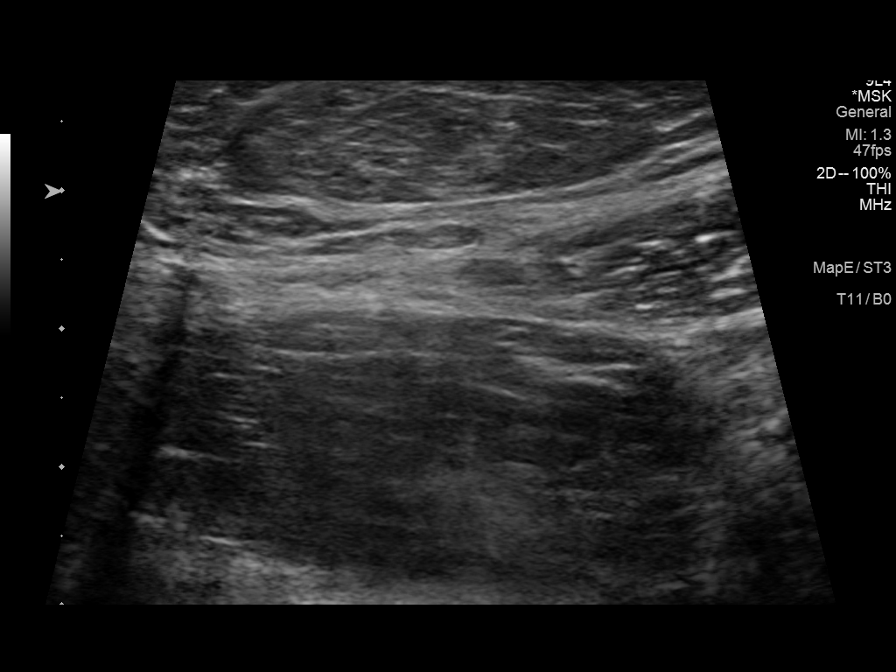
[im 8/9]
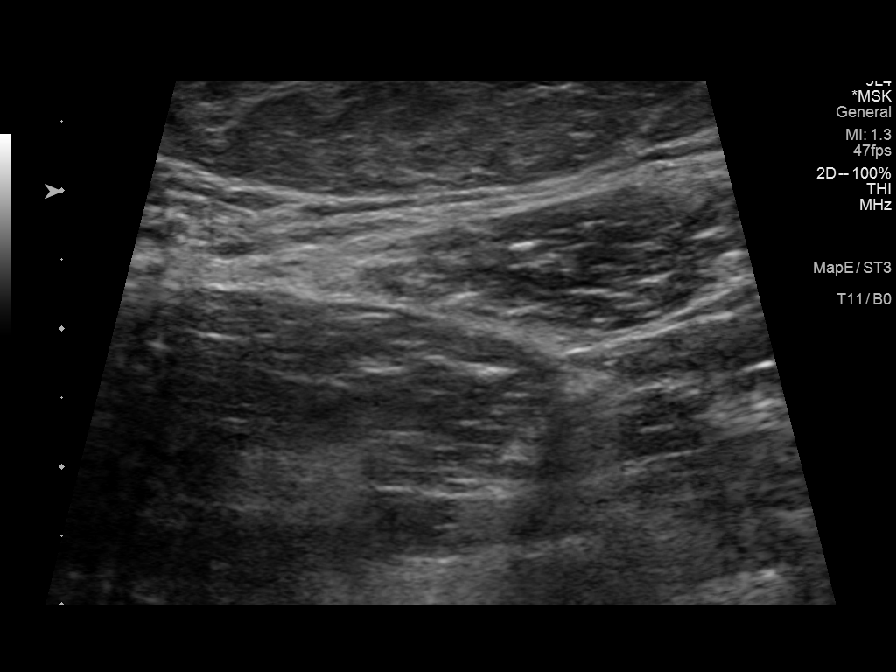
[im 9/9]
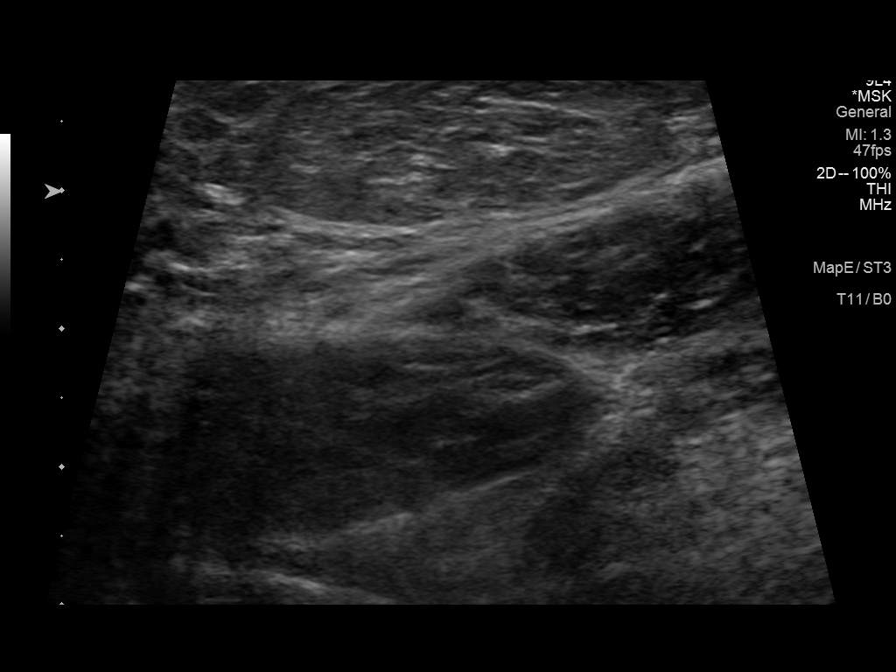

[9 of 9 positions shown; findings below may reference images not displayed]

FINDINGS: Scanning in the left groin demonstrates a subcutaneous mass
measuring 3.8 x 1.0 x 3.0 cm. This has heterogeneous echogenicity
which is greater than that expected for a lymph node. This may
represent a lipoma. No fluid collection identified. No cystic
change.
IMPRESSION: Palpable abnormality corresponds to a subcutaneous mass with
heterogeneous increased echogenicity suggestive of lipoma. If this
shows progressive growth, CT or MRI recommended.

## 2020-12-08 ENCOUNTER — Other Ambulatory Visit (HOSPITAL_COMMUNITY): Payer: Self-pay

## 2020-12-21 ENCOUNTER — Other Ambulatory Visit (HOSPITAL_COMMUNITY): Payer: Self-pay

## 2020-12-21 MED ORDER — ATORVASTATIN CALCIUM 20 MG PO TABS
20.0000 mg | ORAL_TABLET | ORAL | 0 refills | Status: DC
  Filled 2020-12-21: qty 45, 90d supply, fill #0

## 2020-12-22 ENCOUNTER — Telehealth: Payer: Self-pay | Admitting: Physician Assistant

## 2020-12-22 ENCOUNTER — Other Ambulatory Visit (HOSPITAL_COMMUNITY): Payer: Self-pay

## 2020-12-22 ENCOUNTER — Encounter: Payer: Self-pay | Admitting: Physician Assistant

## 2020-12-22 ENCOUNTER — Encounter (INDEPENDENT_AMBULATORY_CARE_PROVIDER_SITE_OTHER): Payer: Self-pay

## 2020-12-22 ENCOUNTER — Telehealth: Payer: 59 | Admitting: Physician Assistant

## 2020-12-22 DIAGNOSIS — U071 COVID-19: Secondary | ICD-10-CM

## 2020-12-22 MED ORDER — BENZONATATE 100 MG PO CAPS
100.0000 mg | ORAL_CAPSULE | Freq: Three times a day (TID) | ORAL | 0 refills | Status: DC | PRN
  Filled 2020-12-22: qty 30, 10d supply, fill #0

## 2020-12-22 MED ORDER — MOLNUPIRAVIR EUA 200MG CAPSULE
4.0000 | ORAL_CAPSULE | Freq: Two times a day (BID) | ORAL | 0 refills | Status: AC
  Filled 2020-12-22: qty 40, 5d supply, fill #0

## 2020-12-22 MED ORDER — CARESTART COVID-19 HOME TEST VI KIT
PACK | 0 refills | Status: DC
  Filled 2020-12-22: qty 4, 4d supply, fill #0

## 2020-12-22 NOTE — Progress Notes (Signed)
[name] are scheduled for a virtual visit with your provider today.    Just as we do with appointments in the office, we must obtain your consent to participate.  Your consent will be active for this visit and any virtual visit you may have with one of our providers in the next 365 days.    If you have a MyChart account, I can also send a copy of this consent to you electronically.  All virtual visits are billed to your insurance company just like a traditional visit in the office.  As this is a virtual visit, video technology does not allow for your provider to perform a traditional examination.  This may limit your provider's ability to fully assess your condition.  If your provider identifies any concerns that need to be evaluated in person or the need to arrange testing such as labs, EKG, etc, we will make arrangements to do so.    Although advances in technology are sophisticated, we cannot ensure that it will always work on either your end or our end.  If the connection with a video visit is poor, we may have to switch to a telephone visit.  With either a video or telephone visit, we are not always able to ensure that we have a secure connection.   I need to obtain your verbal consent now.   Are you willing to proceed with your visit today?   Maria Evans has provided verbal consent on 12/22/2020 for a virtual visit (video or telephone).   [name], PA-C 12/22/2020  7:56 AM  Virtual Visit Consent   Maria Evans, you are scheduled for a virtual visit with a [facility] provider today.     Just as with appointments in the office, your consent must be obtained to participate.  Your consent will be active for this visit and any virtual visit you may have with one of our providers in the next 365 days.     If you have a MyChart account, a copy of this consent can be sent to you electronically.  All virtual visits are billed to your insurance company just like a  traditional visit in the office.    As this is a virtual visit, video technology does not allow for your provider to perform a traditional examination.  This may limit your provider's ability to fully assess your condition.  If your provider identifies any concerns that need to be evaluated in person or the need to arrange testing (such as labs, EKG, etc.), we will make arrangements to do so.     Although advances in technology are sophisticated, we cannot ensure that it will always work on either your end or our end.  If the connection with a video visit is poor, the visit may have to be switched to a telephone visit.  With either a video or telephone visit, we are not always able to ensure that we have a secure connection.     I need to obtain your verbal consent now.   Are you willing to proceed with your visit today?    Sukaina Guadalupe Evans has provided verbal consent on 12/22/2020 for a virtual visit (video or telephone).   [name], PA-C   Date: 12/22/2020 7:56 AM   Virtual Visit via Video Note   I, [name], connected with  Maria Evans  ([id number], 1965-12-21) on 12/22/20 at  7:45 AM EDT by a video-enabled telemedicine application  and verified that I am speaking with the correct person using two identifiers.  Location: Patient: Virtual Visit Location Patient: Home Provider: Virtual Visit Location Provider: Home Office   I discussed the limitations of evaluation and management by telemedicine and the availability of in person appointments. The patient expressed understanding and agreed to proceed.    History of Present Illness: Maria Evans is a 55 y.o. who identifies as a female who was assigned female at birth, and is being seen today for Covid 19, tested positive this morning, 12/22/20.  HPI: URI  This is a new problem. The current episode started yesterday. The problem has been unchanged. Associated symptoms include congestion, coughing (dry), headaches  (faint) and a sore throat (scratchy). Pertinent negatives include no rhinorrhea or sinus pain.     Problems:  Patient Active Problem List   Diagnosis Date Noted   Type 1 diabetes mellitus with hyperglycemia ([place]) 08/22/2020   History of mitral valve prolapse in adulthood 08/23/2010    Allergies:  Allergies  Allergen Reactions   Nitrofurantoin Itching and Other (See Comments)   Medications:  Current Outpatient Medications:    benzonatate (TESSALON) 100 MG capsule, Take 1 capsule (100 mg total) by mouth 3 (three) times daily as needed., Disp: 30 capsule, Rfl: 0   molnupiravir EUA 200 mg CAPS, Take 4 capsules (800 mg total) by mouth 2 (two) times daily for 5 days., Disp: 40 capsule, Rfl: 0   ALPRAZolam (XANAX) 0.5 MG tablet, Take 0.5 mg by mouth as needed for anxiety or sleep., Disp: , Rfl:    atorvastatin (LIPITOR) 20 MG tablet, TAKE 1 TABLET BY MOUTH DAILY, Disp: 90 tablet, Rfl: 3   atorvastatin (LIPITOR) 20 MG tablet, Take 1 tablet (20 mg total) by mouth every other day., Disp: 45 tablet, Rfl: 0   cephALEXin (KEFLEX) 500 MG capsule, Take 1 capsule (500 mg total) by mouth 2 (two) times daily., Disp: 14 capsule, Rfl: 0   Continuous Blood Gluc Receiver (DEXCOM G6 RECEIVER) DEVI, Use to check blood sugar 4 times daily, Disp: 1 each, Rfl: 0   Continuous Blood Gluc Sensor (DEXCOM G6 SENSOR) MISC, USE WITH DEXCOM TO CHECK BLOOD SUGAR 4 TIMES DAILY, Disp: 9 each, Rfl: 3   Continuous Blood Gluc Transmit (DEXCOM G6 TRANSMITTER) MISC, USE WITH DEXCOM TO CHECK BLOOD SUGAR 4 TIMES DAILY, Disp: 1 each, Rfl: 3   COVID-19 mRNA Vac-TriS, Pfizer, (PFIZER-BIONT COVID-19 VAC-TRIS) SUSP injection, Inject into the muscle., Disp: 0.3 mL, Rfl: 0   diclofenac (VOLTAREN) 75 MG EC tablet, Take 75 mg by mouth 2 (two) times daily., Disp: , Rfl:    diclofenac (VOLTAREN) 75 MG EC tablet, TAKE 1 TABLET BY MOUTH 2 TIMES DAILY, Disp: 180 tablet, Rfl: 1   escitalopram (LEXAPRO) 20 MG tablet, Take 20 mg by mouth daily.,  Disp: , Rfl:    escitalopram (LEXAPRO) 20 MG tablet, TAKE 1 TABLET BY MOUTH ONCE A DAY, Disp: 90 tablet, Rfl: 1   escitalopram (LEXAPRO) 20 MG tablet, TAKE 1 TABLET BY MOUTH ONCE DAILY, Disp: 90 tablet, Rfl: 0   escitalopram (LEXAPRO) 20 MG tablet, Take 1 tablet by mouth once a day, Disp: 90 tablet, Rfl: 0   fluocinonide gel (LIDEX) 9.35 %, Apply 1 application topically as needed (cold sores)., Disp: , Rfl:    Glucagon 3 MG/DOSE POWD, PLACE 3 MG INTO THE NOSE ONCE AS NEEDED FOR UP TO 1 DOSE., Disp: 1 each, Rfl: 11   glucose blood test strip, USE TO TEST BLOOD  SUGAR 3-4 TIMES A DAY, Disp: 400 strip, Rfl: 1   insulin degludec (TRESIBA FLEXTOUCH) 100 UNIT/ML FlexTouch Pen, INJECT UP TO 13 UNITS UNDER SKIN DAILY, Disp: 9 mL, Rfl: 3   insulin degludec (TRESIBA) 100 UNIT/ML FlexTouch Pen, Inject 12-13 Units into the skin daily., Disp: , Rfl:    Insulin Lispro-aabc, 1 U Dial, (LYUMJEV KWIKPEN) 100 UNIT/ML SOPN, Inject up to 8 units before each meal 3-4x a day, Disp: 15 mL, Rfl: 11   Insulin Pen Needle 32G X 4 MM MISC, Use 4 - 6 times daily as directed, Disp: 300 each, Rfl: 3   levothyroxine (SYNTHROID) 100 MCG tablet, TAKE 1 TABLET BY MOUTH ONCE A DAY BEFORE BREAKFAST, Disp: 45 tablet, Rfl: 3   Multiple Vitamin (MULTIVITAMIN) capsule, Take 1 capsule by mouth daily., Disp: , Rfl:    valACYclovir (VALTREX) 1000 MG tablet, Take 1,000 mg by mouth as needed (fever blisters)., Disp: , Rfl:   Observations/Objective: Patient is well-developed, well-nourished in no acute distress.  Resting comfortably at home.  Head is normocephalic, atraumatic.  No labored breathing. Dry, deep, barking cough heard x 1 Speech is clear and coherent with logical content.  Patient is alert and oriented at baseline.   Assessment and Plan: 1. COVID-19 - molnupiravir EUA 200 mg CAPS; Take 4 capsules (800 mg total) by mouth 2 (two) times daily for 5 days.  Dispense: 40 capsule; Refill: 0 - benzonatate (TESSALON) 100 MG capsule;  Take 1 capsule (100 mg total) by mouth 3 (three) times daily as needed.  Dispense: 30 capsule; Refill: 0 - MyChart COVID-19 home monitoring program; Future - Temperature monitoring; Future - Continue OTC symptomatic management of choice - Will send OTC vitamins and supplement information through AVS - Patient enrolled in MyChart symptom monitoring - Push fluids - Rest as needed - Discussed return precautions and when to seek in-person evaluation, sent via AVS as well  Follow Up Instructions: I discussed the assessment and treatment plan with the patient. The patient was provided an opportunity to ask questions and all were answered. The patient agreed with the plan and demonstrated an understanding of the instructions.  A copy of instructions were sent to the patient via MyChart.  The patient was advised to call back or seek an in-person evaluation if the symptoms worsen or if the condition fails to improve as anticipated.  Time:  I spent 15 minutes with the patient via telehealth technology discussing the above problems/concerns.    [name], PA-C

## 2020-12-22 NOTE — Telephone Encounter (Signed)
Pt's answers on Covid questionnaire fired BPA. I left message with information related to cough. Number given to call back if any questions.

## 2020-12-22 NOTE — Patient Instructions (Signed)
Hello Maria Evans,  You are being placed in the home monitoring program for COVID-19 (commonly known as Coronavirus).  This is because you are suspected to have the virus or are known to have the virus.  If you are unsure which group you fall into call your clinic.    As part of this program, you'll answer a daily questionnaire in the MyChart mobile app. You'll receive a notification through the MyChart app when the questionnaire is available. When you log in to MyChart, you'll see the tasks in your To Do activity.       Clinicians will see any answers that are concerning and take appropriate steps.  If at any point you are having a medical emergency, call 911.  If otherwise concerned call your clinic instead of coming into the clinic or hospital.  To keep from spreading the disease you should: Stay home and limit contact with other people as much as possible.  Wash your hands frequently. Cover your coughs and sneezes with a tissue, and throw used tissues in the trash.   Clean and disinfect frequently touched surfaces and objects.    Take care of yourself by: Staying home Resting Drinking fluids Take fever-reducing medications (Tylenol/Acetaminophen and Ibuprofen)  For more information on the disease go to the Centers for Disease Control and Prevention website     You are being prescribed MOLNUPIRAVIR for COVID-19 infection.   Please pick up your prescription at: [facility] pharmacy  Address:  [address] [place], [place] [address]         Hours:  Monday - Friday 7:30 am - 6PM                         Saturday 8:00 am - 4:30 pm      Sunday Closed  Phone: [phone]  Please call the pharmacy or go through the drive through vs going inside if you are picking up the mediation yourself to prevent further spread. If prescribed to a [facility] affiliated pharmacy, a pharmacist will bring the medication out to your car.   ADMINISTRATION INSTRUCTIONS: Take with or without food. Swallow  the tablets whole. Don't chew, crush, or break the medications because it might not work as well  For each dose of the medication, you should be taking FOUR tablets at one time, TWICE a day   Finish your full five-day course of Molnupiravir even if you feel better before you're done. Stopping this medication too early can make it less effective to prevent severe illness related to [place].    Molnupiravir is prescribed for YOU ONLY. Don't share it with others, even if they have similar symptoms as you. This medication might not be right for everyone.   Make sure to take steps to protect yourself and others while you're taking this medication in order to get well soon and to prevent others from getting sick with COVID-19.   **If you are of childbearing potential (any gender) - it is advised to not get pregnant while taking this medication and recommended that condoms are used for female partners the next 3 months after taking the medication out of extreme caution    COMMON SIDE EFFECTS: Diarrhea Nausea  Dizziness    If your COVID-19 symptoms get worse, get medical help right away. Call 911 if you experience symptoms such as worsening cough, trouble breathing, chest pain that doesn't go away, confusion, a hard time staying awake, and pale or blue-colored  skin. This medication won't prevent all COVID-19 cases from getting worse.  Can take to lessen severity: Vit C 500mg  twice daily Quercertin 250-500mg  twice daily Zinc 75-100mg  daily Melatonin 3-6 mg at bedtime Vit D3 1000-2000 IU daily Aspirin 81 mg daily with food Optional: Famotidine 20mg  daily Also can add tylenol/ibuprofen as needed for fevers and body aches May add Mucinex or Mucinex DM as needed for cough/congestion   10 Things You Can Do to Manage Your COVID-19 Symptoms at Home If you have possible or confirmed COVID-19 Stay home except to get medical care. Monitor your symptoms carefully. If your symptoms get worse, call  your healthcare provider immediately. Get rest and stay hydrated. If you have a medical appointment, call the healthcare provider ahead of time and tell them that you have or may have COVID-19. For medical emergencies, call 911 and notify the dispatch personnel that you have or may have COVID-19. Cover your cough and sneezes with a tissue or use the inside of your elbow. Wash your hands often with soap and water for at least 20 seconds or clean your hands with an alcohol-based hand sanitizer that contains at least 60% alcohol. As much as possible, stay in a specific room and away from other people in your home. Also, you should use a separate bathroom, if available. If you need to be around other people in or outside of the home, wear a mask. Avoid sharing personal items with other people in your household, like dishes, towels, and bedding. Clean all surfaces that are touched often, like counters, tabletops, and doorknobs. Use household cleaning sprays or wipes according to the label instructions. June 01/07/2020 This information is not intended to replace advice given to you by your health care provider. Make sure you discuss any questions you have with your healthcare provider. Document Revised: 07/28/2020 Document Reviewed: 07/28/2020 Elsevier Patient Education  2022 [place]: What to Do if You Are Sick CDC has updated isolation and quarantine recommendations for the public, and is revising the CDC website to reflect these changes. These recommendations do not apply to healthcare personnel and do not supersede state, local, tribal, or territorial laws, rules, andregulations. If you have a fever, cough or other symptoms, you might have COVID-19. Most people have mild illness and are able to recover at home. If you are sick: Keep track of your symptoms. If you have an emergency warning sign (including trouble breathing), call 911. Steps to help prevent the spread  of COVID-19 if you are sick If you are sick with COVID-19 or think you might have COVID-19, follow the steps below to care for yourself and to help protect other peoplein your home and community. Stay home except to get medical care Stay home. Most people with COVID-19 have mild illness and can recover at home without medical care. Do not leave your home, except to get medical care. Do not visit public areas. Take care of yourself. Get rest and stay hydrated. Take over-the-counter medicines, such as acetaminophen, to help you feel better. Stay in touch with your doctor. Call before you get medical care. Be sure to get care if you have trouble breathing, or have any other emergency warning signs, or if you think it is an emergency. Avoid public transportation, ride-sharing, or taxis. Separate yourself from other people As much as possible, stay in a specific room and away from other people and pets in your home. If possible, you should use a separate bathroom. If you  need to be around other people or animals in oroutside of the home, wear a mask. Tell your close contactsthat they may have been exposed to COVID-19. An infected person can spread COVID-19 starting 48 hours (or 2 days) before the person has any symptoms or tests positive. By letting your close contacts know they may have been exposed to COVID-19, you are helping to protect everyone. Additional guidance is available for those living in close quarters and shared housing. See COVID-19 and Animals if you have questions about pets. If you are diagnosed with COVID-19, someone from the health department may call you. Answer the call to slow the spread. Monitor your symptoms Symptoms of COVID-19 include fever, cough, or other symptoms. Follow care instructions from your healthcare provider and local health department. Your local health authorities may give instructions on checking your symptoms and reporting information. When to seek emergency  medical attention Look for emergency warning signs* for COVID-19. If someone is showing any of these signs, seek emergency medical care immediately: Trouble breathing Persistent pain or pressure in the chest New confusion Inability to wake or stay awake Pale, gray, or blue-colored skin, lips, or nail beds, depending on skin tone *This list is not all possible symptoms. Please call your medical provider forany other symptoms that are severe or concerning to you. Call 911 or call ahead to your local emergency facility: Notify the operator that you are seeking care for someone who has or may haveCOVID-19. Call ahead before visiting your doctor Call ahead. Many medical visits for routine care are being postponed or done by phone or telemedicine. If you have a medical appointment that cannot be postponed, call your doctor's office, and tell them you have or may have COVID-19. This will help the office protect themselves and other patients. Get tested If you have symptoms of COVID-19, get tested. While waiting for test results, you stay away from others, including staying apart from those living in your household. Self-tests are one of several options for testing for the virus that causes COVID-19 and may be more convenient than laboratory-based tests and point-of-care tests. Ask your healthcare provider or your local health department if you need help interpreting your test results. You can visit your state, tribal, local, and territorial health department's website to look for the latest local information on testing sites. If you are sick, wear a mask over your nose and mouth You should wear a mask over your nose and mouth if you must be around other people or animals, including pets (even at home). You don't need to wear the mask if you are alone. If you can't put on a mask (because of trouble breathing, for example), cover your coughs and sneezes in some other way. Try to stay at least 6 feet away  from other people. This will help protect the people around you. Masks should not be placed on young children under age 47 years, anyone who has trouble breathing, or anyone who is not able to remove the mask without help. Note: During the COVID-19 pandemic, medical grade facemasks are reserved forhealthcare workers and some first responders. Cover your coughs and sneezes Cover your mouth and nose with a tissue when you cough or sneeze. Throw away used tissues in a lined trash can. Immediately wash your hands with soap and water for at least 20 seconds. If soap and water are not available, clean your hands with an alcohol-based hand sanitizer that contains at least 60% alcohol. Clean your hands often  Wash your hands often with soap and water for at least 20 seconds. This is especially important after blowing your nose, coughing, or sneezing; going to the bathroom; and before eating or preparing food. Use hand sanitizer if soap and water are not available. Use an alcohol-based hand sanitizer with at least 60% alcohol, covering all surfaces of your hands and rubbing them together until they feel dry. Soap and water are the best option, especially if hands are visibly dirty. Avoid touching your eyes, nose, and mouth with unwashed hands. Handwashing Tips Avoid sharing personal household items Do not share dishes, drinking glasses, cups, eating utensils, towels, or bedding with other people in your home. Wash these items thoroughly after using them with soap and water or put in the dishwasher. Clean all "high-touch" surfaces every day Clean and disinfect high-touch surfaces in your "sick room" and bathroom; wear disposable gloves. Let someone else clean and disinfect surfaces in common areas, but you should clean your bedroom and bathroom, if possible. If a caregiver or other person needs to clean and disinfect a sick person's bedroom or bathroom, they should do so on an as-needed basis. The  caregiver/other person should wear a mask and disposable gloves prior to cleaning. They should wait as long as possible after the person who is sick has used the bathroom before coming in to clean and use the bathroom. High-touch surfaces include phones, remote controls, counters, tabletops, doorknobs, bathroom fixtures, toilets, keyboards, tablets, and bedside tables. Clean and disinfect areas that may have blood, stool, or body fluids on them. Use household cleaners and disinfectants. Clean the area or item with soap and water or another detergent if it is dirty. Then, use a household disinfectant. Be sure to follow the instructions on the label to ensure safe and effective use of the product. Many products recommend keeping the surface wet for several minutes to ensure germs are killed. Many also recommend precautions such as wearing gloves and making sure you have good ventilation during use of the product. Use a product from [employer] List N: Disinfectants for Coronavirus (WSFKC-12). Complete Disinfection Guidance When you can be around others after being sick with COVID-19 Deciding when you can be around others is different for different situations. Find out when you can safely end home isolation. For any additional questions about your care,contact your healthcare provider or state or local health department. 05/31/2020 Content source: [facility] for Immunization and Respiratory Diseases ([facility]), Division of Viral Diseases This information is not intended to replace advice given to you by your health care provider. Make sure you discuss any questions you have with your healthcare provider. Document Revised: 07/28/2020 Document Reviewed: 07/28/2020 Elsevier Patient Education  [place].

## 2020-12-23 ENCOUNTER — Telehealth: Payer: Self-pay

## 2020-12-23 ENCOUNTER — Encounter (INDEPENDENT_AMBULATORY_CARE_PROVIDER_SITE_OTHER): Payer: Self-pay

## 2020-12-23 NOTE — Telephone Encounter (Signed)
Called pt and discussed sx and home care of poor appetite. Pt was able to eat and peanut butter and jelly sandwich today. Encouraged pt to try and eat that and bland foods or anything that she wants. Advised pt to try and avoid spicy and greasy foods.  Advised pt to notify her PCP if: SOB at rest, unable to eat or drink, severe vomiting, abdominal pain; saline nasal flushes; eating hard candy for throat; using a humidifier for moisture in air; taking 2 teaspoons of honey at bedtime to act as a cough suppressant; drink at least 6-8 glasses of hot or cold fluids; signs of dehydration; notify PCP for chest phlegm that is not clear or white or and how and when to treat fever above 101 or less than 101.  Pt verbalized understanding. Provided pt with covid line of [phone]. Discuss using MyChart for virtual visits.

## 2020-12-29 ENCOUNTER — Other Ambulatory Visit (HOSPITAL_COMMUNITY): Payer: Self-pay

## 2020-12-29 MED ORDER — ESCITALOPRAM OXALATE 20 MG PO TABS
ORAL_TABLET | ORAL | 0 refills | Status: DC
  Filled 2020-12-29: qty 90, 90d supply, fill #0

## 2021-01-06 ENCOUNTER — Other Ambulatory Visit (HOSPITAL_COMMUNITY): Payer: Self-pay

## 2021-01-09 ENCOUNTER — Other Ambulatory Visit (HOSPITAL_COMMUNITY): Payer: Self-pay

## 2021-01-23 ENCOUNTER — Other Ambulatory Visit (HOSPITAL_COMMUNITY): Payer: Self-pay

## 2021-01-23 MED FILL — Continuous Glucose System Sensor: 30 days supply | Qty: 3 | Fill #1 | Status: AC

## 2021-01-23 MED FILL — Continuous Glucose System Sensor: 60 days supply | Qty: 6 | Fill #1 | Status: AC

## 2021-01-24 ENCOUNTER — Other Ambulatory Visit (HOSPITAL_COMMUNITY): Payer: Self-pay

## 2021-01-29 DIAGNOSIS — Z794 Long term (current) use of insulin: Secondary | ICD-10-CM | POA: Diagnosis not present

## 2021-01-29 DIAGNOSIS — E785 Hyperlipidemia, unspecified: Secondary | ICD-10-CM | POA: Diagnosis not present

## 2021-01-29 DIAGNOSIS — I341 Nonrheumatic mitral (valve) prolapse: Secondary | ICD-10-CM | POA: Diagnosis not present

## 2021-01-29 DIAGNOSIS — F419 Anxiety disorder, unspecified: Secondary | ICD-10-CM | POA: Diagnosis not present

## 2021-01-29 DIAGNOSIS — B001 Herpesviral vesicular dermatitis: Secondary | ICD-10-CM | POA: Diagnosis not present

## 2021-01-29 DIAGNOSIS — G8929 Other chronic pain: Secondary | ICD-10-CM | POA: Diagnosis not present

## 2021-01-29 DIAGNOSIS — E1165 Type 2 diabetes mellitus with hyperglycemia: Secondary | ICD-10-CM | POA: Diagnosis not present

## 2021-01-29 DIAGNOSIS — E039 Hypothyroidism, unspecified: Secondary | ICD-10-CM | POA: Diagnosis not present

## 2021-01-29 DIAGNOSIS — M545 Low back pain, unspecified: Secondary | ICD-10-CM | POA: Diagnosis not present

## 2021-01-31 ENCOUNTER — Other Ambulatory Visit (HOSPITAL_COMMUNITY): Payer: Self-pay

## 2021-02-01 ENCOUNTER — Other Ambulatory Visit (HOSPITAL_COMMUNITY): Payer: Self-pay

## 2021-02-01 MED ORDER — FLUOCINONIDE 0.05 % EX GEL
CUTANEOUS | 1 refills | Status: DC
  Filled 2021-02-01: qty 30, 30d supply, fill #0

## 2021-02-01 MED ORDER — ALPRAZOLAM 0.5 MG PO TABS
0.5000 mg | ORAL_TABLET | Freq: Every evening | ORAL | 0 refills | Status: DC | PRN
  Filled 2021-02-01: qty 30, 30d supply, fill #0

## 2021-02-02 ENCOUNTER — Other Ambulatory Visit (HOSPITAL_COMMUNITY): Payer: Self-pay

## 2021-02-07 ENCOUNTER — Other Ambulatory Visit: Payer: Self-pay

## 2021-02-07 ENCOUNTER — Ambulatory Visit: Payer: 59 | Admitting: Podiatry

## 2021-02-07 DIAGNOSIS — Q828 Other specified congenital malformations of skin: Secondary | ICD-10-CM | POA: Diagnosis not present

## 2021-02-07 DIAGNOSIS — L989 Disorder of the skin and subcutaneous tissue, unspecified: Secondary | ICD-10-CM | POA: Diagnosis not present

## 2021-02-09 ENCOUNTER — Other Ambulatory Visit (HOSPITAL_COMMUNITY): Payer: Self-pay

## 2021-02-12 ENCOUNTER — Ambulatory Visit: Payer: 59 | Admitting: Internal Medicine

## 2021-02-13 ENCOUNTER — Ambulatory Visit: Payer: 59 | Admitting: Internal Medicine

## 2021-02-13 ENCOUNTER — Encounter: Payer: Self-pay | Admitting: Internal Medicine

## 2021-02-13 ENCOUNTER — Other Ambulatory Visit: Payer: Self-pay

## 2021-02-13 VITALS — BP 110/70 | HR 74 | Ht 68.0 in | Wt 151.2 lb

## 2021-02-13 DIAGNOSIS — E1065 Type 1 diabetes mellitus with hyperglycemia: Secondary | ICD-10-CM

## 2021-02-13 DIAGNOSIS — E039 Hypothyroidism, unspecified: Secondary | ICD-10-CM | POA: Diagnosis not present

## 2021-02-13 LAB — POCT GLYCOSYLATED HEMOGLOBIN (HGB A1C): Hemoglobin A1C: 6.3 % — AB (ref 4.0–5.6)

## 2021-02-13 NOTE — Patient Instructions (Addendum)
Please continue: - Tresiba 12-13 units at bedtime - Lyumjev 2-6 units 3 times a day before meals + correction  - 1:10 ICR, target 120, ISF 60-65  - 1:8-9 ICR with carbs  Try to use a lower Lyumjev dose when you plan to be active after a meal (e.g. ICR of 1:12)  Please continue Levothyroxine 100 mcg daily.  Take the thyroid hormone every day, with water, at least 30 minutes before breakfast, separated by at least 4 hours from: - acid reflux medications - calcium - iron - multivitamins  Please return in 4 months.

## 2021-02-13 NOTE — Progress Notes (Signed)
Patient ID: KATHEEN ASLIN, female   DOB: 1965-06-27, 55 y.o.   MRN: [id number]   This visit occurred during the SARS-CoV-2 public health emergency.  Safety protocols were in place, including screening questions prior to the visit, additional usage of staff PPE, and extensive cleaning of exam room while observing appropriate contact time as indicated for disinfecting solutions.   HPI: BANITA LEHN is a 55 y.o.-year-old very pleasant female, initially referred by her previous endocrinologist, Dr. Garvin Fila. [address] ([place], [place] - moved here end of 2020), returning for follow-up for DM1, diagnosed at 55 y/o, fairly well controlled, without long term complications and also hypothyroidism. Last visit 4 months ago.  Interim history: No increased urination, blurry vision, nausea, chest pain. She is having hot flushes. She has a mild HA/feels her pulse in her temporal R region. Has teeth grinding and jaw clenching. Wears a retainer. She got married this summer. Her son is preparing for college - he is in 12th grade.  DM1:  Reviewed HbA1c levels: Lab Results  Component Value Date   HGBA1C 6.2 (A) 10/09/2020   HGBA1C 6.3 (A) 06/09/2020   HGBA1C 6.9 (A) 03/09/2020  02/08/2019: HbA1c 7.5%  Previously on Omni pod 2017-fall 2020 but stopped after running out of supplies and had problems with the insertion sites.  At last visit she was on: - Tresiba 12-13 units daily >> at bedtime - NovoLog >> Humalog 2-6 units 3 times a day before meals (1:10 ICR, target 120, ISF 60-65) She tried inhaled insulin in the past but she had a persistently severe cough and had to stop. She tried Glimepiride - not very effective.  I advised her to use the following regimen: - Tresiba 12-13 >> 12 units at bedtime - Lyumjev 2-6 units 3 times a day before meals (1:10 ICR, target 120, ISF 60-65) - round up the dose of insulin before cereals  She was on the freestyle libre CGM but this was not covered  by insurance so she is now on a Dexcom CGM.  She checks her sugars more than 4 times a day with her CGM.   Previously:   Lowest sugar was 20-30s before after starting insulin ...>> 50s >> 39; she has hypoglycemia awareness in the 49s. I sent a prescription for a glucagon kit at last visit. No previous hypoglycemia admission. She may drop her sugars after exercise -usually exercises earlier in the day. Highest sugar was >600 (pump failed) >> 399 >> 300 >> 401. No previous DKA admissions.  Pt's meals are: - Breakfast: Fruit, peanut butter and banana sandwich, oatmeal with fruit - Lunch: [place] and cheese sandwich + pickle - Dinner: Home-cooked family meals - Snacks: 1 to 2 almonds, cheese, fruit, veggies  -+ History of stage III CKD, resolved after stopping NSAIDs; last BUN/creatinine:  Lab Results  Component Value Date   BUN 10 06/09/2020   Lab Results  Component Value Date   CREATININE 0.75 06/09/2020   -+ HL; last set of lipids: Lab Results  Component Value Date   CHOL 162 06/09/2020   HDL 73.50 06/09/2020   LDLCALC 79 06/09/2020   TRIG 44.0 06/09/2020   CHOLHDL 2 06/09/2020  On Lipitor 20 - moved to every other day.  - last eye exam was in 12/2019: No DR. Coming up in 02/2020.  -No numbness and tingling in her feet.  No FH of DM.  Hypothyroidism:  Pt is on levothyroxine 100 mcg daily (dose decreased 07/2020), taken: - in  am << moved from evening - fasting - at least 30 min from b'fast - no calcium - no iron - + multivitamins later in the day - no PPIs - not on Biotin  Reviewed TSH levels: Lab Results  Component Value Date   TSH 0.73 10/09/2020   TSH 0.27 (L) 07/28/2020   TSH 0.36 06/09/2020  02/09/2019: TSH 0.7474   Pt denies: - feeling nodules in neck - hoarseness - dysphagia - choking - SOB with lying down  She also has a history of anxiety/depression, migraines.  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  She works either early in the day or a  later shift from 3 PM to 10 PM.  ROS: + See HPI  I reviewed pt's medications, allergies, PMH, social hx, family hx, and changes were documented in the history of present illness. Otherwise, unchanged from my initial visit note.  Past Medical History:  Diagnosis Date   Anxiety    Anxiety with depression    Controlled.   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   Hypothyroid    MVP (mitral valve prolapse) 2005   Renally diagnosed back in 2005 (during her pregnancy)_, most recently evaluated in 2012.  ([facility] - [facility]iology, Dr. Delman Cheadle); 11/2010 TTE: Moderate MR, posteriorly directed (recommend TEE) => TEE (01/2011) - mild MVP (anterior leaflet) with Mild-Mod MR.;    Past Surgical History:  Procedure Laterality Date   LAPAROSCOPIC LYSIS OF ADHESIONS     LUMBAR MICRODISCECTOMY     TRANSESOPHAGEAL ECHOCARDIOGRAM  02/13/2011   [facility] -> Dr. Delman Cheadle [facility]): EF 55 to 60%.  Borderline LA dilation.  Prolapse of A2 scallop of mitral valve-mild MVP with mild to moderate MR.  (2 eccentric jets, posteriorly directed, c/w anterior leaflet pathology))   TRANSTHORACIC ECHOCARDIOGRAM  12/11/2010   [facility] -> Dr. Delman Cheadle [facility]): EF 35 to 6%.  Mild LA dilation.  Moderate MR-eccentric/possibly directed jet consistent with antilipid pathology.  Mild TR, LP HTN, mild PR.  Recommend TEE.   TREADMILL STRESS ECHOCARDIOGRAM     [facility] -> Dr. Delman Cheadle [facility]): Exercised 10 minutes (11 minutes), achieved Max Heart Rate 162 = 92% Max Predicted Heart Rate, clinically and electrocardiographically negative.  Nonspecific ST and T wave  changes.  Prestress echo showed EF 55 to 60% with normal wall motion.  Post-Stress: EF 65 to 70% with nl augmentation of all walls.=> NORMAL STRESS [facility]   Social History   Socioeconomic History   Marital status: Significant Other    Spouse name: Not on file   Number of children: 1   Years of education: Not on file   Highest education level: Not on file  Occupational History   Occupation: Hospital pharmacist     Employer: [employer]  Tobacco Use   Smoking status: Never   Smokeless tobacco: Never  Vaping Use   Vaping Use: Never used  Substance and Sexual Activity   Alcohol use: Yes    Comment: occ, wine or beer   Drug use: Never   Sexual activity: Yes    Birth control/protection: None  Other Topics Concern   Not on file  Social History Narrative   She is currently  divorced mother of 67 (50 year old son).   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    She is now with her longtime high school significant other after completing the divorce from her first husband.     She and her fianc intend to elope probably in May or June of this year.      She is an avid exerciser.  Enjoys jogging and walking.  She does not list 2 miles with treadmill and walks about 45 minutes most days - either indoors on the treadmill or outside.         Social Determinants of Health   Financial Resource Strain: Not on file  Food Insecurity: Not on file  Transportation Needs: Not on file  Physical Activity: Not on file  Stress: Not on file  Social Connections: Not on file  Intimate Partner Violence: Not on file   Current Outpatient Medications on File Prior to Visit  Medication Sig Dispense Refill   ALPRAZolam (XANAX) 0.5 MG tablet Take 0.5 mg by mouth as needed for anxiety or sleep.     ALPRAZolam (XANAX) 0.5 MG tablet Take 1 tablet by mouth at bedtime as  needed for anxiety/sleep 30 tablet 0   atorvastatin (LIPITOR) 20 MG tablet TAKE 1 TABLET BY MOUTH DAILY 90 tablet 3   atorvastatin (LIPITOR) 20 MG tablet Take 1 tablet (20 mg total) by mouth every other day. 45 tablet 0   benzonatate (TESSALON) 100 MG capsule Take 1 capsule (100 mg total) by mouth 3 (three) times daily as needed. 30 capsule 0   cephALEXin (KEFLEX) 500 MG capsule Take 1 capsule (500 mg total) by mouth 2 (two) times daily. 14 capsule 0   Continuous Blood Gluc Receiver (DEXCOM G6 RECEIVER) DEVI Use to check blood sugar 4 times daily 1 each 0   Continuous Blood Gluc Sensor (DEXCOM G6 SENSOR) MISC USE WITH DEXCOM TO CHECK BLOOD SUGAR 4 TIMES DAILY 9 each 3   Continuous Blood Gluc Transmit (DEXCOM G6 TRANSMITTER) MISC USE WITH DEXCOM TO CHECK BLOOD SUGAR 4 TIMES DAILY 1 each 3   COVID-19 At Home Antigen Test (CARESTART COVID-19 HOME TEST) KIT Use as directed. 4 each 0   COVID-19 mRNA Vac-TriS, Pfizer, (PFIZER-BIONT COVID-19 VAC-TRIS) SUSP injection Inject into the muscle. 0.3 mL 0   diclofenac (VOLTAREN) 75 MG EC tablet Take 75 mg by mouth 2 (two) times daily.     diclofenac (VOLTAREN) 75 MG EC tablet TAKE 1 TABLET BY MOUTH 2 TIMES DAILY 180 tablet 1   escitalopram (LEXAPRO) 20 MG tablet Take 20 mg by mouth daily.     escitalopram (LEXAPRO) 20 MG tablet TAKE 1 TABLET BY MOUTH ONCE A DAY 90 tablet 1   escitalopram (LEXAPRO) 20 MG tablet TAKE 1 TABLET BY MOUTH ONCE DAILY 90 tablet 0   escitalopram (LEXAPRO) 20 MG tablet Take 1 tablet by mouth once a day 90 tablet 0   escitalopram (LEXAPRO) 20 MG tablet Take 1 tablet by mouth Once a day 90 tablet 0   fluocinonide gel (LIDEX) 8.41 % Apply 1 application topically as needed (cold sores).     fluocinonide gel (LIDEX) 0.05 % Apply to affected areas twice daily as needed for flares 30 g 1   Glucagon 3 MG/DOSE POWD PLACE 3 MG INTO THE NOSE ONCE AS NEEDED FOR UP TO 1 DOSE. 1 each  11   glucose blood test strip USE TO TEST BLOOD SUGAR 3-4 TIMES A  DAY 400 strip 1   insulin degludec (TRESIBA FLEXTOUCH) 100 UNIT/ML FlexTouch Pen INJECT UP TO 13 UNITS UNDER SKIN DAILY 9 mL 3   insulin degludec (TRESIBA) 100 UNIT/ML FlexTouch Pen Inject 12-13 Units into the skin daily.     Insulin Lispro-aabc (LYUMJEV KWIKPEN) 100 UNIT/ML SOPN Inject up to 8 units before each meal 3-4x a day 15 mL 11   Insulin Pen Needle 32G X 4 MM MISC Use 4 - 6 times daily as directed 300 each 3   levothyroxine (SYNTHROID) 100 MCG tablet TAKE 1 TABLET BY MOUTH ONCE A DAY BEFORE BREAKFAST 45 tablet 3   Multiple Vitamin (MULTIVITAMIN) capsule Take 1 capsule by mouth daily.     valACYclovir (VALTREX) 1000 MG tablet Take 1,000 mg by mouth as needed (fever blisters).     No current facility-administered medications on file prior to visit.   Allergies  Allergen Reactions   Nitrofurantoin Itching and Other (See Comments)   Family History  Problem Relation Age of Onset   Hyperlipidemia Mother    Hyperlipidemia Father    CAD Paternal Grandmother        Long-term smoker   Heart attack Paternal Grandmother 63       Cause of death   Stroke Paternal Grandmother    Esophageal cancer Maternal Grandfather        Long-term smoker    PE: BP 110/70 (BP Location: Right Arm, Patient Position: Sitting, Cuff Size: Normal)   Pulse 74   Ht $R'5\' 8"'Nv$  (1.727 m)   Wt 151 lb 3.2 oz (68.6 kg)   LMP 02/07/2016   SpO2 99%   BMI 22.99 kg/m  Wt Readings from Last 3 Encounters:  02/13/21 151 lb 3.2 oz (68.6 kg)  10/09/20 150 lb 9.6 oz (68.3 kg)  09/18/20 151 lb 3.2 oz (68.6 kg)     Constitutional: normal weight, in NAD Eyes: PERRLA, EOMI, no exophthalmos ENT: moist mucous membranes, no thyromegaly, no cervical lymphadenopathy Cardiovascular: RRR, No MRG Respiratory: CTA B Gastrointestinal: abdomen soft, NT, ND, BS+ Musculoskeletal: no deformities, strength intact in all 4 Skin: moist, warm, no rashes Neurological: no tremor with outstretched hands, DTR normal in all  4  ASSESSMENT: 1. DM1, fairly well controlled, without long-term complications, but with hyperglycemia -She has a history of CKD stage III, which resolved after stopping NSAIDs  2. Hypothyroidism  PLAN:  1. Patient with longstanding, fairly well-controlled type 1 diabetes, previously on an OmniPod insulin pump but now on a basal-bolus insulin regimen after she ran out of her OmniPod supplies.  Her HbA1c at last visit was still at goal, at 6.2%, even lower than before.  At that time, we discussed about improving diet and she felt that she had problems with this and with blood sugars when she was working late.  We discussed about running out of the dose of insulin before cereals and popcorn and we also switched from Humalog to Lyumjev to give her more flexibility in dosing since this is injected at the start of the meal, rather than 15 minutes before.  She appears to be doing excellent without insulin pump so we continued without it per her preference. CGM interpretation: -At today's visit, we reviewed her CGM downloads: It appears that 69.4% of values are in target range (goal >70%), while 26.4% are higher than 180 (goal <25%), and 4.2% are lower than 70 (goal <4%).  The  calculated average blood sugar is 147.  The projected HbA1c for the next 3 months ([facility]) is 6.8%. -Reviewing the CGM trends, it appears that her sugars remain very well controlled between approximately 5 AM and 4 PM.  However, they increase occasionally after dinner and more consistently above a snack at night.  Overall, her sugars at night improved since last visit but she still has a steep peak in blood sugars around 230 between 12:30 and 1:30 AM.  I suspect that this is due to not bolusing enough for a snack.  She is doing a good job changing her snacks or even eliminating them but we discussed that she sometimes need to give herself more insulin for them.  She is also doing a good job changing her meals to lower carb options but  occasionally still having higher carb meals.  We discussed about strengthening her insulin to carb ratio with these.  She agrees to try this. -Reviewing her CGM downloads, it appears that her lowest blood sugar was 39.  We discussed about checking with her glucometer to see if the CGM value corresponds to the fingerstick, but if it does, to correct it gradually.  Since this blood sugar appeared when she was very active moving, I advised her to try to use more relaxed insulin to carb ratio before meals if she plans to be active afterwards. - I suggested to: Patient Instructions  Please continue: - Tresiba 12-13 units at bedtime - Lyumjev 2-6 units 3 times a day before meals + correction  - 1:10 ICR, target 120, ISF 60-65  - 1:8-9 ICR with carbs  Try to use a lower Lyumjev dose when you plan to be active after a meal (e.g. ICR of 1:12)  Please continue Levothyroxine 100 mcg daily.  Take the thyroid hormone every day, with water, at least 30 minutes before breakfast, separated by at least 4 hours from: - acid reflux medications - calcium - iron - multivitamins  Please return in 4 months.  - we checked her HbA1c: 6.3% (slightly higher) - advised to check sugars at different times of the day - 4x a day, rotating check times - advised for yearly eye exams >> she is not UTD but has an appointment coming up. - return to clinic in 4 months  2. Hypothyroidism - latest thyroid labs reviewed with pt. >> normal: Lab Results  Component Value Date   TSH 0.73 10/09/2020  - she continues on LT4 100 mcg daily - pt feels good on this dose. - we discussed about taking the thyroid hormone every day, with water, >30 minutes before breakfast, separated by >4 hours from acid reflux medications, calcium, iron, multivitamins. Pt. is taking it correctly.  Philemon Kingdom, MD PhD [facility]

## 2021-02-16 ENCOUNTER — Other Ambulatory Visit: Payer: Self-pay | Admitting: Internal Medicine

## 2021-02-16 ENCOUNTER — Other Ambulatory Visit (HOSPITAL_COMMUNITY): Payer: Self-pay

## 2021-02-17 ENCOUNTER — Other Ambulatory Visit (HOSPITAL_COMMUNITY): Payer: Self-pay

## 2021-02-17 MED FILL — Levothyroxine Sodium Tab 100 MCG: ORAL | 90 days supply | Qty: 90 | Fill #0 | Status: AC

## 2021-02-19 ENCOUNTER — Other Ambulatory Visit (HOSPITAL_COMMUNITY): Payer: Self-pay

## 2021-02-20 NOTE — Progress Notes (Signed)
   Subjective: 55 y.o. female presenting to the office today as a new patient for evaluation of symptomatic skin lesions to the plantar aspect of the left foot that have been present for few years now.  Gradual onset.  She states that she gets the lesions treated but they continue to recur.  It is very painful when walking barefoot.  She presents for further treatment and evaluation   Past Medical History:  Diagnosis Date   Anxiety    Anxiety with depression    Controlled.   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   Hypothyroid    MVP (mitral valve prolapse) 2005   Renally diagnosed back in 2005 (during her pregnancy)_, most recently evaluated in 2012.  ([facility] - Cardiology, Dr. Delman Cheadle); 11/2010 TTE: Moderate MR, posteriorly directed (recommend TEE) => TEE (01/2011) - mild MVP (anterior leaflet) with Mild-Mod MR.;      Objective:  Physical Exam General: Alert and oriented x3 in no acute distress  Dermatology: Hyperkeratotic lesion(s) present on the plantar aspect of the left forefoot; multiple. Pain on palpation with a central nucleated core noted. Skin is warm, dry and supple bilateral lower extremities. Negative for open lesions or macerations.  Vascular: Palpable pedal pulses bilaterally. No edema or erythema noted. Capillary refill within normal limits.  Neurological: Epicritic and protective threshold grossly intact bilaterally.   Musculoskeletal Exam: Pain on palpation at the keratotic lesion(s) noted. Range of motion within normal limits bilateral. Muscle strength 5/5 in all groups bilateral.  Assessment: 1.  Porokeratosis left forefoot; multiple   Plan of Care:  1. Patient evaluated 2. Excisional debridement of keratoic  lesion(s) using a chisel blade was performed without incident.  3. Dressed area with light dressing. 4.  Recommend OTC corn and callus remover daily  5.  Revitaderm Urea 40% lotion dispensed 6.  Patient is to return to the clinic PRN.   [facility] pharmacist  Edrick Kins, DPM [facility]  Dr. Edrick Kins, [place]                                        [place],  [id number]                Office [phone]  Fax [phone]

## 2021-03-08 ENCOUNTER — Encounter: Payer: Self-pay | Admitting: Nurse Practitioner

## 2021-03-08 ENCOUNTER — Ambulatory Visit (INDEPENDENT_AMBULATORY_CARE_PROVIDER_SITE_OTHER): Payer: 59 | Admitting: Nurse Practitioner

## 2021-03-08 ENCOUNTER — Other Ambulatory Visit (HOSPITAL_COMMUNITY)
Admission: RE | Admit: 2021-03-08 | Discharge: 2021-03-08 | Disposition: A | Discharge status: Home / Self Care | Payer: 59 | Source: Ambulatory Visit | Attending: Nurse Practitioner | Admitting: Nurse Practitioner

## 2021-03-08 ENCOUNTER — Other Ambulatory Visit: Payer: Self-pay

## 2021-03-08 VITALS — BP 118/76 | Ht 68.0 in | Wt 148.0 lb

## 2021-03-08 DIAGNOSIS — Z780 Asymptomatic menopausal state: Secondary | ICD-10-CM | POA: Diagnosis not present

## 2021-03-08 DIAGNOSIS — Z01419 Encounter for gynecological examination (general) (routine) without abnormal findings: Secondary | ICD-10-CM

## 2021-03-08 DIAGNOSIS — M8589 Other specified disorders of bone density and structure, multiple sites: Secondary | ICD-10-CM | POA: Diagnosis not present

## 2021-03-08 NOTE — Progress Notes (Signed)
   Maria Evans [birth year] [id number]   History:  55 y.o. G1P0001 presents for annual exam without GYN complaints. 2003 LEEP, 02/2018 positive high risk HPV negative 16/18. Postmenopausal - no HRT, no bleeding. T1DM and hypothyroidism managed by endocrinology, MVP managed by cardiology, anxiety, HLD managed by PCP.   Gynecologic History Patient's last menstrual period was 02/07/2016.   Contraception: post menopausal status  Health maintenance Last Pap: 03/07/2020. Results were: Normal, 1-year repeat per guidelines Last mammogram: 07/27/2020. Results were: normal Last colonoscopy: 2-3 years ago per patient. Results were: normal Last Dexa: 02/04/2017. Results were: T-score -1.6 FRAX 4.1% / 0.4%  Past medical history, past surgical history, family history and social history were all reviewed and documented in the EPIC chart. 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   ROS:  A ROS was performed and pertinent positives and negatives are included.  Exam:  Vitals:   03/08/21 1053  BP: 118/76  Weight: 148 lb (67.1 kg)  Height: '5\' 8"'$  (1.727 m)    Body mass index is 22.5 kg/m.  General appearance:  Normal Thyroid:  Symmetrical, normal in size, without palpable masses or nodularity. Respiratory  Auscultation:  Clear without wheezing or rhonchi Cardiovascular  Auscultation:  Regular rate, without rubs, murmurs or gallops  Edema/varicosities:  Not grossly evident Abdominal  Soft,nontender, without masses, guarding or rebound.  Liver/spleen:  No organomegaly noted  Hernia:  Left femoral bulge with standing, non-tender  Skin  Inspection:  Grossly normal   Breasts: Examined lying and sitting.   Right: Without masses, retractions, discharge or axillary adenopathy.   Left: Without masses, retractions, discharge or axillary adenopathy. Gentitourinary   Inguinal/mons:  Normal without inguinal adenopathy  External genitalia:  Normal  BUS/Urethra/Skene's glands:  Normal  Vagina:   Normal  Cervix:  Normal  Uterus:  Normal in size, shape and contour.  Midline and mobile  Adnexa/parametria:     Rt: Without masses or tenderness.   Lt: Without masses or tenderness.  Anus and perineum: Normal  Digital rectal exam: Normal sphincter tone without palpated masses or tenderness  Patient informed chaperone available to be present for breast and pelvic exam. Patient has requested no chaperone to be present. Patient has been advised what will be completed during breast and pelvic exam.   Assessment/Plan:  55 y.o. G1P0001 for annual exam.   Well female exam with routine gynecological exam - Plan: Cytology - PAP( [facility]). Education provided on SBEs, importance of preventative screenings, current guidelines, high calcium diet, regular exercise, and multivitamin daily. Labs done elsewhere.  Postmenopausal - Plan: DG Bone Density. No HRT, no bleeding.   Osteopenia of multiple sites - Plan: DG Bone Density. T-score -1.6 without elevated FRAX in 2018. Will schedule bone density now. Recommend daily Vitamin D supplement and regular exercise. She consumes high calcium diet.   Screening for cervical cancer - 2003 LEEP, 2019 positive HR HPV. Pap with HR HPV today.   Screening for breast cancer - Normal mammogram history.  Continue annual screenings.  Normal breast exam today.  Screening for colon cancer - Colonoscopy 2-3 years ago. Will repeat at GI's recommended interval.   Follow up in 1 year for annual       Erma, 11:28 AM 03/08/2021

## 2021-03-12 ENCOUNTER — Other Ambulatory Visit (HOSPITAL_COMMUNITY): Payer: Self-pay

## 2021-03-12 MED FILL — Continuous Glucose System Transmitter: 90 days supply | Qty: 1 | Fill #1 | Status: AC

## 2021-03-13 LAB — CYTOLOGY - PAP
Comment: NEGATIVE
Diagnosis: NEGATIVE
High risk HPV: POSITIVE — AB

## 2021-03-15 ENCOUNTER — Other Ambulatory Visit (HOSPITAL_COMMUNITY): Payer: Self-pay

## 2021-03-28 ENCOUNTER — Other Ambulatory Visit (HOSPITAL_COMMUNITY): Payer: Self-pay

## 2021-03-29 ENCOUNTER — Other Ambulatory Visit (HOSPITAL_COMMUNITY): Payer: Self-pay

## 2021-03-29 MED ORDER — ATORVASTATIN CALCIUM 20 MG PO TABS
ORAL_TABLET | ORAL | 1 refills | Status: DC
  Filled 2021-03-29: qty 45, 90d supply, fill #0
  Filled 2021-06-21: qty 45, 90d supply, fill #1

## 2021-03-29 MED ORDER — ESCITALOPRAM OXALATE 20 MG PO TABS
20.0000 mg | ORAL_TABLET | Freq: Every day | ORAL | 1 refills | Status: DC
  Filled 2021-03-29: qty 90, 90d supply, fill #0
  Filled 2021-06-26: qty 90, 90d supply, fill #1

## 2021-04-06 ENCOUNTER — Ambulatory Visit: Payer: 59 | Attending: Internal Medicine

## 2021-04-06 ENCOUNTER — Other Ambulatory Visit (HOSPITAL_BASED_OUTPATIENT_CLINIC_OR_DEPARTMENT_OTHER): Payer: Self-pay

## 2021-04-06 ENCOUNTER — Other Ambulatory Visit: Payer: Self-pay

## 2021-04-06 ENCOUNTER — Ambulatory Visit: Payer: 59

## 2021-04-06 DIAGNOSIS — Z23 Encounter for immunization: Secondary | ICD-10-CM

## 2021-04-06 MED ORDER — PFIZER COVID-19 VAC BIVALENT 30 MCG/0.3ML IM SUSP
INTRAMUSCULAR | 0 refills | Status: DC
  Filled 2021-04-06: qty 0.3, 1d supply, fill #0

## 2021-04-06 NOTE — Progress Notes (Signed)
   Covid-19 Vaccination Clinic  Name:  Maria Evans    MRN: [id number] DOB: Aug 06, 1965  04/06/2021  Maria Evans was observed post Covid-19 immunization for 15 minutes without incident. She was provided with Vaccine Information Sheet and instruction to access the V-Safe system.   Maria Evans was instructed to call 911 with any severe reactions post vaccine: Difficulty breathing  Swelling of face and throat  A fast heartbeat  A bad rash all over body  Dizziness and weakness

## 2021-04-09 ENCOUNTER — Ambulatory Visit: Payer: 59

## 2021-04-24 DIAGNOSIS — Z0289 Encounter for other administrative examinations: Secondary | ICD-10-CM

## 2021-04-25 ENCOUNTER — Other Ambulatory Visit (HOSPITAL_COMMUNITY): Payer: Self-pay

## 2021-04-25 MED FILL — Continuous Glucose System Sensor: 60 days supply | Qty: 6 | Fill #2 | Status: AC

## 2021-04-26 ENCOUNTER — Telehealth: Payer: 59 | Admitting: Physician Assistant

## 2021-04-26 ENCOUNTER — Other Ambulatory Visit (HOSPITAL_COMMUNITY): Payer: Self-pay

## 2021-04-26 DIAGNOSIS — M62830 Muscle spasm of back: Secondary | ICD-10-CM

## 2021-04-26 MED ORDER — CYCLOBENZAPRINE HCL 10 MG PO TABS
5.0000 mg | ORAL_TABLET | Freq: Three times a day (TID) | ORAL | 0 refills | Status: DC | PRN
  Filled 2021-04-26: qty 30, 10d supply, fill #0

## 2021-04-26 NOTE — Progress Notes (Signed)
Virtual Visit Consent   Maria Evans, you are scheduled for a virtual visit with a [facility] provider today.     Just as with appointments in the office, your consent must be obtained to participate.  Your consent will be active for this visit and any virtual visit you may have with one of our providers in the next 365 days.     If you have a MyChart account, a copy of this consent can be sent to you electronically.  All virtual visits are billed to your insurance company just like a traditional visit in the office.    As this is a virtual visit, video technology does not allow for your provider to perform a traditional examination.  This may limit your provider's ability to fully assess your condition.  If your provider identifies any concerns that need to be evaluated in person or the need to arrange testing (such as labs, EKG, etc.), we will make arrangements to do so.     Although advances in technology are sophisticated, we cannot ensure that it will always work on either your end or our end.  If the connection with a video visit is poor, the visit may have to be switched to a telephone visit.  With either a video or telephone visit, we are not always able to ensure that we have a secure connection.     I need to obtain your verbal consent now.   Are you willing to proceed with your visit today?    Maria Evans has provided verbal consent on 04/26/2021 for a virtual visit (video or telephone).   [name], PA-C   Date: 04/26/2021 2:42 PM   Virtual Visit via Video Note   I, [name], connected with  Maria Evans  ([id number], 04-May-1966) on 04/26/21 at  2:30 PM EDT by a video-enabled telemedicine application and verified that I am speaking with the correct person using two identifiers.  Location: Patient: home Provider: Virtual Visit Location Provider: Home Office   I discussed the limitations of evaluation and management by telemedicine and the  availability of in person appointments. The patient expressed understanding and agreed to proceed.    History of Present Illness: Maria Evans is a 55 y.o. who identifies as a female who was assigned female at birth, and is being seen today for lower back pain and muscle spasms. Have taken diclofenac, tylenol and ibuprofen without much relief. Started yesterday flaring. Has chronic back pain with h/o surgery in 2012.   Problems:  Patient Active Problem List   Diagnosis Date Noted   Type 1 diabetes mellitus with hyperglycemia ([place]) 08/22/2020   History of mitral valve prolapse in adulthood 08/23/2010    Allergies:  Allergies  Allergen Reactions   Nitrofurantoin Itching and Other (See Comments)   Medications:  Current Outpatient Medications:    cyclobenzaprine (FLEXERIL) 10 MG tablet, Take 0.5-1 tablets (5-10 mg total) by mouth 3 (three) times daily as needed for muscle spasms., Disp: 30 tablet, Rfl: 0   ALPRAZolam (XANAX) 0.5 MG tablet, Take 0.5 mg by mouth as needed for anxiety or sleep., Disp: , Rfl:    atorvastatin (LIPITOR) 20 MG tablet, Take 1 tablet (20 mg total) by mouth every other day., Disp: 45 tablet, Rfl: 1   cetirizine (ZYRTEC) 10 MG tablet, Take 10 mg by mouth daily., Disp: , Rfl:    Continuous Blood Gluc Receiver ([place]) DEVI, Use to check blood sugar 4  times daily, Disp: 1 each, Rfl: 0   Continuous Blood Gluc Sensor (DEXCOM G6 SENSOR) MISC, USE WITH DEXCOM TO CHECK BLOOD SUGAR 4 TIMES DAILY, Disp: 9 each, Rfl: 3   Continuous Blood Gluc Transmit (DEXCOM G6 TRANSMITTER) MISC, USE WITH DEXCOM TO CHECK BLOOD SUGAR 4 TIMES DAILY, Disp: 1 each, Rfl: 3   COVID-19 mRNA bivalent vaccine, Pfizer, (PFIZER COVID-19 VAC BIVALENT) injection, Inject into the muscle., Disp: 0.3 mL, Rfl: 0   COVID-19 mRNA Vac-TriS, Pfizer, (PFIZER-BIONT COVID-19 VAC-TRIS) SUSP injection, Inject into the muscle., Disp: 0.3 mL, Rfl: 0   diclofenac (VOLTAREN) 75 MG EC tablet, TAKE 1 TABLET BY  MOUTH 2 TIMES DAILY, Disp: 180 tablet, Rfl: 1   escitalopram (LEXAPRO) 20 MG tablet, Take 20 mg by mouth daily., Disp: , Rfl:    escitalopram (LEXAPRO) 20 MG tablet, Take 1 tablet by mouth once a day, Disp: 90 tablet, Rfl: 1   fluocinonide gel (LIDEX) 0.05 %, Apply to affected areas twice daily as needed for flares, Disp: 30 g, Rfl: 1   glucose blood test strip, USE TO TEST BLOOD SUGAR 3-4 TIMES A DAY, Disp: 400 strip, Rfl: 1   insulin degludec (TRESIBA FLEXTOUCH) 100 UNIT/ML FlexTouch Pen, INJECT UP TO 13 UNITS UNDER SKIN DAILY, Disp: 9 mL, Rfl: 3   insulin degludec (TRESIBA) 100 UNIT/ML FlexTouch Pen, Inject 12-13 Units into the skin daily., Disp: , Rfl:    Insulin Lispro-aabc (LYUMJEV KWIKPEN) 100 UNIT/ML KwikPen, Inject up to 8 units before each meal 3-4x a day, Disp: 15 mL, Rfl: 11   Insulin Pen Needle 32G X 4 MM MISC, Use 4 - 6 times daily as directed, Disp: 300 each, Rfl: 3   levothyroxine (SYNTHROID) 100 MCG tablet, TAKE 1 TABLET BY MOUTH ONCE A DAY BEFORE BREAKFAST, Disp: 45 tablet, Rfl: 3   Multiple Vitamin (MULTIVITAMIN) capsule, Take 1 capsule by mouth daily., Disp: , Rfl:    valACYclovir (VALTREX) 1000 MG tablet, Take 1,000 mg by mouth as needed (fever blisters)., Disp: , Rfl:   Observations/Objective: Patient is well-developed, well-nourished in no acute distress.  Resting comfortably at home.  Head is normocephalic, atraumatic.  No labored breathing.  Speech is clear and coherent with logical content.  Patient is alert and oriented at baseline.    Assessment and Plan: 1. Spasm of muscle of lower back - cyclobenzaprine (FLEXERIL) 10 MG tablet; Take 0.5-1 tablets (5-10 mg total) by mouth 3 (three) times daily as needed for muscle spasms.  Dispense: 30 tablet; Refill: 0  - Flexeril added - Continue diclofenac and tylenol alternating for pain - Heat - Rest - Discussed epsom salt soaks only if someone around to help her out of tub - Light stretches and exercises once able -  Seek further evaluation if not improving; discussed steroids if this treatment fails but needs to monitor VERY closely as she has T1DM   Follow Up Instructions: I discussed the assessment and treatment plan with the patient. The patient was provided an opportunity to ask questions and all were answered. The patient agreed with the plan and demonstrated an understanding of the instructions.  A copy of instructions were sent to the patient via MyChart unless otherwise noted below.    The patient was advised to call back or seek an in-person evaluation if the symptoms worsen or if the condition fails to improve as anticipated.  Time:  I spent 14 minutes with the patient via telehealth technology discussing the above problems/concerns.    Clearnce Sorrel  Linley Moskal, PA-C

## 2021-04-26 NOTE — Patient Instructions (Signed)
Maria Evans, thank you for joining [name], PA-C for today's virtual visit.  While this provider is not your primary care provider (PCP), if your PCP is located in our provider database this encounter information will be shared with them immediately following your visit.  Consent: (Patient) Maria Evans provided verbal consent for this virtual visit at the beginning of the encounter.  Current Medications:  Current Outpatient Medications:    cyclobenzaprine (FLEXERIL) 10 MG tablet, Take 0.5-1 tablets (5-10 mg total) by mouth 3 (three) times daily as needed for muscle spasms., Disp: 30 tablet, Rfl: 0   ALPRAZolam (XANAX) 0.5 MG tablet, Take 0.5 mg by mouth as needed for anxiety or sleep., Disp: , Rfl:    atorvastatin (LIPITOR) 20 MG tablet, Take 1 tablet (20 mg total) by mouth every other day., Disp: 45 tablet, Rfl: 1   cetirizine (ZYRTEC) 10 MG tablet, Take 10 mg by mouth daily., Disp: , Rfl:    Continuous Blood Gluc Receiver (DEXCOM G6 RECEIVER) DEVI, Use to check blood sugar 4 times daily, Disp: 1 each, Rfl: 0   Continuous Blood Gluc Sensor (DEXCOM G6 SENSOR) MISC, USE WITH DEXCOM TO CHECK BLOOD SUGAR 4 TIMES DAILY, Disp: 9 each, Rfl: 3   Continuous Blood Gluc Transmit (DEXCOM G6 TRANSMITTER) MISC, USE WITH DEXCOM TO CHECK BLOOD SUGAR 4 TIMES DAILY, Disp: 1 each, Rfl: 3   COVID-19 mRNA bivalent vaccine, Pfizer, (PFIZER COVID-19 VAC BIVALENT) injection, Inject into the muscle., Disp: 0.3 mL, Rfl: 0   COVID-19 mRNA Vac-TriS, Pfizer, (PFIZER-BIONT COVID-19 VAC-TRIS) SUSP injection, Inject into the muscle., Disp: 0.3 mL, Rfl: 0   diclofenac (VOLTAREN) 75 MG EC tablet, TAKE 1 TABLET BY MOUTH 2 TIMES DAILY, Disp: 180 tablet, Rfl: 1   escitalopram (LEXAPRO) 20 MG tablet, Take 20 mg by mouth daily., Disp: , Rfl:    escitalopram (LEXAPRO) 20 MG tablet, Take 1 tablet by mouth once a day, Disp: 90 tablet, Rfl: 1   fluocinonide gel (LIDEX) 0.05 %, Apply to affected areas twice daily as  needed for flares, Disp: 30 g, Rfl: 1   glucose blood test strip, USE TO TEST BLOOD SUGAR 3-4 TIMES A DAY, Disp: 400 strip, Rfl: 1   insulin degludec (TRESIBA FLEXTOUCH) 100 UNIT/ML FlexTouch Pen, INJECT UP TO 13 UNITS UNDER SKIN DAILY, Disp: 9 mL, Rfl: 3   insulin degludec (TRESIBA) 100 UNIT/ML FlexTouch Pen, Inject 12-13 Units into the skin daily., Disp: , Rfl:    Insulin Lispro-aabc (LYUMJEV KWIKPEN) 100 UNIT/ML KwikPen, Inject up to 8 units before each meal 3-4x a day, Disp: 15 mL, Rfl: 11   Insulin Pen Needle 32G X 4 MM MISC, Use 4 - 6 times daily as directed, Disp: 300 each, Rfl: 3   levothyroxine (SYNTHROID) 100 MCG tablet, TAKE 1 TABLET BY MOUTH ONCE A DAY BEFORE BREAKFAST, Disp: 45 tablet, Rfl: 3   Multiple Vitamin (MULTIVITAMIN) capsule, Take 1 capsule by mouth daily., Disp: , Rfl:    valACYclovir (VALTREX) 1000 MG tablet, Take 1,000 mg by mouth as needed (fever blisters)., Disp: , Rfl:    Medications ordered in this encounter:  Meds ordered this encounter  Medications   cyclobenzaprine (FLEXERIL) 10 MG tablet    Sig: Take 0.5-1 tablets (5-10 mg total) by mouth 3 (three) times daily as needed for muscle spasms.    Dispense:  30 tablet    Refill:  0    Order Specific Question:   Supervising Provider    Answer:  [place], [place]     *If you need refills on other medications prior to your next appointment, please contact your pharmacy*  Follow-Up: Call back or seek an in-person evaluation if the symptoms worsen or if the condition fails to improve as anticipated.  Other Instructions Back Exercises These exercises help to make your trunk and back strong. They also help to keep the lower back flexible. Doing these exercises can help to prevent or lessen pain in your lower back. If you have back pain, try to do these exercises 2-3 times each day or as told by your doctor. As you get better, do the exercises once each day. Repeat the exercises more often as told by your  doctor. To stop back pain from coming back, do the exercises once each day, or as told by your doctor. Do exercises exactly as told by your doctor. Stop right away if you feel sudden pain or your pain gets worse. Exercises Single knee to chest Do these steps 3-5 times in a row for each leg: Lie on your back on a firm bed or the floor with your legs stretched out. Bring one knee to your chest. Grab your knee or thigh with both hands and hold it in place. Pull on your knee until you feel a gentle stretch in your lower back or butt. Keep doing the stretch for 10-30 seconds. Slowly let go of your leg and straighten it. Pelvic tilt Do these steps 5-10 times in a row: Lie on your back on a firm bed or the floor with your legs stretched out. Bend your knees so they point up to the ceiling. Your feet should be flat on the floor. Tighten your lower belly (abdomen) muscles to press your lower back against the floor. This will make your tailbone point up to the ceiling instead of pointing down to your feet or the floor. Stay in this position for 5-10 seconds while you gently tighten your muscles and breathe evenly. Cat-cow Do these steps until your lower back bends more easily: Get on your hands and knees on a firm bed or the floor. Keep your hands under your shoulders, and keep your knees under your hips. You may put padding under your knees. Let your head hang down toward your chest. Tighten (contract) the muscles in your belly. Point your tailbone toward the floor so your lower back becomes rounded like the back of a cat. Stay in this position for 5 seconds. Slowly lift your head. Let the muscles of your belly relax. Point your tailbone up toward the ceiling so your back forms a sagging arch like the back of a cow. Stay in this position for 5 seconds.  Press-ups Do these steps 5-10 times in a row: Lie on your belly (face-down) on a firm bed or the floor. Place your hands near your head, about  shoulder-width apart. While you keep your back relaxed and keep your hips on the floor, slowly straighten your arms to raise the top half of your body and lift your shoulders. Do not use your back muscles. You may change where you place your hands to make yourself more comfortable. Stay in this position for 5 seconds. Keep your back relaxed. Slowly return to lying flat on the floor.  Bridges Do these steps 10 times in a row: Lie on your back on a firm bed or the floor. Bend your knees so they point up to the ceiling. Your feet should be flat on the  floor. Your arms should be flat at your sides, next to your body. Tighten your butt muscles and lift your butt off the floor until your waist is almost as high as your knees. If you do not feel the muscles working in your butt and the back of your thighs, slide your feet 1-2 inches (2.5-5 cm) farther away from your butt. Stay in this position for 3-5 seconds. Slowly lower your butt to the floor, and let your butt muscles relax. If this exercise is too easy, try doing it with your arms crossed over your chest. Belly crunches Do these steps 5-10 times in a row: Lie on your back on a firm bed or the floor with your legs stretched out. Bend your knees so they point up to the ceiling. Your feet should be flat on the floor. Cross your arms over your chest. Tip your chin a little bit toward your chest, but do not bend your neck. Tighten your belly muscles and slowly raise your chest just enough to lift your shoulder blades a tiny bit off the floor. Avoid raising your body higher than that because it can put too much stress on your lower back. Slowly lower your chest and your head to the floor. Back lifts Do these steps 5-10 times in a row: Lie on your belly (face-down) with your arms at your sides, and rest your forehead on the floor. Tighten the muscles in your legs and your butt. Slowly lift your chest off the floor while you keep your hips on the  floor. Keep the back of your head in line with the curve in your back. Look at the floor while you do this. Stay in this position for 3-5 seconds. Slowly lower your chest and your face to the floor. Contact a doctor if: Your back pain gets a lot worse when you do an exercise. Your back pain does not get better within 2 hours after you exercise. If you have any of these problems, stop doing the exercises. Do not do them again unless your doctor says it is okay. Get help right away if: You have sudden, very bad back pain. If this happens, stop doing the exercises. Do not do them again unless your doctor says it is okay. This information is not intended to replace advice given to you by your health care provider. Make sure you discuss any questions you have with your health care provider. Document Revised: 08/23/2020 Document Reviewed: 08/23/2020 Elsevier Patient Education  2022 [employer].    If you have been instructed to have an in-person evaluation today at a local Urgent Care facility, please use the link below. It will take you to a list of all of our available [facility], including address, phone number and hours of operation. Please do not delay care.  [facility]  If you or a family member do not have a primary care provider, use the link below to schedule a visit and establish care. When you choose a [facility] primary care physician or advanced practice provider, you gain a long-term partner in health. Find a Primary Care Provider  Learn more about [facility]'s in-office and virtual care options: [facility]

## 2021-05-15 ENCOUNTER — Ambulatory Visit (INDEPENDENT_AMBULATORY_CARE_PROVIDER_SITE_OTHER): Payer: 59

## 2021-05-15 ENCOUNTER — Other Ambulatory Visit: Payer: Self-pay

## 2021-05-15 DIAGNOSIS — Z780 Asymptomatic menopausal state: Secondary | ICD-10-CM | POA: Diagnosis not present

## 2021-05-15 DIAGNOSIS — M8589 Other specified disorders of bone density and structure, multiple sites: Secondary | ICD-10-CM

## 2021-05-28 ENCOUNTER — Other Ambulatory Visit (HOSPITAL_COMMUNITY): Payer: Self-pay

## 2021-05-28 MED FILL — Levothyroxine Sodium Tab 100 MCG: ORAL | 90 days supply | Qty: 90 | Fill #1 | Status: AC

## 2021-06-08 ENCOUNTER — Encounter: Payer: Self-pay | Admitting: Internal Medicine

## 2021-06-08 ENCOUNTER — Other Ambulatory Visit: Payer: Self-pay

## 2021-06-08 ENCOUNTER — Ambulatory Visit: Payer: 59 | Admitting: Internal Medicine

## 2021-06-08 ENCOUNTER — Other Ambulatory Visit (HOSPITAL_COMMUNITY): Payer: Self-pay

## 2021-06-08 VITALS — BP 100/70 | HR 72 | Ht 68.0 in | Wt 150.4 lb

## 2021-06-08 DIAGNOSIS — E039 Hypothyroidism, unspecified: Secondary | ICD-10-CM | POA: Diagnosis not present

## 2021-06-08 DIAGNOSIS — E1065 Type 1 diabetes mellitus with hyperglycemia: Secondary | ICD-10-CM

## 2021-06-08 LAB — POCT GLYCOSYLATED HEMOGLOBIN (HGB A1C): Hemoglobin A1C: 6.9 % — AB (ref 4.0–5.6)

## 2021-06-08 NOTE — Progress Notes (Signed)
Patient ID: Maria Evans, female   DOB: Feb 28, 1966, 55 y.o.   MRN: [id number]   This visit occurred during the SARS-CoV-2 public health emergency.  Safety protocols were in place, including screening questions prior to the visit, additional usage of staff PPE, and extensive cleaning of exam room while observing appropriate contact time as indicated for disinfecting solutions.   HPI: Maria Evans is a 55 y.o.-year-old very pleasant female, initially referred by her previous endocrinologist, Dr. Garvin Fila. [address] ([place], [place] - moved here end of 2020), returning for follow-up for DM1, diagnosed at 55 y/o, fairly well controlled, without long term complications and also hypothyroidism. Last visit 4 months ago.  Interim history: No increased urination, blurry vision, nausea, chest pain. She is still trying to adapt to married life.  She and her son moved to live with her new husband.  DM1:  Reviewed HbA1c levels: Lab Results  Component Value Date   HGBA1C 6.3 (A) 02/13/2021   HGBA1C 6.2 (A) 10/09/2020   HGBA1C 6.3 (A) 06/09/2020   HGBA1C 6.9 (A) 03/09/2020  02/08/2019: HbA1c 7.5%  Previously on Omni pod 2017-fall 2020 but stopped after running out of supplies and had problems with the insertion sites.  She is on: - Tresiba 12-13 >> 12 units at bedtime - Lyumjev 2-6 units 3 times a day before meals + correction  - 1:10 ICR, target 120, ISF 60-65  - 1:8-9 ICR with carbs >> not using Try to use a lower Lyumjev dose when you plan to be active after a meal (e.g. ICR of 1:12) She tried inhaled insulin in the past but she had a persistently severe cough and had to stop. She tried Glimepiride - not very effective.  She was on the freestyle libre CGM but this was not covered by insurance so she is now on a Dexcom CGM. She checks her sugars more than 4 times a day with her CGM:   Previously   Lowest sugar was 20-30s before after starting insulin ...>> 50s >> 39 >>  50s; she has hypoglycemia awareness in the 53s. I sent a prescription for a glucagon kit at last visit. No previous hypoglycemia admission. She may drop her sugars after exercise -usually exercises earlier in the day. Highest sugar was >600 (pump failed) ....>> 401 >> HI. No previous DKA admissions.  Pt's meals are: - Breakfast: Fruit, peanut butter and banana sandwich, oatmeal with fruit - Lunch: [place] and cheese sandwich + pickle - Dinner: Home-cooked family meals - Snacks: 1 to 2 almonds, cheese, fruit, veggies  -+ History of stage III CKD, resolved after stopping NSAIDs; last BUN/creatinine:  Lab Results  Component Value Date   BUN 10 06/09/2020   Lab Results  Component Value Date   CREATININE 0.75 06/09/2020   -+ HL; last set of lipids: Lab Results  Component Value Date   CHOL 162 06/09/2020   HDL 73.50 06/09/2020   LDLCALC 79 06/09/2020   TRIG 44.0 06/09/2020   CHOLHDL 2 06/09/2020  On Lipitor 20 - moved to every other day.  - last eye exam was in 12/2020: No DR - reportedly.  -No numbness and tingling in her feet.  No FH of DM.  Hypothyroidism:  Pt is on levothyroxine 100 mcg daily (dose decreased 07/2020), taken: - in am << moved from evening - fasting - at least 30 min from b'fast - no calcium - no iron - + multivitamins later in the day - no PPIs - not on  Biotin  Reviewed TSH levels: Lab Results  Component Value Date   TSH 0.73 10/09/2020   TSH 0.27 (L) 07/28/2020   TSH 0.36 06/09/2020  02/09/2019: TSH 0.7474   Pt denies: - feeling nodules in neck - hoarseness - dysphagia - choking - SOB with lying down  She also has a history of anxiety/depression, migraines. She has teeth grinding and jaw clenching. Wears a retainer.  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  She works either early in the day or a later shift from 3 PM to 10 PM.  ROS: + See HPI  I reviewed pt's medications, allergies, PMH, social hx, family hx, and changes were documented in  the history of present illness. Otherwise, unchanged from my initial visit note.  Past Medical History:  Diagnosis Date   Anxiety    Anxiety with depression    Controlled.   Common migraine without aura    Diabetes mellitus type I, controlled ([place])    Treated with 4 times daily insulin based on blood sugar monitoring   Hyperlipidemia due to type 1 diabetes mellitus ([place])    Currently on atorvastatin.   Hypothyroid    Lipoma    MVP (mitral valve prolapse) 2005   Renally diagnosed back in 2005 (during her pregnancy)_, most recently evaluated in 2012.  ([facility], Dr. Delman Cheadle); 11/2010 TTE: Moderate MR, posteriorly directed (recommend TEE) => TEE (01/2011) - mild MVP (anterior leaflet) with Mild-Mod MR.;    Past Surgical History:  Procedure Laterality Date   LAPAROSCOPIC LYSIS OF ADHESIONS     LIPOMA EXCISION Left    LUMBAR MICRODISCECTOMY     TRANSESOPHAGEAL ECHOCARDIOGRAM  02/13/2011   [facility] -> Dr. Delman Cheadle [facility]): EF 55 to 60%.  Borderline LA dilation.  Prolapse of A2 scallop of mitral valve-mild MVP with mild to moderate MR.  (2 eccentric jets, posteriorly directed, c/w anterior leaflet pathology))   TRANSTHORACIC ECHOCARDIOGRAM  12/11/2010   [facility] -> Dr. Delman Cheadle [facility]): EF 35 to 6%.  Mild LA dilation.  Moderate MR-eccentric/possibly directed jet consistent with antilipid pathology.  Mild TR, LP HTN, mild PR.  Recommend TEE.   TREADMILL STRESS ECHOCARDIOGRAM     [facility] -> Dr. Delman Cheadle [facility]): Exercised 10 minutes (11 minutes), achieved Max Heart Rate 162 = 92% Max Predicted Heart Rate, clinically and electrocardiographically negative.  Nonspecific ST and T wave changes.  Prestress echo showed EF 55 to 60% with normal wall motion.  Post-Stress: EF 65 to 70% with nl augmentation of all walls.=> NORMAL  STRESS [facility]   Social History   Socioeconomic History   Marital status: Married    Spouse name: Not on file   Number of children: 1   Years of education: Not on file   Highest education level: Not on file  Occupational History   Occupation: Hospital pharmacist     Employer: [employer]  Tobacco Use   Smoking status: Never   Smokeless tobacco: Never  Vaping Use   Vaping Use: Never used  Substance and Sexual Activity   Alcohol use: Yes    Comment: occ, wine or beer   Drug use: Never   Sexual activity: Yes  Other Topics Concern   Not on file  Social History Narrative   She is currently divorced mother of 36 (56 year old son).   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    She is now with her longtime high school significant other after completing the divorce from her first husband.     She and her fianc intend to elope probably in May or June of this year.      She is an avid exerciser.  Enjoys jogging and walking.  She does not list 2 miles with treadmill and walks about 45 minutes most days - either indoors on the treadmill or outside.         Social Determinants of Health   Financial Resource Strain: Not on file  Food Insecurity: Not on file  Transportation Needs: Not on file  Physical Activity: Not on file  Stress: Not on file  Social Connections: Not on file  Intimate Partner Violence: Not on file   Current Outpatient Medications on File Prior to Visit  Medication Sig Dispense Refill   ALPRAZolam (XANAX) 0.5 MG tablet Take 0.5 mg by mouth as needed for anxiety or sleep.     atorvastatin (LIPITOR) 20 MG tablet Take 1 tablet (20 mg total) by mouth every other day. 45 tablet 1   cetirizine (ZYRTEC) 10 MG tablet Take 10 mg by mouth daily.     Continuous Blood Gluc Receiver (DEXCOM G6 RECEIVER) DEVI Use to check blood sugar 4  times daily 1 each 0   Continuous Blood Gluc Sensor (DEXCOM G6 SENSOR) MISC USE WITH DEXCOM TO CHECK BLOOD SUGAR 4 TIMES DAILY 9 each 3   Continuous Blood Gluc Transmit (DEXCOM G6 TRANSMITTER) MISC USE WITH DEXCOM TO CHECK BLOOD SUGAR 4 TIMES DAILY 1 each 3   COVID-19 mRNA bivalent vaccine, Pfizer, (PFIZER COVID-19 VAC BIVALENT) injection Inject into the muscle. 0.3 mL 0   COVID-19 mRNA Vac-TriS, Pfizer, (PFIZER-BIONT COVID-19 VAC-TRIS) SUSP injection Inject into the muscle. 0.3 mL 0   cyclobenzaprine (FLEXERIL) 10 MG tablet Take 0.5-1 tablets (5-10 mg total) by mouth 3 (three) times daily as needed for muscle spasms. 30 tablet 0   diclofenac (VOLTAREN) 75 MG EC tablet TAKE 1 TABLET BY MOUTH 2 TIMES DAILY 180 tablet 1   escitalopram (LEXAPRO) 20 MG tablet Take 20 mg by mouth daily.     escitalopram (LEXAPRO) 20 MG tablet Take 1 tablet by mouth once a day 90 tablet 1   fluocinonide gel (LIDEX) 0.05 % Apply to affected areas twice daily as needed for flares 30 g 1   glucose blood test strip USE TO TEST BLOOD SUGAR 3-4 TIMES A DAY 400 strip 1   insulin degludec (TRESIBA FLEXTOUCH) 100 UNIT/ML FlexTouch Pen INJECT UP TO 13 UNITS UNDER SKIN DAILY 9 mL 3   insulin degludec (TRESIBA) 100 UNIT/ML FlexTouch Pen Inject 12-13 Units into the skin daily.     Insulin Lispro-aabc (LYUMJEV KWIKPEN) 100 UNIT/ML KwikPen Inject up to 8 units before each meal 3-4x a day 15 mL 11   Insulin Pen Needle 32G X 4 MM MISC Use 4 - 6 times daily as directed 300 each 3   levothyroxine (SYNTHROID) 100 MCG tablet TAKE 1 TABLET BY MOUTH ONCE A DAY BEFORE BREAKFAST 45 tablet 3   Multiple Vitamin (MULTIVITAMIN) capsule Take 1 capsule by mouth daily.     valACYclovir (VALTREX) 1000 MG tablet Take 1,000 mg by mouth as needed (fever blisters).     No current facility-administered medications on file prior to visit.   Allergies  Allergen Reactions   Nitrofurantoin Itching and Other (  See Comments)   Family History  Problem  Relation Age of Onset   Hyperlipidemia Mother    Hyperlipidemia Father    Heart Problems Father    Esophageal cancer Maternal Grandfather        Long-term smoker   CAD Paternal Grandmother        Long-term smoker   Heart attack Paternal Grandmother 35       Cause of death   Stroke Paternal Grandmother     PE: BP 100/70 (BP Location: Right Arm, Patient Position: Sitting, Cuff Size: Normal)    Pulse 72    Ht $R'5\' 8"'Ye$  (1.727 m)    Wt 150 lb 6.4 oz (68.2 kg)    LMP 02/07/2016    SpO2 99%    BMI 22.87 kg/m  Wt Readings from Last 3 Encounters:  06/08/21 150 lb 6.4 oz (68.2 kg)  03/08/21 148 lb (67.1 kg)  02/13/21 151 lb 3.2 oz (68.6 kg)     Constitutional: normal weight, in NAD Eyes: PERRLA, EOMI, no exophthalmos ENT: moist mucous membranes, no thyromegaly, no cervical lymphadenopathy Cardiovascular: RRR, No MRG Respiratory: CTA B Musculoskeletal: no deformities, strength intact in all 4 Skin: moist, warm, no rashes Neurological: no tremor with outstretched hands, DTR normal in all 4  ASSESSMENT: 1. DM1, fairly well controlled, without long-term complications, but with hyperglycemia -She has a history of CKD stage III, which resolved after stopping NSAIDs  2. Hypothyroidism  PLAN:  1. Patient with longstanding, fairly well-controlled type 1 diabetes, previously on an OmniPod insulin pump, but now on a basal-bolus insulin regimen after she ran out of her OmniPod supplies.  At last visit, HbA1c was very well controlled, at 6.3%, so we did not go back to the insulin pump.  Before last visit she did switch from Humalog to Lyumjev and she was doing very well on this. -At that time, sugars were very well controlled between approximately 5 AM and 4 PM, but they were increasing occasionally after dinner and more consistently after the snack at night.  Overall, sugars at night were better compared to before but there was a steep increase around 230 around midnight.  We discussed about bolusing a  little bit more with a snack.  Since she was also dropping her sugars after activity, I advised her to try to use a more relaxed insulin to carb ratio before meals if she plans to be active afterwards. CGM interpretation: -At today's visit, we reviewed her CGM downloads: It appears that 53% of values are in target range (goal >70%), while 41% are higher than 180 (goal <25%), and 6% are lower than 70 (goal <4%).  The calculated average blood sugar is 171.   -Reviewing the CGM trends, it appears that her sugars are quite high during the night, with a wide distribution.  They decrease around 7 AM and increase again after lunch.  The sugars increase again abruptly after dinner, after which they drop only to increase and stay high the entire night.  We reviewed her insulin injection protocols and eating habits.  It appears that she corrects low blood sugars with too many carbs.  Also, she is checking her blood sugars with her Dexcom after a correction of a low, rather than checking with her glucometer.  We discussed about the time lag between blood and interstitial fluid changes.  I explained that she needs to check capillary blood sugars to see if her sugars are improving, rather than relying on the sensor.  I  believe that this may be the reason why her sugars remain high the entire night. -Also, upon questioning, she is bolusing Lyumjev at the end of the meal or even after the meal.  I explained that this is not conducive to good control, it will allow her blood sugars to greatly increase after the meal and then drop abruptly.  I again strongly advised her to move Lyumjev at the start of the meal or slightly before the meal. -Upon questioning, she is also not using the stricter insulin to carb ratio that I recommended with starches.  Like that she was dropping her sugars occasionally after meals so she was afraid to use it and also because it is more difficult to calculate compared to the 1: 10 insulin to carb  ratio ICR.  Now that she would move the boluses before the meal, I advised her to again try the 1: 8 ICR with carbohydrate rich meals. -For now, I feel that her Tyler Aas dose is correct so we will not change this. - I suggested to: Patient Instructions  Please continue: - Tresiba 12 units at bedtime - Lyumjev 2-6 units 3 times a day before meals + correction  - 1:10 ICR, target 120, ISF 60-65  - add: 1:8 ICR with carbs/starches Try to use a lower Lyumjev dose when you plan to be active after a meal (e.g. ICR of 1:12). Do not overcorrect lows. Recheck sugars with the glucometer when correcting a low.  Please continue Levothyroxine 100 mcg daily.  Take the thyroid hormone every day, with water, at least 30 minutes before breakfast, separated by at least 4 hours from: - acid reflux medications - calcium - iron - multivitamins  Please return in 4 months.  - we checked her HbA1c: 6.9% (higher) - advised to check sugars at different times of the day - 4x a day, rotating check times - advised for yearly eye exams >> she is UTD - return to clinic in 4 months  2. Hypothyroidism - latest thyroid labs reviewed with pt. >> normal: Lab Results  Component Value Date   TSH 0.73 10/09/2020  - she continues on LT4 100 mcg daily - pt feels good on this dose. - we discussed about taking the thyroid hormone every day, with water, >30 minutes before breakfast, separated by >4 hours from acid reflux medications, calcium, iron, multivitamins. Pt. is taking it correctly. - will check thyroid tests at next visit  Total time spent for the visit: 40 min, in reviewing her pump downloads, discussing her hypo- and hyper-glycemic episodes, reviewing previous labs and pump settings and developing a plan to avoid hypo- and hyper-glycemia.    Philemon Kingdom, MD PhD [facility] Endocrinology

## 2021-06-08 NOTE — Patient Instructions (Addendum)
Please continue: - Tresiba 12 units at bedtime - Lyumjev 2-6 units 3 times a day before meals + correction  - 1:10 ICR, target 120, ISF 60-65  - add: 1:8 ICR with carbs/starches Try to use a lower Lyumjev dose when you plan to be active after a meal (e.g. ICR of 1:12). Do not overcorrect lows. Recheck sugars with the glucometer when correcting a low.  Please continue Levothyroxine 100 mcg daily.  Take the thyroid hormone every day, with water, at least 30 minutes before breakfast, separated by at least 4 hours from: - acid reflux medications - calcium - iron - multivitamins  Please return in 4 months.

## 2021-06-14 ENCOUNTER — Other Ambulatory Visit (HOSPITAL_COMMUNITY): Payer: Self-pay

## 2021-06-19 ENCOUNTER — Other Ambulatory Visit (HOSPITAL_COMMUNITY): Payer: Self-pay

## 2021-06-20 ENCOUNTER — Other Ambulatory Visit (HOSPITAL_COMMUNITY): Payer: Self-pay

## 2021-06-21 ENCOUNTER — Other Ambulatory Visit (HOSPITAL_COMMUNITY): Payer: Self-pay

## 2021-06-27 ENCOUNTER — Other Ambulatory Visit (HOSPITAL_COMMUNITY): Payer: Self-pay

## 2021-06-29 ENCOUNTER — Other Ambulatory Visit: Payer: Self-pay | Admitting: Internal Medicine

## 2021-06-29 ENCOUNTER — Telehealth: Payer: Self-pay

## 2021-06-29 ENCOUNTER — Other Ambulatory Visit (HOSPITAL_COMMUNITY): Payer: Self-pay

## 2021-06-29 MED ORDER — DEXCOM G6 TRANSMITTER MISC
3 refills | Status: AC
  Filled 2021-06-29: qty 1, 90d supply, fill #0
  Filled 2021-10-15: qty 1, 90d supply, fill #1
  Filled 2022-01-21: qty 1, 90d supply, fill #2
  Filled 2022-04-19: qty 1, 90d supply, fill #3

## 2021-06-29 MED ORDER — DEXCOM G6 SENSOR MISC
3 refills | Status: DC
  Filled 2021-06-29: qty 3, 30d supply, fill #0
  Filled 2021-08-06: qty 3, 30d supply, fill #1
  Filled 2021-08-31: qty 3, 30d supply, fill #2
  Filled 2021-10-03: qty 3, 30d supply, fill #3
  Filled 2021-11-01: qty 3, 30d supply, fill #4
  Filled 2021-12-07: qty 3, 30d supply, fill #5
  Filled 2022-01-01: qty 3, 30d supply, fill #6
  Filled 2022-02-04: qty 3, 30d supply, fill #7
  Filled 2022-03-07: qty 3, 30d supply, fill #8
  Filled 2022-04-11: qty 3, 30d supply, fill #9
  Filled 2022-05-13: qty 3, 30d supply, fill #10
  Filled 2022-06-06: qty 3, 30d supply, fill #11

## 2021-06-29 NOTE — Telephone Encounter (Addendum)
Patient Advocate Encounter   Received notification from [facility] that prior authorization for [facility] G6 Sensors & transmitter is required by his/her insurance [employer].   PA submitted on 06/28/21  Key#: [id number] & [id number]  Status is pending    [facility] will continue to follow:  Patient Advocate Fax: [phone]

## 2021-07-02 ENCOUNTER — Other Ambulatory Visit (HOSPITAL_COMMUNITY): Payer: Self-pay

## 2021-07-02 ENCOUNTER — Encounter: Payer: Self-pay | Admitting: Internal Medicine

## 2021-07-03 ENCOUNTER — Other Ambulatory Visit (HOSPITAL_COMMUNITY): Payer: Self-pay

## 2021-07-03 NOTE — Telephone Encounter (Signed)
Patient Advocate Encounter  Prior Authorization for [employer] has been approved.    PA# [id number]  Effective dates: 07/03/21 through 07/02/22  Per Test Claim Patients co-pay is $44.38.   Spoke with Pharmacy to Process.  Patient Advocate Fax: [phone]

## 2021-07-03 NOTE — Telephone Encounter (Addendum)
Patient Advocate Encounter  Prior Authorization for [employer] has been approved.    PA# [id number]  Effective dates: 06/29/21 through 06/28/22  Per Test Claim Patients co-pay is $66.18.   Spoke with Pharmacy to Process.  Patient Advocate Fax: [phone]

## 2021-07-25 ENCOUNTER — Other Ambulatory Visit (HOSPITAL_COMMUNITY): Payer: Self-pay

## 2021-07-28 ENCOUNTER — Other Ambulatory Visit: Payer: Self-pay | Admitting: Internal Medicine

## 2021-07-30 ENCOUNTER — Other Ambulatory Visit (HOSPITAL_COMMUNITY): Payer: Self-pay

## 2021-07-30 MED ORDER — UNIFINE PENTIPS 32G X 4 MM MISC
3 refills | Status: DC
  Filled 2021-07-30: qty 300, 50d supply, fill #0
  Filled 2021-10-17: qty 300, 50d supply, fill #1
  Filled 2021-12-07: qty 300, 50d supply, fill #2
  Filled 2022-01-21: qty 300, 50d supply, fill #3

## 2021-08-02 ENCOUNTER — Other Ambulatory Visit (HOSPITAL_COMMUNITY): Payer: Self-pay

## 2021-08-03 ENCOUNTER — Other Ambulatory Visit (HOSPITAL_COMMUNITY): Payer: Self-pay

## 2021-08-03 DIAGNOSIS — F419 Anxiety disorder, unspecified: Secondary | ICD-10-CM | POA: Diagnosis not present

## 2021-08-03 DIAGNOSIS — Z23 Encounter for immunization: Secondary | ICD-10-CM | POA: Diagnosis not present

## 2021-08-03 DIAGNOSIS — E785 Hyperlipidemia, unspecified: Secondary | ICD-10-CM | POA: Diagnosis not present

## 2021-08-03 DIAGNOSIS — Z79899 Other long term (current) drug therapy: Secondary | ICD-10-CM | POA: Diagnosis not present

## 2021-08-03 DIAGNOSIS — M545 Low back pain, unspecified: Secondary | ICD-10-CM | POA: Diagnosis not present

## 2021-08-03 DIAGNOSIS — M8589 Other specified disorders of bone density and structure, multiple sites: Secondary | ICD-10-CM | POA: Diagnosis not present

## 2021-08-03 DIAGNOSIS — E139 Other specified diabetes mellitus without complications: Secondary | ICD-10-CM | POA: Diagnosis not present

## 2021-08-03 MED ORDER — TRAMADOL HCL 50 MG PO TABS
ORAL_TABLET | ORAL | 0 refills | Status: DC
  Filled 2021-08-03: qty 15, 15d supply, fill #0

## 2021-08-03 MED ORDER — ALPRAZOLAM 0.5 MG PO TABS
ORAL_TABLET | ORAL | 0 refills | Status: DC
  Filled 2021-08-03: qty 10, 10d supply, fill #0

## 2021-08-03 MED ORDER — LISINOPRIL 2.5 MG PO TABS
ORAL_TABLET | ORAL | 1 refills | Status: DC
  Filled 2021-08-03: qty 90, 90d supply, fill #0
  Filled 2021-11-09: qty 90, 90d supply, fill #1

## 2021-08-06 ENCOUNTER — Other Ambulatory Visit (HOSPITAL_COMMUNITY): Payer: Self-pay

## 2021-08-16 ENCOUNTER — Other Ambulatory Visit (HOSPITAL_COMMUNITY): Payer: Self-pay

## 2021-08-16 ENCOUNTER — Other Ambulatory Visit: Payer: Self-pay | Admitting: Internal Medicine

## 2021-08-16 MED ORDER — TRESIBA FLEXTOUCH 100 UNIT/ML ~~LOC~~ SOPN
PEN_INJECTOR | SUBCUTANEOUS | 3 refills | Status: DC
  Filled 2021-08-16: qty 9, 69d supply, fill #0
  Filled 2021-11-09: qty 9, 69d supply, fill #1
  Filled 2022-01-01: qty 9, 69d supply, fill #2
  Filled 2022-03-22: qty 9, 69d supply, fill #3

## 2021-08-27 ENCOUNTER — Encounter: Payer: Self-pay | Admitting: Nurse Practitioner

## 2021-08-27 ENCOUNTER — Other Ambulatory Visit: Payer: Self-pay | Admitting: Nurse Practitioner

## 2021-08-27 DIAGNOSIS — Z1231 Encounter for screening mammogram for malignant neoplasm of breast: Secondary | ICD-10-CM

## 2021-08-29 ENCOUNTER — Ambulatory Visit
Admission: RE | Admit: 2021-08-29 | Discharge: 2021-08-29 | Disposition: A | Discharge status: Home / Self Care | Payer: 59 | Source: Ambulatory Visit | Attending: Nurse Practitioner | Admitting: Nurse Practitioner

## 2021-08-29 DIAGNOSIS — Z1231 Encounter for screening mammogram for malignant neoplasm of breast: Secondary | ICD-10-CM | POA: Diagnosis not present

## 2021-08-31 ENCOUNTER — Other Ambulatory Visit (HOSPITAL_COMMUNITY): Payer: Self-pay

## 2021-09-06 ENCOUNTER — Other Ambulatory Visit: Payer: Self-pay | Admitting: Internal Medicine

## 2021-09-06 ENCOUNTER — Other Ambulatory Visit (HOSPITAL_COMMUNITY): Payer: Self-pay

## 2021-09-06 MED ORDER — LEVOTHYROXINE SODIUM 100 MCG PO TABS
ORAL_TABLET | ORAL | 3 refills | Status: DC
Start: 2021-09-06 — End: 2021-10-12
  Filled 2021-09-06: qty 45, 45d supply, fill #0

## 2021-10-03 ENCOUNTER — Other Ambulatory Visit (HOSPITAL_COMMUNITY): Payer: Self-pay

## 2021-10-04 ENCOUNTER — Other Ambulatory Visit (HOSPITAL_COMMUNITY): Payer: Self-pay

## 2021-10-04 DIAGNOSIS — L821 Other seborrheic keratosis: Secondary | ICD-10-CM | POA: Diagnosis not present

## 2021-10-04 DIAGNOSIS — D229 Melanocytic nevi, unspecified: Secondary | ICD-10-CM | POA: Diagnosis not present

## 2021-10-04 DIAGNOSIS — L814 Other melanin hyperpigmentation: Secondary | ICD-10-CM | POA: Diagnosis not present

## 2021-10-04 DIAGNOSIS — L719 Rosacea, unspecified: Secondary | ICD-10-CM | POA: Diagnosis not present

## 2021-10-04 DIAGNOSIS — D1801 Hemangioma of skin and subcutaneous tissue: Secondary | ICD-10-CM | POA: Diagnosis not present

## 2021-10-04 DIAGNOSIS — L578 Other skin changes due to chronic exposure to nonionizing radiation: Secondary | ICD-10-CM | POA: Diagnosis not present

## 2021-10-04 MED ORDER — ESCITALOPRAM OXALATE 20 MG PO TABS
ORAL_TABLET | ORAL | 0 refills | Status: DC
  Filled 2021-10-04: qty 90, 90d supply, fill #0

## 2021-10-11 ENCOUNTER — Encounter: Payer: Self-pay | Admitting: Internal Medicine

## 2021-10-11 ENCOUNTER — Other Ambulatory Visit (HOSPITAL_COMMUNITY): Payer: Self-pay

## 2021-10-11 ENCOUNTER — Ambulatory Visit: Payer: 59 | Admitting: Internal Medicine

## 2021-10-11 ENCOUNTER — Other Ambulatory Visit: Payer: Self-pay | Admitting: Internal Medicine

## 2021-10-11 VITALS — BP 110/72 | HR 82 | Ht 68.0 in | Wt 151.4 lb

## 2021-10-11 DIAGNOSIS — E039 Hypothyroidism, unspecified: Secondary | ICD-10-CM | POA: Diagnosis not present

## 2021-10-11 DIAGNOSIS — E1065 Type 1 diabetes mellitus with hyperglycemia: Secondary | ICD-10-CM | POA: Diagnosis not present

## 2021-10-11 LAB — POCT GLYCOSYLATED HEMOGLOBIN (HGB A1C): Hemoglobin A1C: 6.4 % — AB (ref 4.0–5.6)

## 2021-10-11 MED ORDER — GLUCAGON 3 MG/DOSE NA POWD
3.0000 mg | Freq: Once | NASAL | 11 refills | Status: DC | PRN
  Filled 2021-10-11: qty 1, 1d supply, fill #0
  Filled 2021-10-17: qty 1, 28d supply, fill #0

## 2021-10-11 NOTE — Progress Notes (Signed)
Patient ID: Maria Evans, female   DOB: [birth year], 56 y.o.   MRN: [id number]  ? ?This visit occurred during the SARS-CoV-2 public health emergency.  Safety protocols were in place, including screening questions prior to the visit, additional usage of staff PPE, and extensive cleaning of exam room while observing appropriate contact time as indicated for disinfecting solutions.  ? ?HPI: ?Maria Evans is a 56 y.o.-year-old very pleasant female, initially referred by her previous endocrinologist, Dr. Garvin Fila. [address] ([place], [place] - moved here end of 2020), returning for follow-up for DM1, diagnosed at 56 y/o, fairly well controlled, without long term complications and also hypothyroidism. Last visit 4 months ago. ? ?Interim history: ?No increased urination, blurry vision, nausea, chest pain. ?Since last OV, she has been doing better with her diet and injecting insulin before meals and sugars improved. ?She did have an episode of a low blood sugar (41) while out at a concert as she forgot to take her sensor with her. ? ?DM1: ? ?Reviewed HbA1c levels: ?Lab Results  ?Component Value Date  ? HGBA1C 6.9 (A) 06/08/2021  ? HGBA1C 6.3 (A) 02/13/2021  ? HGBA1C 6.2 (A) 10/09/2020  ? HGBA1C 6.3 (A) 06/09/2020  ? HGBA1C 6.9 (A) 03/09/2020  ?02/08/2019: HbA1c 7.5% ? ?Previously on Omni pod 2017-fall 2020 but stopped after running out of supplies and had problems with the insertion sites. ? ?She is on: ?- Tresiba 12 units at bedtime ?- Lyumjev 2-6 units 3 times a day before meals + correction  ?- 1:10 ICR, target 120, ISF 60-65  ?- add: 1:8 ICR with carbs/starches ?Try to use a lower Lyumjev dose when you plan to be active after a meal (e.g. ICR of 1:12). ?Do not overcorrect lows. ?Recheck sugars with the glucometer when correcting a low. ?She tried inhaled insulin in the past but she had a persistently severe cough and had to stop. ?She tried Glimepiride - not very effective. ? ?She was on the freestyle  libre CGM but this was not covered by insurance so she is now on a Dexcom CGM. ?She checks her sugars more than 4 times a day with her CGM: ? ? ? ? ?Previously: ? ? ?Previously ? ? ?Lowest sugar was 20-30s before after starting insulin ...>> 50s >> 39 >> 50s >> 41; she has hypoglycemia awareness in the 45s. ?I sent a prescription for a glucagon kit at last visit. No previous hypoglycemia admission. She may drop her sugars after exercise -usually exercises earlier in the day. ?Highest sugar was >600 (pump failed) ....>> 401 >> HI. No previous DKA admissions. ? ?Pt's meals are: ?- Breakfast: Fruit, peanut butter and banana sandwich, oatmeal with fruit ?- Lunch: [place] and cheese sandwich + pickle ?- Dinner: Home-cooked family meals ?- Snacks: 1 to 2 almonds, cheese, fruit, veggies ? ?-+ History of stage III CKD, resolved after stopping NSAIDs; last BUN/creatinine:  ?07/27/2021: Glu 115, BUN, Cr 14/0.9, GFR 76 ?Lab Results  ?Component Value Date  ? BUN 10 06/09/2020  ? ?Lab Results  ?Component Value Date  ? CREATININE 0.75 06/09/2020  ?Started Lisinopril 2.5 mg daily- 07/2021. ? ?-+ HL; last set of lipids: ?07/27/2021: 183/107/83/81 ?Lab Results  ?Component Value Date  ? CHOL 162 06/09/2020  ? HDL 73.50 06/09/2020  ? [place] 79 06/09/2020  ? TRIG 44.0 06/09/2020  ? CHOLHDL 2 06/09/2020  ?On Lipitor 20 - moved to every other day. ? ?- last eye exam was in 12/2020: No DR - reportedly. ? ?-  No numbness and tingling in her feet. ? ?No FH of DM. ? ?Hypothyroidism: ? ?Pt is on levothyroxine 100 mcg daily (dose decreased 07/2020), taken: ?- in am << moved from evening ?- fasting ?- at least 30 min from b'fast ?- no calcium ?- no iron ?- + multivitamins later in the day ?- no PPIs ?- not on Biotin ? ?Reviewed TSH levels: ?Lab Results  ?Component Value Date  ? TSH 0.73 10/09/2020  ? TSH 0.27 (L) 07/28/2020  ? TSH 0.36 06/09/2020  ?02/09/2019: TSH 0.7474  ? ?Pt denies: ?- feeling nodules in neck ?- hoarseness ?- dysphagia ?-  choking ?- SOB with lying down ? ?She also has a history of anxiety/depression, migraines. ?She has teeth grinding and jaw clenching. Wears a retainer. ? 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  She works either early in the day or a later shift from 3 PM to 10 PM. ? ?ROS: ?+ See HPI ? ?I reviewed pt's medications, allergies, PMH, social hx, family hx, and changes were documented in the history of present illness. Otherwise, unchanged from my initial visit note. ? ?Past Medical History:  ?Diagnosis Date  ? Anxiety   ? Anxiety with depression   ? Controlled.  ? Common migraine without aura   ? Diabetes mellitus type I, controlled ([place])   ? Treated with 4 times daily insulin based on blood sugar monitoring  ? Hyperlipidemia due to type 1 diabetes mellitus ([place])   ? Currently on atorvastatin.  ? Hypothyroid   ? Lipoma   ? MVP (mitral valve prolapse) 2005  ? Renally diagnosed back in 2005 (during her pregnancy)_, most recently evaluated in 2012.  ([facility] - Cardiology, Dr. Delman Cheadle); 11/2010 TTE: Moderate MR, posteriorly directed (recommend TEE) => TEE (01/2011) - mild MVP (anterior leaflet) with Mild-Mod MR.;   ? ?Past Surgical History:  ?Procedure Laterality Date  ? LAPAROSCOPIC LYSIS OF ADHESIONS    ? LIPOMA EXCISION Left   ? LUMBAR MICRODISCECTOMY    ? TRANSESOPHAGEAL ECHOCARDIOGRAM  02/13/2011  ? [facility] -> Dr. Delman Cheadle [facility]): EF 55 to 60%.  Borderline LA dilation.  Prolapse of A2 scallop of mitral valve-mild MVP with mild to moderate MR.  (2 eccentric jets, posteriorly directed, c/w anterior leaflet pathology))  ? TRANSTHORACIC ECHOCARDIOGRAM  12/11/2010  ? [facility] -> Dr. Delman Cheadle [facility]): EF 35 to 6%.  Mild LA dilation.  Moderate MR-eccentric/possibly directed jet consistent with antilipid pathology.  Mild TR, LP HTN, mild PR.  Recommend TEE.  ? TREADMILL STRESS ECHOCARDIOGRAM    ? [facility] -> Dr. Delman Cheadle [facility]): Exercised 10 minutes (11 minutes), achieved Max Heart Rate 162 = 92% Max Predicted Heart Rate, clinically and electrocardiographically negative.  Nonspecific ST and T wave changes.  Prestress echo showed EF 55 to 60% with normal wall motion.  Post-Stress: EF 65 to 70% with nl augmentation of all walls.=> NORMAL STRESS [facility]  ? ?Social History  ? ?Socioeconomic History  ? Marital status: Married  ?  Spouse name: Not on file  ? Number of children: 1  ? Years of education: Not on file  ? Highest education level: Not on file  ?Occupational History  ? Occupation: Hospital pharmacist   ?  Employer: [employer]  ?Tobacco Use  ? Smoking status: Never  ? Smokeless tobacco: Never  ?Vaping Use  ? Vaping Use: Never used  ?Substance and Sexual Activity  ? Alcohol use: Yes  ?  Comment: occ, wine or beer  ? Drug use: Never  ? Sexual activity: Yes  ?Other Topics Concern  ? Not on file  ?Social History Narrative  ? She is currently divorced mother of 33 (42 year old son).  ? 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   ? She is now with her longtime high school significant other after completing the divorce from her first husband.    ? She and her fianc? intend to elope probably in May or June of this year.  ?   ? She is an avid exerciser.  Enjoys jogging and walking.  She does not list 2 miles with treadmill and walks about 45 minutes most days - either indoors on the treadmill or outside.  ?   ?   ? ?Social Determinants of Health  ? ?Financial Resource Strain: Not on file  ?Food Insecurity: Not on file  ?Transportation Needs: Not on file  ?Physical Activity: Not on file  ?Stress: Not on file  ?Social Connections: Not on file  ?Intimate Partner Violence: Not on file  ? ?Current Outpatient Medications on File Prior to Visit   ?Medication Sig Dispense Refill  ? ALPRAZolam (XANAX) 0.5 MG tablet Take 0.5 mg by mouth as needed for anxiety or sleep.    ? ALPRAZolam (XANAX) 0.5 MG tablet Take 1 tablet by mouth once a day as needed 10 table

## 2021-10-11 NOTE — Patient Instructions (Addendum)
Please decrease: ?- Tresiba 10 units at bedtime ? ?  Continue: ?- Lyumjev 2-6 units 3 times a day before meals + correction  ?- 1:10 ICR, target 120, ISF 60-65  ?(- 1:8 ICR with carbs/starches) ?Try to use a lower Lyumjev dose when you plan to be active after a meal (e.g. ICR of 1:12). ? ?Try to read "Think like a pancreas". ? ?Please continue Levothyroxine 100 mcg daily. ? ?Take the thyroid hormone every day, with water, at least 30 minutes before breakfast, separated by at least 4 hours from: ?- acid reflux medications ?- calcium ?- iron ?- multivitamins ? ?Please return in 4 months. ?

## 2021-10-12 ENCOUNTER — Other Ambulatory Visit (HOSPITAL_COMMUNITY): Payer: Self-pay

## 2021-10-12 ENCOUNTER — Other Ambulatory Visit: Payer: Self-pay | Admitting: Internal Medicine

## 2021-10-12 LAB — T4, FREE: Free T4: 1.04 ng/dL (ref 0.60–1.60)

## 2021-10-12 LAB — TSH: TSH: 1.89 u[IU]/mL (ref 0.35–5.50)

## 2021-10-12 MED ORDER — LEVOTHYROXINE SODIUM 100 MCG PO TABS
100.0000 ug | ORAL_TABLET | Freq: Every day | ORAL | 3 refills | Status: DC
  Filled 2021-10-12: qty 90, 90d supply, fill #0
  Filled 2022-01-01: qty 90, 90d supply, fill #1
  Filled 2022-04-19: qty 90, 90d supply, fill #2

## 2021-10-12 MED ORDER — GVOKE HYPOPEN 1-PACK 1 MG/0.2ML ~~LOC~~ SOAJ
0.2000 mL | SUBCUTANEOUS | 99 refills | Status: DC | PRN
  Filled 2021-10-12: qty 0.2, 30d supply, fill #0
  Filled 2021-10-17: qty 0.2, 14d supply, fill #0
  Filled 2022-06-06: qty 0.2, 14d supply, fill #1

## 2021-10-15 ENCOUNTER — Other Ambulatory Visit (HOSPITAL_COMMUNITY): Payer: Self-pay

## 2021-10-16 ENCOUNTER — Other Ambulatory Visit (HOSPITAL_COMMUNITY): Payer: Self-pay

## 2021-10-17 ENCOUNTER — Other Ambulatory Visit (HOSPITAL_COMMUNITY): Payer: Self-pay

## 2021-10-18 ENCOUNTER — Other Ambulatory Visit (HOSPITAL_COMMUNITY): Payer: Self-pay

## 2021-11-01 ENCOUNTER — Other Ambulatory Visit (HOSPITAL_COMMUNITY): Payer: Self-pay

## 2021-11-09 ENCOUNTER — Other Ambulatory Visit (HOSPITAL_COMMUNITY): Payer: Self-pay

## 2021-11-09 ENCOUNTER — Telehealth: Payer: 59 | Admitting: Emergency Medicine

## 2021-11-09 ENCOUNTER — Other Ambulatory Visit: Payer: Self-pay | Admitting: Internal Medicine

## 2021-11-09 DIAGNOSIS — L03213 Periorbital cellulitis: Secondary | ICD-10-CM

## 2021-11-09 DIAGNOSIS — E039 Hypothyroidism, unspecified: Secondary | ICD-10-CM | POA: Insufficient documentation

## 2021-11-09 MED ORDER — LYUMJEV KWIKPEN 100 UNIT/ML ~~LOC~~ SOPN
PEN_INJECTOR | SUBCUTANEOUS | 11 refills | Status: DC
Start: 2021-11-09 — End: 2022-11-25
  Filled 2021-11-09: qty 15, 46d supply, fill #0
  Filled 2022-01-01: qty 15, 46d supply, fill #1
  Filled 2022-03-22: qty 15, 46d supply, fill #2
  Filled 2022-05-22: qty 15, 46d supply, fill #3
  Filled 2022-08-02: qty 15, 46d supply, fill #4
  Filled 2022-09-27: qty 15, 46d supply, fill #5

## 2021-11-09 MED ORDER — CEPHALEXIN 500 MG PO CAPS
500.0000 mg | ORAL_CAPSULE | Freq: Four times a day (QID) | ORAL | 0 refills | Status: AC
  Filled 2021-11-09: qty 28, 7d supply, fill #0

## 2021-11-09 NOTE — Progress Notes (Signed)
Virtual Visit Consent   Maria Evans, you are scheduled for a virtual visit with a [facility] provider today. Just as with appointments in the office, your consent must be obtained to participate. Your consent will be active for this visit and any virtual visit you may have with one of our providers in the next 365 days. If you have a MyChart account, a copy of this consent can be sent to you electronically.  As this is a virtual visit, video technology does not allow for your provider to perform a traditional examination. This may limit your provider's ability to fully assess your condition. If your provider identifies any concerns that need to be evaluated in person or the need to arrange testing (such as labs, EKG, etc.), we will make arrangements to do so. Although advances in technology are sophisticated, we cannot ensure that it will always work on either your end or our end. If the connection with a video visit is poor, the visit may have to be switched to a telephone visit. With either a video or telephone visit, we are not always able to ensure that we have a secure connection.  By engaging in this virtual visit, you consent to the provision of healthcare and authorize for your insurance to be billed (if applicable) for the services provided during this visit. Depending on your insurance coverage, you may receive a charge related to this service.  I need to obtain your verbal consent now. Are you willing to proceed with your visit today? Maria Evans has provided verbal consent on 11/09/2021 for a virtual visit (video or telephone). Carvel Getting, NP  Date: 11/09/2021 8:44 AM  Virtual Visit via Video Note   I, Carvel Getting, connected with  Maria Evans  ([id number], 10-Aug-1965) on 11/09/21 at  8:30 AM EDT by a video-enabled telemedicine application and verified that I am speaking with the correct person using two identifiers.  Location: Patient: Virtual Visit Location Patient:  Other: work Provider: Ecologist: Home Office   I discussed the limitations of evaluation and management by telemedicine and the availability of in person appointments. The patient expressed understanding and agreed to proceed.    History of Present Illness: Maria Evans is a 56 y.o. who identifies as a female who was assigned female at birth, and is being seen today for facial pain and swelling.  It began yesterday lateral to her left eye.  It was just a little tender and sore.  She denies any injury or scratch or insect sting to that area.  She used warm compress on it last night.  She washed her face with gentle baby cleanser last night.  It was sore enough last night that she was not able to sleep in her usual position.  This morning she repeated the warm compresses and washing her face with gentle baby cleanser.  She feels like it is starting to get worse.  She feels like there is swelling in the periorbital area. Also her left eyelid is a little red but her eyelid is not swollen and not painful.  She denies any eye discharge or any eye redness or pain.  Her symptoms are in the skin on the lateral left periorbital area.  No reported fever or chills.  She does wear a contact in the right eye, but she does not wear any contacts in the left eye.  HPI: HPI  Problems:  Patient Active Problem List  Diagnosis Date Noted   Hypothyroidism 11/09/2021   Type 1 diabetes mellitus with hyperglycemia ([place]) 08/22/2020   History of mitral valve prolapse in adulthood 08/23/2010    Allergies:  Allergies  Allergen Reactions   Nitrofurantoin Itching and Other (See Comments)   Medications:  Current Outpatient Medications:    cephALEXin (KEFLEX) 500 MG capsule, Take 1 capsule (500 mg total) by mouth 4 (four) times daily for 7 days., Disp: 28 capsule, Rfl: 0   ALPRAZolam (XANAX) 0.5 MG tablet, Take 0.5 mg by mouth as needed for anxiety or sleep., Disp: , Rfl:    ALPRAZolam (XANAX) 0.5  MG tablet, Take 1 tablet by mouth once a day as needed, Disp: 10 tablet, Rfl: 0   atorvastatin (LIPITOR) 20 MG tablet, Take 1 tablet (20 mg total) by mouth every other day., Disp: 45 tablet, Rfl: 1   cetirizine (ZYRTEC) 10 MG tablet, Take 10 mg by mouth daily., Disp: , Rfl:    Continuous Blood Gluc Receiver ([place]) DEVI, Use to check blood sugar 4 times daily, Disp: 1 each, Rfl: 0   Continuous Blood Gluc Sensor (DEXCOM G6 SENSOR) MISC, USE WITH DEXCOM TO CHECK BLOOD SUGAR 4 TIMES DAILY, Disp: 9 each, Rfl: 3   Continuous Blood Gluc Transmit (DEXCOM G6 TRANSMITTER) MISC, USE WITH DEXCOM TO CHECK BLOOD SUGAR 4 TIMES DAILY, Disp: 1 each, Rfl: 3   COVID-19 mRNA bivalent vaccine, Pfizer, (PFIZER COVID-19 VAC BIVALENT) injection, Inject into the muscle., Disp: 0.3 mL, Rfl: 0   COVID-19 mRNA Vac-TriS, Pfizer, (PFIZER-BIONT COVID-19 VAC-TRIS) SUSP injection, Inject into the muscle., Disp: 0.3 mL, Rfl: 0   cyclobenzaprine (FLEXERIL) 10 MG tablet, Take 0.5-1 tablets (5-10 mg total) by mouth 3 (three) times daily as needed for muscle spasms., Disp: 30 tablet, Rfl: 0   escitalopram (LEXAPRO) 20 MG tablet, Take 20 mg by mouth daily., Disp: , Rfl:    escitalopram (LEXAPRO) 20 MG tablet, Take 1 tablet by mouth once a day, Disp: 90 tablet, Rfl: 1   escitalopram (LEXAPRO) 20 MG tablet, Take 1 tablet by mouth Once a day 90 days, Disp: 90 tablet, Rfl: 0   fluocinonide gel (LIDEX) 0.05 %, Apply to affected areas twice daily as needed for flares, Disp: 30 g, Rfl: 1   Glucagon 3 MG/DOSE POWD, Place 3 mg into the nose once as needed for up to 1 dose., Disp: 1 each, Rfl: 11   GVOKE HYPOPEN 1-PACK 1 MG/0.2ML SOAJ, Inject 0.2 mLs into the skin as needed., Disp: 0.2 mL, Rfl: PRN   insulin degludec (TRESIBA FLEXTOUCH) 100 UNIT/ML FlexTouch Pen, INJECT UP TO 13 UNITS UNDER SKIN DAILY, Disp: 9 mL, Rfl: 3   insulin degludec (TRESIBA) 100 UNIT/ML FlexTouch Pen, Inject 12-13 Units into the skin daily., Disp: , Rfl:     Insulin Lispro-aabc (LYUMJEV KWIKPEN) 100 UNIT/ML KwikPen, Inject up to 8 units before each meal 3-4 times a day (Patient taking differently: Inject up to 2-10 units before each meal 3-4 times a day), Disp: 15 mL, Rfl: 11   Insulin Pen Needle (UNIFINE PENTIPS) 32G X 4 MM MISC, Use 4 - 6 times daily as directed, Disp: 300 each, Rfl: 3   levothyroxine (SYNTHROID) 100 MCG tablet, Take 1 tablet (100 mcg total) by mouth daily before breakfast., Disp: 90 tablet, Rfl: 3   lisinopril (ZESTRIL) 2.5 MG tablet, Take 1 tablet by mouth once a day, Disp: 90 tablet, Rfl: 1   Multiple Vitamin (MULTIVITAMIN) capsule, Take 1 capsule by mouth daily., Disp: ,  Rfl:    traMADol (ULTRAM) 50 MG tablet, Take 1 tablet by mouth once daily as needed, Disp: 15 tablet, Rfl: 0   valACYclovir (VALTREX) 1000 MG tablet, Take 1,000 mg by mouth as needed (fever blisters)., Disp: , Rfl:   Observations/Objective: Patient is well-developed, well-nourished in no acute distress.  Resting comfortably  at work Head is normocephalic, atraumatic.  No labored breathing.  Speech is clear and coherent with logical content.  Patient is alert and oriented at baseline.  It is difficult to see on video, but her left cheek and left lateral periorbital area may be slightly swollen compared to the right.  She does have very slight erythema to the left eyelid.  Her conjunctive appear to be not injected and to be clear on video.  Assessment and Plan: 1. Periorbital cellulitis of left eye  I suspect an early cellulitis and given that it is in the periorbital area, I will go ahead and empirically treat with Keflex.  Patient is to continue using warm compresses and washing her face with gentle soap.  She is instructed to seek further evaluation if symptoms change or worsen or do not improve with this treatment plan  Follow Up Instructions: I discussed the assessment and treatment plan with the patient. The patient was provided an opportunity to ask  questions and all were answered. The patient agreed with the plan and demonstrated an understanding of the instructions.  A copy of instructions were sent to the patient via MyChart unless otherwise noted below.   The patient was advised to call back or seek an in-person evaluation if the symptoms worsen or if the condition fails to improve as anticipated.  Time:  I spent 10 minutes with the patient via telehealth technology discussing the above problems/concerns.    Carvel Getting, NP

## 2021-11-09 NOTE — Patient Instructions (Signed)
Maria Evans, thank you for joining Carvel Getting, NP for today's virtual visit.  While this provider is not your primary care provider (PCP), if your PCP is located in our provider database this encounter information will be shared with them immediately following your visit.  Consent: (Patient) Maria Evans provided verbal consent for this virtual visit at the beginning of the encounter.  Current Medications:  Current Outpatient Medications:    cephALEXin (KEFLEX) 500 MG capsule, Take 1 capsule (500 mg total) by mouth 4 (four) times daily for 7 days., Disp: 28 capsule, Rfl: 0   ALPRAZolam (XANAX) 0.5 MG tablet, Take 0.5 mg by mouth as needed for anxiety or sleep., Disp: , Rfl:    ALPRAZolam (XANAX) 0.5 MG tablet, Take 1 tablet by mouth once a day as needed, Disp: 10 tablet, Rfl: 0   atorvastatin (LIPITOR) 20 MG tablet, Take 1 tablet (20 mg total) by mouth every other day., Disp: 45 tablet, Rfl: 1   cetirizine (ZYRTEC) 10 MG tablet, Take 10 mg by mouth daily., Disp: , Rfl:    Continuous Blood Gluc Receiver ([place]) DEVI, Use to check blood sugar 4 times daily, Disp: 1 each, Rfl: 0   Continuous Blood Gluc Sensor (DEXCOM G6 SENSOR) MISC, USE WITH DEXCOM TO CHECK BLOOD SUGAR 4 TIMES DAILY, Disp: 9 each, Rfl: 3   Continuous Blood Gluc Transmit (DEXCOM G6 TRANSMITTER) MISC, USE WITH DEXCOM TO CHECK BLOOD SUGAR 4 TIMES DAILY, Disp: 1 each, Rfl: 3   COVID-19 mRNA bivalent vaccine, Pfizer, (PFIZER COVID-19 VAC BIVALENT) injection, Inject into the muscle., Disp: 0.3 mL, Rfl: 0   COVID-19 mRNA Vac-TriS, Pfizer, (PFIZER-BIONT COVID-19 VAC-TRIS) SUSP injection, Inject into the muscle., Disp: 0.3 mL, Rfl: 0   cyclobenzaprine (FLEXERIL) 10 MG tablet, Take 0.5-1 tablets (5-10 mg total) by mouth 3 (three) times daily as needed for muscle spasms., Disp: 30 tablet, Rfl: 0   escitalopram (LEXAPRO) 20 MG tablet, Take 20 mg by mouth daily., Disp: , Rfl:    escitalopram (LEXAPRO) 20 MG tablet,  Take 1 tablet by mouth once a day, Disp: 90 tablet, Rfl: 1   escitalopram (LEXAPRO) 20 MG tablet, Take 1 tablet by mouth Once a day 90 days, Disp: 90 tablet, Rfl: 0   fluocinonide gel (LIDEX) 0.05 %, Apply to affected areas twice daily as needed for flares, Disp: 30 g, Rfl: 1   Glucagon 3 MG/DOSE POWD, Place 3 mg into the nose once as needed for up to 1 dose., Disp: 1 each, Rfl: 11   GVOKE HYPOPEN 1-PACK 1 MG/0.2ML SOAJ, Inject 0.2 mLs into the skin as needed., Disp: 0.2 mL, Rfl: PRN   insulin degludec (TRESIBA FLEXTOUCH) 100 UNIT/ML FlexTouch Pen, INJECT UP TO 13 UNITS UNDER SKIN DAILY, Disp: 9 mL, Rfl: 3   insulin degludec (TRESIBA) 100 UNIT/ML FlexTouch Pen, Inject 12-13 Units into the skin daily., Disp: , Rfl:    Insulin Lispro-aabc (LYUMJEV KWIKPEN) 100 UNIT/ML KwikPen, Inject up to 8 units before each meal 3-4 times a day (Patient taking differently: Inject up to 2-10 units before each meal 3-4 times a day), Disp: 15 mL, Rfl: 11   Insulin Pen Needle (UNIFINE PENTIPS) 32G X 4 MM MISC, Use 4 - 6 times daily as directed, Disp: 300 each, Rfl: 3   levothyroxine (SYNTHROID) 100 MCG tablet, Take 1 tablet (100 mcg total) by mouth daily before breakfast., Disp: 90 tablet, Rfl: 3   lisinopril (ZESTRIL) 2.5 MG tablet, Take 1 tablet by mouth  once a day, Disp: 90 tablet, Rfl: 1   Multiple Vitamin (MULTIVITAMIN) capsule, Take 1 capsule by mouth daily., Disp: , Rfl:    traMADol (ULTRAM) 50 MG tablet, Take 1 tablet by mouth once daily as needed, Disp: 15 tablet, Rfl: 0   valACYclovir (VALTREX) 1000 MG tablet, Take 1,000 mg by mouth as needed (fever blisters)., Disp: , Rfl:    Medications ordered in this encounter:  Meds ordered this encounter  Medications   cephALEXin (KEFLEX) 500 MG capsule    Sig: Take 1 capsule (500 mg total) by mouth 4 (four) times daily for 7 days.    Dispense:  28 capsule    Refill:  0     *If you need refills on other medications prior to your next appointment, please contact  your pharmacy*  Follow-Up: Call back or seek an in-person evaluation if the symptoms worsen or if the condition fails to improve as anticipated.  Other Instructions Continue using warm compresses using a clean washcloth each time\  Pay close attention to your symptoms, and if you feel like they are getting worse or not improving with this treatment plan, please be seen again for further evaluation.   If you have been instructed to have an in-person evaluation today at a local Urgent Care facility, please use the link below. It will take you to a list of all of our available [facility], including address, phone number and hours of operation. Please do not delay care.  [facility]  If you or a family member do not have a primary care provider, use the link below to schedule a visit and establish care. When you choose a [facility] primary care physician or advanced practice provider, you gain a long-term partner in health. Find a Primary Care Provider  Learn more about [facility]'s in-office and virtual care options: [facility]

## 2021-11-12 ENCOUNTER — Other Ambulatory Visit (HOSPITAL_COMMUNITY): Payer: Self-pay

## 2021-11-14 ENCOUNTER — Other Ambulatory Visit (HOSPITAL_COMMUNITY): Payer: Self-pay

## 2021-12-07 ENCOUNTER — Other Ambulatory Visit (HOSPITAL_COMMUNITY): Payer: Self-pay

## 2022-01-01 ENCOUNTER — Other Ambulatory Visit (HOSPITAL_COMMUNITY): Payer: Self-pay

## 2022-01-01 MED ORDER — ESCITALOPRAM OXALATE 20 MG PO TABS
ORAL_TABLET | ORAL | 1 refills | Status: DC
  Filled 2022-01-01: qty 90, 90d supply, fill #0
  Filled 2022-04-19: qty 90, 90d supply, fill #1

## 2022-01-03 ENCOUNTER — Other Ambulatory Visit (HOSPITAL_COMMUNITY): Payer: Self-pay

## 2022-01-21 ENCOUNTER — Other Ambulatory Visit (HOSPITAL_COMMUNITY): Payer: Self-pay

## 2022-02-04 ENCOUNTER — Other Ambulatory Visit (HOSPITAL_COMMUNITY): Payer: Self-pay

## 2022-02-04 MED ORDER — OMRON 3 SERIES BP MONITOR DEVI
0 refills | Status: AC
Start: 2022-02-04 — End: ?
  Filled 2022-02-04: qty 1, 30d supply, fill #0

## 2022-02-06 DIAGNOSIS — F419 Anxiety disorder, unspecified: Secondary | ICD-10-CM | POA: Diagnosis not present

## 2022-02-07 ENCOUNTER — Other Ambulatory Visit (HOSPITAL_COMMUNITY): Payer: Self-pay

## 2022-02-07 MED ORDER — ALPRAZOLAM 0.5 MG PO TABS
ORAL_TABLET | ORAL | 0 refills | Status: DC
  Filled 2022-02-07: qty 10, 30d supply, fill #0

## 2022-02-11 ENCOUNTER — Ambulatory Visit (INDEPENDENT_AMBULATORY_CARE_PROVIDER_SITE_OTHER): Payer: 59 | Admitting: Internal Medicine

## 2022-02-11 ENCOUNTER — Encounter: Payer: Self-pay | Admitting: Internal Medicine

## 2022-02-11 VITALS — BP 118/78 | HR 70 | Ht 68.0 in | Wt 152.6 lb

## 2022-02-11 DIAGNOSIS — E1065 Type 1 diabetes mellitus with hyperglycemia: Secondary | ICD-10-CM

## 2022-02-11 DIAGNOSIS — E039 Hypothyroidism, unspecified: Secondary | ICD-10-CM | POA: Diagnosis not present

## 2022-02-11 LAB — POCT GLYCOSYLATED HEMOGLOBIN (HGB A1C): Hemoglobin A1C: 6.3 % — AB (ref 4.0–5.6)

## 2022-02-11 NOTE — Progress Notes (Addendum)
Patient ID: Maria Evans, female   DOB: 09-Jul-1965, 56 y.o.   MRN: [id number]   HPI: Maria Evans is a 56 y.o.-year-old very pleasant female, initially referred by her previous endocrinologist, Dr. Garvin Fila. [address] ([place], [place] - moved here end of 2020), returning for follow-up for DM1, diagnosed at 56 y/o, fairly well controlled, without long term complications and also hypothyroidism. Last visit 4 months ago.  Interim history: No increased urination, blurry vision, nausea, chest pain. She just moved her son to [employer] college last weekend.  This is a very stressful period for her.  DM1:  Reviewed HbA1c levels: Lab Results  Component Value Date   HGBA1C 6.4 (A) 10/11/2021   HGBA1C 6.9 (A) 06/08/2021   HGBA1C 6.3 (A) 02/13/2021   HGBA1C 6.2 (A) 10/09/2020   HGBA1C 6.3 (A) 06/09/2020   HGBA1C 6.9 (A) 03/09/2020  02/08/2019: HbA1c 7.5%  Previously on Omni pod 2017-fall 2020 but stopped after running out of supplies and had problems with the insertion sites.  She is on: - Tresiba 12 >> 10 >> 11 units at bedtime - Lyumjev 2-6 units 3 times a day before meals + correction >> ends up with ~ 4 units - 1:10 ICR, target 120, ISF 60-65  - 1:8 ICR with carbs/starches Try to use a lower Lyumjev dose when you plan to be active after a meal (e.g. ICR of 1:12). Recheck sugars with the glucometer when correcting a low. She tried inhaled insulin in the past but she had a persistently severe cough and had to stop. She tried Glimepiride - not very effective.  She was on the freestyle libre CGM but this was not covered by insurance so she is now on a Dexcom CGM. She checks her sugars more than 4 times a day with her CGM:    Previously:     Previously:   Lowest sugar was 20-30s before after starting insulin ... >> 50s >> 41 >> 40s; she has hypoglycemia awareness in the 54s. I sent a prescription for a glucagon kit at last visit. No previous hypoglycemia  admission. She may drop her sugars after exercise -usually exercises earlier in the day. Highest sugar was >600 (pump failed) ....>> 401 >> HI >> 300s. No previous DKA admissions.  Pt's meals are: - Breakfast: Fruit, peanut butter and banana sandwich, oatmeal with fruit - Lunch: [place] and cheese sandwich + pickle - Dinner: Home-cooked family meals - Snacks: 1 to 2 almonds, cheese, fruit, veggies  -+ History of stage III CKD, resolved after stopping NSAIDs; last BUN/creatinine:  07/27/2021: Glu 115, BUN, Cr 14/0.9, GFR 76 Lab Results  Component Value Date   BUN 10 06/09/2020   Lab Results  Component Value Date   CREATININE 0.75 06/09/2020  Started Lisinopril 2.5 mg daily- 07/2021.  -+ HL; last set of lipids: 07/27/2021: 183/107/83/81 Lab Results  Component Value Date   CHOL 162 06/09/2020   HDL 73.50 06/09/2020   LDLCALC 79 06/09/2020   TRIG 44.0 06/09/2020   CHOLHDL 2 06/09/2020  On Lipitor 20 - moved to every other day.  - last eye exam was in 12/2020: No DR - reportedly.  -No numbness and tingling in her feet.  No FH of DM.  Hypothyroidism:  Pt is on levothyroxine 100 mcg daily (dose decreased 07/2020), taken: - in am << moved from evening - fasting - at least 30 min from b'fast - no calcium - no iron - + multivitamins later in the day - no  PPIs - not on Biotin  Reviewed TSH levels: Lab Results  Component Value Date   TSH 1.89 10/11/2021   TSH 0.73 10/09/2020   TSH 0.27 (L) 07/28/2020   TSH 0.36 06/09/2020  02/09/2019: TSH 0.7474   Pt denies: - feeling nodules in neck - hoarseness - dysphagia - choking She also has a history of anxiety/depression, migraines. She has teeth grinding and jaw clenching. Wears a retainer.  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  She works either early in the day or a later shift from 3 PM to 10 PM.  ROS: + See HPI  I reviewed pt's medications, allergies, PMH, social hx, family hx, and changes were documented in the  history of present illness. Otherwise, unchanged from my initial visit note.  Past Medical History:  Diagnosis Date   Anxiety    Anxiety with depression    Controlled.   Common migraine without aura    Diabetes mellitus type I, controlled ([place])    Treated with 4 times daily insulin based on blood sugar monitoring   Hyperlipidemia due to type 1 diabetes mellitus ([place])    Currently on atorvastatin.   Hypothyroid    Lipoma    MVP (mitral valve prolapse) 2005   Renally diagnosed back in 2005 (during her pregnancy)_, most recently evaluated in 2012.  ([facility] - Cardiology, Dr. Delman Cheadle); 11/2010 TTE: Moderate MR, posteriorly directed (recommend TEE) => TEE (01/2011) - mild MVP (anterior leaflet) with Mild-Mod MR.;    Past Surgical History:  Procedure Laterality Date   LAPAROSCOPIC LYSIS OF ADHESIONS     LIPOMA EXCISION Left    LUMBAR MICRODISCECTOMY     TRANSESOPHAGEAL ECHOCARDIOGRAM  02/13/2011   [facility] -> Dr. Delman Cheadle [facility]): EF 55 to 60%.  Borderline LA dilation.  Prolapse of A2 scallop of mitral valve-mild MVP with mild to moderate MR.  (2 eccentric jets, posteriorly directed, c/w anterior leaflet pathology))   TRANSTHORACIC ECHOCARDIOGRAM  12/11/2010   [facility] -> Dr. Delman Cheadle [facility]): EF 35 to 6%.  Mild LA dilation.  Moderate MR-eccentric/possibly directed jet consistent with antilipid pathology.  Mild TR, LP HTN, mild PR.  Recommend TEE.   TREADMILL STRESS ECHOCARDIOGRAM     [facility] -> Dr. Delman Cheadle [facility]): Exercised 10 minutes (11 minutes), achieved Max Heart Rate 162 = 92% Max Predicted Heart Rate, clinically and electrocardiographically negative.  Nonspecific ST and T wave changes.  Prestress echo showed EF 55 to 60% with normal wall motion.  Post-Stress: EF 65 to 70% with nl augmentation of all walls.=> NORMAL STRESS  [facility]   Social History   Socioeconomic History   Marital status: Married    Spouse name: Not on file   Number of children: 1   Years of education: Not on file   Highest education level: Not on file  Occupational History   Occupation: Hospital pharmacist     Employer: [employer]  Tobacco Use   Smoking status: Never   Smokeless tobacco: Never  Vaping Use   Vaping Use: Never used  Substance and Sexual Activity   Alcohol use: Yes    Comment: occ, wine or beer   Drug use: Never   Sexual activity: Yes  Other Topics Concern   Not on file  Social History Narrative   She is currently divorced mother of 107 (28 year old son).   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    She is now with her longtime high school significant other after completing the divorce from her first husband.     She and her fianc intend to elope probably in May or June of this year.      She is an avid exerciser.  Enjoys jogging and walking.  She does not list 2 miles with treadmill and walks about 45 minutes most days - either indoors on the treadmill or outside.         Social Determinants of Health   Financial Resource Strain: Not on file  Food Insecurity: Not on file  Transportation Needs: Not on file  Physical Activity: Not on file  Stress: Not on file  Social Connections: Not on file  Intimate Partner Violence: Not on file   Current Outpatient Medications on File Prior to Visit  Medication Sig Dispense Refill   ALPRAZolam (XANAX) 0.5 MG tablet Take 0.5 mg by mouth as needed for anxiety or sleep.     ALPRAZolam (XANAX) 0.5 MG tablet Take 1 tablet by mouth once a day as needed 10 tablet 0   ALPRAZolam (XANAX) 0.5 MG tablet Take 1 tablet by mouth once daily as needed (30 day supply) 10 tablet 0   atorvastatin (LIPITOR) 20 MG tablet Take 1 tablet (20 mg  total) by mouth every other day. 45 tablet 1   Blood Pressure Monitoring (OMRON 3 SERIES BP MONITOR) DEVI Use as directed 1 each 0   cetirizine (ZYRTEC) 10 MG tablet Take 10 mg by mouth daily.     Continuous Blood Gluc Receiver (DEXCOM G6 RECEIVER) DEVI Use to check blood sugar 4 times daily 1 each 0   Continuous Blood Gluc Sensor (DEXCOM G6 SENSOR) MISC USE WITH DEXCOM TO CHECK BLOOD SUGAR 4 TIMES DAILY 9 each 3   Continuous Blood Gluc Transmit (DEXCOM G6 TRANSMITTER) MISC USE WITH DEXCOM TO CHECK BLOOD SUGAR 4 TIMES DAILY 1 each 3   COVID-19 mRNA bivalent vaccine, Pfizer, (PFIZER COVID-19 VAC BIVALENT) injection Inject into the muscle. 0.3 mL 0   COVID-19 mRNA Vac-TriS, Pfizer, (PFIZER-BIONT COVID-19 VAC-TRIS) SUSP injection Inject into the muscle. 0.3 mL 0   cyclobenzaprine (FLEXERIL) 10 MG tablet Take 0.5-1 tablets (5-10 mg total) by mouth 3 (three) times daily as needed for muscle spasms. 30 tablet 0   escitalopram (LEXAPRO) 20 MG tablet Take 20 mg by mouth daily.     escitalopram (LEXAPRO) 20 MG tablet Take 1 tablet by mouth once a day 90 tablet 1   escitalopram (LEXAPRO) 20 MG tablet Take 1 tablet by mouth daily 90 tablet 1   fluocinonide gel (LIDEX) 0.05 % Apply to affected areas twice daily as needed for flares 30 g 1   Glucagon 3 MG/DOSE POWD Place 3 mg into the nose once as needed for up to 1 dose. 1 each 11   GVOKE HYPOPEN 1-PACK 1 MG/0.2ML SOAJ Inject 0.2 mLs into the skin as needed. 0.2 mL PRN   insulin degludec (TRESIBA FLEXTOUCH) 100 UNIT/ML FlexTouch Pen INJECT UP TO 13 UNITS UNDER SKIN DAILY 9 mL 3   insulin degludec (TRESIBA) 100 UNIT/ML FlexTouch Pen Inject 12-13 Units into the skin daily.     Insulin Lispro-aabc (LYUMJEV KWIKPEN) 100 UNIT/ML KwikPen Inject up to 8 units before each meal 3-4 times a day 15 mL 11   Insulin Pen Needle (UNIFINE PENTIPS) 32G X 4 MM MISC  Use 4 - 6 times daily as directed 300 each 3   levothyroxine (SYNTHROID) 100 MCG tablet Take 1 tablet (100 mcg  total) by mouth daily before breakfast. 90 tablet 3   lisinopril (ZESTRIL) 2.5 MG tablet Take 1 tablet by mouth once a day 90 tablet 1   Multiple Vitamin (MULTIVITAMIN) capsule Take 1 capsule by mouth daily.     traMADol (ULTRAM) 50 MG tablet Take 1 tablet by mouth once daily as needed 15 tablet 0   valACYclovir (VALTREX) 1000 MG tablet Take 1,000 mg by mouth as needed (fever blisters).     No current facility-administered medications on file prior to visit.   Allergies  Allergen Reactions   Nitrofurantoin Itching and Other (See Comments)   Family History  Problem Relation Age of Onset   Hyperlipidemia Mother    Hyperlipidemia Father    Heart Problems Father    Esophageal cancer Maternal Grandfather        Long-term smoker   CAD Paternal Grandmother        Long-term smoker   Heart attack Paternal Grandmother 87       Cause of death   Stroke Paternal Grandmother     PE: BP 118/78 (BP Location: Right Arm, Patient Position: Sitting, Cuff Size: Normal)   Pulse 70   Ht $R'5\' 8"'oQ$  (1.727 m)   Wt 152 lb 9.6 oz (69.2 kg)   LMP 02/07/2016   SpO2 99%   BMI 23.20 kg/m  Wt Readings from Last 3 Encounters:  02/11/22 152 lb 9.6 oz (69.2 kg)  10/11/21 151 lb 6.4 oz (68.7 kg)  06/08/21 150 lb 6.4 oz (68.2 kg)     Constitutional: normal weight, in NAD Eyes:EOMI, no exophthalmos ENT: moist mucous membranes, no thyromegaly, no cervical lymphadenopathy Cardiovascular: RRR, No MRG Respiratory: CTA B Musculoskeletal: no deformities Skin: moist, warm, no rashes Neurological: no tremor with outstretched hands Diabetic Foot Exam - Simple   Simple Foot Form Diabetic Foot exam was performed with the following findings: Yes 02/11/2022  4:20 PM  Visual Inspection No deformities, no ulcerations, no other skin breakdown bilaterally: Yes Sensation Testing Intact to touch and monofilament testing bilaterally: Yes Pulse Check Posterior Tibialis and Dorsalis pulse intact bilaterally: Yes Comments      ASSESSMENT: 1. DM1, fairly well controlled, without long-term complications, but with hyperglycemia -She has a history of CKD stage III, which resolved after stopping NSAIDs  2. Hypothyroidism  PLAN:  1. Patient with longstanding, fairly well-controlled type 1 diabetes, previously on the OmniPod insulin pump, but now on the basal/bolus insulin regimen, after she ran out of her OmniPod supplies.  At last visit, HbA1c was excellent, decreased, at 6.4%.  At that time, sugars were fluctuating more in the normal range, with improved values after dinner.  She had some higher blood sugars in the second half of the night, from 1 AM to 8 AM, with a pattern consistent with counter regulatory hyperglycemia following hypoglycemic episodes.  I suspected that her Tyler Aas dose is too high and I advised her to decrease the dose by 2 units.  We did not change her Lyumjev doses.  She is taking 3 units per meal on average.  At last visit I sent a prescription for glucagon to her pharmacy. -We discussed at last visits  to check blood sugars by glucometer whenever correcting a low blood sugar, rather than relying on the sensor, due to the delay between blood sugars and interstitial fluid sugars. CGM interpretation: -At today's visit,  we reviewed her CGM downloads: It appears that 67% of values are in target range (goal >70%), while 26% are higher than 180 (goal <25%), and 7% are lower than 70 (goal <4%).  The calculated average blood sugar is 151.  The projected HbA1c for the next 3 months (GMI) is 6.9%. -Reviewing the CGM trends, and the 2 weeks, sugars have been increasing more overnight and she also continues to have blood sugars lower than 70 scattered throughout the day.  These usually occur after correction of high blood sugars throughout the day.  She is doing a better job taking her Lyumjev right before meals and she is using a 1:10 insulin to carb ratio before meals without too many carbs, and the stricter ICR,  1:8, for starchier meals.  The sugars can occasionally increase after these meals and she corrects with 1 to 2 units of insulin, which is too much for her.  I did advise her to only correct with 1 unit and if she still notices drops in blood sugars under 70, will need to switch to the Luxura pen with Lyumjev cartridge, since this is able to give her half units.  She will let me know.  Another problem is that she is over correcting hypoglycemia with 4-6candies, which also appears to be too much for her.  Many times, if her sugars are lower after dinner and she eats these, her sugars remain high throughout the night.  I advised her to not correct with more than 2 candies.  Otherwise, she is doing a good job changing the timing and the consistency of her dinner and observing trends in her blood sugars, so for now, I did not have other recommendations for her.  She did increase the dose of Tresiba since last visit that she felt that her sugars are higher afterwards and for now we can continue with this dose, since I feel that her lows are more related to overcorrection rather than the basal insulin. -We again discussed about the possibility of starting on an insulin pump which would allow her more efficient control, and she continues to think about it but would not want to start it quite yet. - I suggested to: Patient Instructions  Please continue: - Tresiba 11 units at bedtime - Lyumjev 2-6 units 3 times a day before meals + correction  - 1:10 ICR, target 120, ISF 60-65  (- 1:8 ICR with carbs/starches) Try to use a lower Lyumjev dose when you plan to be active after a meal (e.g. ICR of 1:12).  Do not correct hyperglycemia with more than 1 unit.  Do not correct hypoglycemia with more than 2 Ronalee Belts and Ike's.  Let me know if we need to switch to the Luxura pen (half units).  Please continue Levothyroxine 100 mcg daily.  Take the thyroid hormone every day, with water, at least 30 minutes before breakfast,  separated by at least 4 hours from: - acid reflux medications - calcium - iron - multivitamins  Please return in 4 months.  - we checked her HbA1c: 6.3% (lower) - advised to check sugars at different times of the day - 4x a day, rotating check times - advised for yearly eye exams >> she is UTD - foot exam performed today - return to clinic in 4 months  2. Hypothyroidism - latest thyroid labs reviewed with pt. >> normal: Lab Results  Component Value Date   TSH 1.89 10/11/2021  - she continues on LT4 100 mcg daily -  pt feels good on this dose. - we discussed about taking the thyroid hormone every day, with water, >30 minutes before breakfast, separated by >4 hours from acid reflux medications, calcium, iron, multivitamins. Pt. is taking it correctly.  Total time spent for the visit: 40 min, in precharting, reviewing her insulin doses, discussing her hypo- and hyper-glycemic episodes, reviewing correction techniques, reviewing previous labs and developing a plan to avoid hypo- and hyper-glycemia.    Philemon Kingdom, MD PhD [facility] Endocr[facility]ology

## 2022-02-11 NOTE — Patient Instructions (Addendum)
Please continue: - Tresiba 11 units at bedtime - Lyumjev 2-6 units 3 times a day before meals + correction  - 1:10 ICR, target 120, ISF 60-65  (- 1:8 ICR with carbs/starches) Try to use a lower Lyumjev dose when you plan to be active after a meal (e.g. ICR of 1:12).  Do not correct hyperglycemia with more than 1 unit.  Do not correct hypoglycemia with more than 2 Ronalee Belts and Ike's.  Let me know if we need to switch to the Luxura pen (half units).  Please continue Levothyroxine 100 mcg daily.  Take the thyroid hormone every day, with water, at least 30 minutes before breakfast, separated by at least 4 hours from: - acid reflux medications - calcium - iron - multivitamins  Please return in 4 months.

## 2022-02-17 ENCOUNTER — Other Ambulatory Visit (HOSPITAL_COMMUNITY): Payer: Self-pay

## 2022-02-18 ENCOUNTER — Other Ambulatory Visit (HOSPITAL_COMMUNITY): Payer: Self-pay

## 2022-02-18 MED ORDER — ATORVASTATIN CALCIUM 20 MG PO TABS
ORAL_TABLET | ORAL | 1 refills | Status: DC
  Filled 2022-02-18: qty 45, 90d supply, fill #0
  Filled 2022-05-13: qty 45, 90d supply, fill #1

## 2022-02-19 ENCOUNTER — Other Ambulatory Visit (HOSPITAL_COMMUNITY): Payer: Self-pay

## 2022-02-19 MED ORDER — LISINOPRIL 2.5 MG PO TABS
2.5000 mg | ORAL_TABLET | Freq: Every day | ORAL | 1 refills | Status: DC
  Filled 2022-02-19: qty 90, 90d supply, fill #0
  Filled 2022-05-22: qty 90, 90d supply, fill #1

## 2022-03-07 ENCOUNTER — Other Ambulatory Visit (HOSPITAL_COMMUNITY): Payer: Self-pay

## 2022-03-12 ENCOUNTER — Ambulatory Visit (INDEPENDENT_AMBULATORY_CARE_PROVIDER_SITE_OTHER): Payer: 59 | Admitting: Nurse Practitioner

## 2022-03-12 ENCOUNTER — Encounter: Payer: Self-pay | Admitting: Nurse Practitioner

## 2022-03-12 ENCOUNTER — Other Ambulatory Visit (HOSPITAL_COMMUNITY)
Admission: RE | Admit: 2022-03-12 | Discharge: 2022-03-12 | Disposition: A | Discharge status: Home / Self Care | Payer: 59 | Source: Ambulatory Visit | Attending: Nurse Practitioner | Admitting: Nurse Practitioner

## 2022-03-12 VITALS — BP 122/70 | HR 66 | Resp 14 | Ht 68.0 in | Wt 149.0 lb

## 2022-03-12 DIAGNOSIS — Z01419 Encounter for gynecological examination (general) (routine) without abnormal findings: Secondary | ICD-10-CM

## 2022-03-12 DIAGNOSIS — Z780 Asymptomatic menopausal state: Secondary | ICD-10-CM | POA: Diagnosis not present

## 2022-03-12 DIAGNOSIS — M8589 Other specified disorders of bone density and structure, multiple sites: Secondary | ICD-10-CM | POA: Diagnosis not present

## 2022-03-12 NOTE — Progress Notes (Signed)
Maria Evans 07-19-1965 [id number]   History:  56 y.o. G1P0001 presents for annual exam without GYN complaints. 2003 LEEP, 02/2018 positive high risk HPV negative 16/18, normal 2020, 02/2021 normal cytology + HR HPV. Postmenopausal - no HRT, no bleeding. T1DM and hypothyroidism managed by endocrinology, MVP managed by cardiology, anxiety, HLD managed by PCP.   Gynecologic History Patient's last menstrual period was 02/07/2016.   Contraception: post menopausal status Sexually active: Yes  Health maintenance Last Pap: 03/08/2021. Results were: Normal cytology + HR HPV, 1-year repeat per guidelines Last mammogram: 08/29/2021. Results were: Normal Last colonoscopy: age 11 per patient. Results were: Normal Last Dexa: 05/15/2021. Results were: T-score -2.0 FRAX 6.5% / 0.9%  Past medical history, past surgical history, family history and social history were all reviewed and documented. Married 11/2020. 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 Loves to sail. 59 yo son, freshman at [employer], Astronomer.   ROS:  A ROS was performed and pertinent positives and negatives are included.  Exam:  Vitals:   03/12/22 1101  BP: 122/70  Pulse: 66  Resp: 14  Weight: 149 lb (67.6 kg)  Height: '5\' 8"'$  (1.727 m)     Body mass index is 22.66 kg/m.  General appearance:  Normal Thyroid:  Symmetrical, normal in size, without palpable masses or nodularity. Respiratory  Auscultation:  Clear without wheezing or rhonchi Cardiovascular  Auscultation:  Regular rate, without rubs, murmurs or gallops  Edema/varicosities:  Not grossly evident Abdominal  Soft,nontender, without masses, guarding or rebound.  Liver/spleen:  No organomegaly noted  Hernia:  Left femoral bulge with standing, non-tender  Skin  Inspection:  Grossly normal   Breasts: Examined lying and sitting.   Right: Without masses, retractions, discharge or axillary adenopathy.   Left: Without masses,  retractions, discharge or axillary adenopathy. Genitourinary   Inguinal/mons:  Normal without inguinal adenopathy  External genitalia:  Normal appearing vulva with no masses, tenderness, or lesions  BUS/Urethra/Skene's glands:  Normal  Vagina:  Normal appearing with normal color and discharge, no lesions  Cervix:  Normal appearing without discharge or lesions  Uterus:  Normal in size, shape and contour.  Midline and mobile, nontender  Adnexa/parametria:     Rt: Normal in size, without masses or tenderness.   Lt: Normal in size, without masses or tenderness.  Anus and perineum: Normal  Digital rectal exam: Normal sphincter tone without palpated masses or tenderness  Patient informed chaperone available to be present for breast and pelvic exam. Patient has requested no chaperone to be present. Patient has been advised what will be completed during breast and pelvic exam.   Assessment/Plan:  56 y.o. G1P0001 for annual exam.   Well female exam with routine gynecological exam - Plan: Cytology - PAP( [place]). Education provided on SBEs, importance of preventative screenings, current guidelines, high calcium diet, regular exercise, and multivitamin daily. Labs done elsewhere.  Postmenopausal -  No HRT, no bleeding.   Osteopenia of multiple sites - T-score -2.0 without elevated FRAX last year. Continue daily Vitamin D supplement and regular exercise. She consumes high calcium diet. Will repeat DXA next year.   Screening for cervical cancer - 2003 LEEP, 2019 positive HR HPV, 02/2021 normal cytology + HR HPV. Pap with HR HPV today per guidelines.   Screening for breast cancer - Normal mammogram history.  Continue annual screenings.  Normal breast exam today.  Screening for colon cancer - Colonoscopy at age 51 per patient.  Will repeat at [facility]  recommended interval.   Follow up in 1 year for annual.      Maria Evans [facility], 11:31 AM 03/12/2022

## 2022-03-15 LAB — CYTOLOGY - PAP
Comment: NEGATIVE
Diagnosis: UNDETERMINED — AB
High risk HPV: POSITIVE — AB

## 2022-03-18 ENCOUNTER — Other Ambulatory Visit: Payer: Self-pay

## 2022-03-18 DIAGNOSIS — R87610 Atypical squamous cells of undetermined significance on cytologic smear of cervix (ASC-US): Secondary | ICD-10-CM

## 2022-03-19 ENCOUNTER — Other Ambulatory Visit (HOSPITAL_COMMUNITY): Payer: Self-pay

## 2022-03-19 NOTE — Progress Notes (Unsigned)
  Subjective:     Patient ID: Maria Evans, female   DOB: 22-Jul-1965, 56 y.o.   MRN: [id number]  HPI Patient here today for colposcopy with pap 03-12-22 ASCUS:Pos HR HPV.  PAP HISTORY: 02-22-22 ASCUS:POS HR HPV 03-08-21 Neg:Pos HR HPV 03-07-20 Neg:Neg HR HPV 03-03-19 Neg 02/2018 Pos HR HPV;Neg 16/18  LMP 4 years ago.  Blood sugar is 169 right now.    Review of Systems  All other systems reviewed and are negative.  LMP: PMP     Objective:   Physical Exam Colposcopy - cervix, vagina, and vulva.  Consent for procedure.  3% acetic acid used in vagina and on vulva. White light and green light filter used.  Colposcopy satisfactory:  Yes   __x___          No    _____ Findings:    Cervix:  minimal acetowhite change at 12:00 . Vagina: no lesions Vulva:  no lesions Biopsies:   ECC, biopsy at 12:00 (done after prep with Hibiclens and tenaculum to anterior cervical lip) Minimal EBL. No complications.      Assessment:     ASCUS, positive HR HPV Hx LEEP.    Plan:     Fu biopsies.  Post colposcopy instructions to patient.

## 2022-03-20 ENCOUNTER — Ambulatory Visit: Payer: 59 | Admitting: Obstetrics and Gynecology

## 2022-03-20 ENCOUNTER — Other Ambulatory Visit (HOSPITAL_COMMUNITY)
Admission: RE | Admit: 2022-03-20 | Discharge: 2022-03-20 | Disposition: A | Discharge status: Home / Self Care | Payer: 59 | Source: Ambulatory Visit | Attending: Obstetrics and Gynecology | Admitting: Obstetrics and Gynecology

## 2022-03-20 ENCOUNTER — Encounter: Payer: Self-pay | Admitting: Obstetrics and Gynecology

## 2022-03-20 DIAGNOSIS — R87810 Cervical high risk human papillomavirus (HPV) DNA test positive: Secondary | ICD-10-CM

## 2022-03-20 DIAGNOSIS — R87610 Atypical squamous cells of undetermined significance on cytologic smear of cervix (ASC-US): Secondary | ICD-10-CM | POA: Diagnosis not present

## 2022-03-20 DIAGNOSIS — N888 Other specified noninflammatory disorders of cervix uteri: Secondary | ICD-10-CM | POA: Diagnosis not present

## 2022-03-20 DIAGNOSIS — N72 Inflammatory disease of cervix uteri: Secondary | ICD-10-CM | POA: Diagnosis not present

## 2022-03-20 DIAGNOSIS — N870 Mild cervical dysplasia: Secondary | ICD-10-CM | POA: Diagnosis not present

## 2022-03-20 DIAGNOSIS — B977 Papillomavirus as the cause of diseases classified elsewhere: Secondary | ICD-10-CM | POA: Diagnosis not present

## 2022-03-20 NOTE — Patient Instructions (Signed)
Colposcopy, Care After  The following information offers guidance on how to care for yourself after your procedure. Your health care provider may also give you more specific instructions. If you have problems or questions, contact your health care provider. What can I expect after the procedure? If you had a colposcopy without a biopsy, you can expect to feel fine right away after your procedure. However, you may have some spotting of blood for a few days. You can return to your normal activities. If you had a colposcopy with a biopsy, it is common after the procedure to have: Soreness and mild pain. These may last for a few days. Mild vaginal bleeding or discharge that is dark-colored and grainy. This may last for a few days. The discharge may be caused by a liquid (solution) that was used during the procedure. You may need to wear a sanitary pad during this time. Spotting of blood for at least 48 hours after the procedure. Follow these instructions at home: Medicines Take over-the-counter and prescription medicines only as told by your health care provider. Talk with your health care provider about what type of over-the-counter pain medicines and prescription medicines you can start to take again. It is especially important to talk with your health care provider if you take blood thinners. Activity Avoid using douche products, using tampons, and having sex for at least 3 days after the procedure or for as long as told by your health care provider. Return to your normal activities as told by your health care provider. Ask your health care provider what activities are safe for you. General instructions Ask your health care provider if you may take baths, swim, or use a hot tub. You may take showers. If you use birth control (contraception), continue to use it. Keep all follow-up visits. This is important. Contact a health care provider if: You have a fever or chills. You faint or feel  light-headed. Get help right away if: You have heavy bleeding from your vagina or pass blood clots. Heavy bleeding is bleeding that soaks through a sanitary pad in less than 1 hour. You have vaginal discharge that is abnormal, is yellow in color, or smells bad. This could be a sign of infection. You have severe pain or cramps in your lower abdomen that do not go away with medicine. Summary If you had a colposcopy without a biopsy, you can expect to feel fine right away, but you may have some spotting of blood for a few days. You can return to your normal activities. If you had a colposcopy with a biopsy, it is common to have mild pain for a few days and spotting for 48 hours after the procedure. Avoid using douche products, using tampons, and having sex for at least 3 days after the procedure or for as long as told by your health care provider. Get help right away if you have heavy bleeding, severe pain, or signs of infection. This information is not intended to replace advice given to you by your health care provider. Make sure you discuss any questions you have with your health care provider. Document Revised: 11/05/2020 Document Reviewed: 11/05/2020 Elsevier Patient Education  2023 Elsevier Inc.  

## 2022-03-22 ENCOUNTER — Other Ambulatory Visit (HOSPITAL_COMMUNITY): Payer: Self-pay

## 2022-03-22 LAB — SURGICAL PATHOLOGY

## 2022-03-25 ENCOUNTER — Other Ambulatory Visit (HOSPITAL_COMMUNITY): Payer: Self-pay

## 2022-04-11 ENCOUNTER — Other Ambulatory Visit (HOSPITAL_COMMUNITY): Payer: Self-pay

## 2022-04-14 ENCOUNTER — Telehealth: Payer: 59 | Admitting: Nurse Practitioner

## 2022-04-14 DIAGNOSIS — R399 Unspecified symptoms and signs involving the genitourinary system: Secondary | ICD-10-CM

## 2022-04-14 MED ORDER — CEPHALEXIN 500 MG PO CAPS
500.0000 mg | ORAL_CAPSULE | Freq: Two times a day (BID) | ORAL | 0 refills | Status: AC
Start: 2022-04-14 — End: 2022-04-21

## 2022-04-14 NOTE — Progress Notes (Signed)

## 2022-04-19 ENCOUNTER — Telehealth: Payer: 59 | Admitting: Physician Assistant

## 2022-04-19 ENCOUNTER — Ambulatory Visit
Admission: EM | Admit: 2022-04-19 | Discharge: 2022-04-19 | Disposition: A | Discharge status: Home / Self Care | Payer: 59 | Attending: Urgent Care | Admitting: Urgent Care

## 2022-04-19 DIAGNOSIS — R300 Dysuria: Secondary | ICD-10-CM

## 2022-04-19 DIAGNOSIS — R3915 Urgency of urination: Secondary | ICD-10-CM

## 2022-04-19 DIAGNOSIS — R350 Frequency of micturition: Secondary | ICD-10-CM | POA: Diagnosis not present

## 2022-04-19 LAB — POCT URINALYSIS DIP (MANUAL ENTRY)
Bilirubin, UA: NEGATIVE
Blood, UA: NEGATIVE
Glucose, UA: NEGATIVE mg/dL
Ketones, POC UA: NEGATIVE mg/dL
Nitrite, UA: NEGATIVE
Protein Ur, POC: NEGATIVE mg/dL
Spec Grav, UA: 1.01 (ref 1.010–1.025)
Urobilinogen, UA: 0.2 E.U./dL
pH, UA: 7 (ref 5.0–8.0)

## 2022-04-19 NOTE — Progress Notes (Signed)
Virtual Visit Consent   Maria Evans, you are scheduled for a virtual visit with a [facility] provider today. Just as with appointments in the office, your consent must be obtained to participate. Your consent will be active for this visit and any virtual visit you may have with one of our providers in the next 365 days. If you have a MyChart account, a copy of this consent can be sent to you electronically.  As this is a virtual visit, video technology does not allow for your provider to perform a traditional examination. This may limit your provider's ability to fully assess your condition. If your provider identifies any concerns that need to be evaluated in person or the need to arrange testing (such as labs, EKG, etc.), we will make arrangements to do so. Although advances in technology are sophisticated, we cannot ensure that it will always work on either your end or our end. If the connection with a video visit is poor, the visit may have to be switched to a telephone visit. With either a video or telephone visit, we are not always able to ensure that we have a secure connection.  By engaging in this virtual visit, you consent to the provision of healthcare and authorize for your insurance to be billed (if applicable) for the services provided during this visit. Depending on your insurance coverage, you may receive a charge related to this service.  I need to obtain your verbal consent now. Are you willing to proceed with your visit today? [name] has provided verbal consent on 04/19/2022 for a virtual visit (video or telephone). [name], [place]  Date: 04/19/2022 3:40 PM  Virtual Visit via Video Note   I, Maria Evans, connected with  Maria Evans  ([id number], 22-Sep-1965) on 04/19/22 at  3:30 PM EDT by a video-enabled telemedicine application and verified that I am speaking with the correct person using two identifiers.  Location: Patient: Virtual Visit Location  Patient: Other: work Provider: Scientist, research (medical) Provider: Home   I discussed the limitations of evaluation and management by telemedicine and the availability of in person appointments. The patient expressed understanding and agreed to proceed.    History of Present Illness: Maria Evans is a 56 y.o. who identifies as a female who was assigned female at birth, and is being seen today for uti sxs.  Pt states for 1 week she has had urinary sxs. She has had dysuria and urgency. She was prescribed keflex and has been on it for 5 days. Initially sxs were improving but today they have worsened and her urgency is worse than before she started antibiotics.   She reports some abd fullness, but denies fevers.   States sugars have been well controlled.   HPI: HPI  Problems:  Patient Active Problem List   Diagnosis Date Noted   Hypothyroidism 11/09/2021   Type 1 diabetes mellitus with hyperglycemia ([place]) 08/22/2020   History of mitral valve prolapse in adulthood 08/23/2010    Allergies:  Allergies  Allergen Reactions   Nitrofurantoin Itching and Other (See Comments)   Medications:  Current Outpatient Medications:    ALPRAZolam (XANAX) 0.5 MG tablet, Take 0.5 mg by mouth as needed for anxiety or sleep., Disp: , Rfl:    atorvastatin (LIPITOR) 20 MG tablet, Take 1 tablet by mouth every other day, Disp: 45 tablet, Rfl: 1   Blood Pressure Monitoring (OMRON 3 SERIES BP MONITOR) DEVI, Use as directed, Disp: 1 each,  Rfl: 0   cephALEXin (KEFLEX) 500 MG capsule, Take 1 capsule (500 mg total) by mouth 2 (two) times daily for 7 days., Disp: 14 capsule, Rfl: 0   cetirizine (ZYRTEC) 10 MG tablet, Take 10 mg by mouth daily., Disp: , Rfl:    Continuous Blood Gluc Receiver (DEXCOM G6 RECEIVER) DEVI, Use to check blood sugar 4 times daily, Disp: 1 each, Rfl: 0   Continuous Blood Gluc Sensor (DEXCOM G6 SENSOR) MISC, USE WITH DEXCOM TO CHECK BLOOD SUGAR 4 TIMES DAILY, Disp: 9 each, Rfl: 3   Continuous  Blood Gluc Transmit (DEXCOM G6 TRANSMITTER) MISC, USE WITH DEXCOM TO CHECK BLOOD SUGAR 4 TIMES DAILY, Disp: 1 each, Rfl: 3   cyclobenzaprine (FLEXERIL) 10 MG tablet, Take 0.5-1 tablets (5-10 mg total) by mouth 3 (three) times daily as needed for muscle spasms., Disp: 30 tablet, Rfl: 0   escitalopram (LEXAPRO) 20 MG tablet, Take 20 mg by mouth daily., Disp: , Rfl:    escitalopram (LEXAPRO) 20 MG tablet, Take 1 tablet by mouth daily, Disp: 90 tablet, Rfl: 1   fluocinonide gel (LIDEX) 0.05 %, Apply to affected areas twice daily as needed for flares, Disp: 30 g, Rfl: 1   Glucagon 3 MG/DOSE POWD, Place 3 mg into the nose once as needed for up to 1 dose., Disp: 1 each, Rfl: 11   GVOKE HYPOPEN 1-PACK 1 MG/0.2ML SOAJ, Inject 0.2 mLs into the skin as needed., Disp: 0.2 mL, Rfl: PRN   insulin degludec (TRESIBA FLEXTOUCH) 100 UNIT/ML FlexTouch Pen, INJECT UP TO 13 UNITS UNDER SKIN DAILY, Disp: 9 mL, Rfl: 3   insulin degludec (TRESIBA) 100 UNIT/ML FlexTouch Pen, Inject 12-13 Units into the skin daily., Disp: , Rfl:    Insulin Lispro-aabc (LYUMJEV KWIKPEN) 100 UNIT/ML KwikPen, Inject up to 8 units before each meal 3-4 times a day, Disp: 15 mL, Rfl: 11   Insulin Pen Needle (UNIFINE PENTIPS) 32G X 4 MM MISC, Use 4 - 6 times daily as directed, Disp: 300 each, Rfl: 3   levothyroxine (SYNTHROID) 100 MCG tablet, Take 1 tablet (100 mcg total) by mouth daily before breakfast., Disp: 90 tablet, Rfl: 3   lisinopril (ZESTRIL) 2.5 MG tablet, Take 1 tablet by mouth once a day, Disp: 90 tablet, Rfl: 1   Multiple Vitamin (MULTIVITAMIN) capsule, Take 1 capsule by mouth daily., Disp: , Rfl:    traMADol (ULTRAM) 50 MG tablet, Take 1 tablet by mouth once daily as needed, Disp: 15 tablet, Rfl: 0   valACYclovir (VALTREX) 1000 MG tablet, Take 1,000 mg by mouth as needed (fever blisters)., Disp: , Rfl:   Observations/Objective: Patient is well-developed, well-nourished in no acute distress.  Sitting comfortably  Head is  normocephalic, atraumatic.  No labored breathing.  Speech is clear and coherent with logical content.  Patient is alert and oriented at baseline.    Assessment and Plan: 1. Dysuria  Pt with worsening uti sxs despite 5 days of keflex. Recommended in person eval for ua/urine culture. She will seek eval at [facility]   Follow Up Instructions: I discussed the assessment and treatment plan with the patient. The patient was provided an opportunity to ask questions and all were answered. The patient agreed with the plan and demonstrated an understanding of the instructions.  A copy of instructions were sent to the patient via MyChart unless otherwise noted below.   The patient was advised to call back or seek an in-person evaluation if the symptoms worsen or if the condition fails to improve  as anticipated.  Time:  I spent 10 minutes with the patient via telehealth technology discussing the above problems/concerns.    Laroy Mustard Gilman Schmidt, PA-C

## 2022-04-19 NOTE — Discharge Instructions (Addendum)

## 2022-04-19 NOTE — ED Provider Notes (Signed)
[facility] - URGENT CARE CENTER  Note:  This document was prepared using Systems analyst and may include unintentional dictation errors.  MRN: [id number] DOB: 01/05/1966  Subjective:   Maria Evans is a 56 y.o. female with pmh of DM Type 1 presenting for acute onset since this morning of recurrent dysuria, urinary frequency and urgency.  Patient underwent a course of Keflex from a video visit she did on Sunday.  No urinalysis or urine culture was ordered.  She had some improvement in her symptoms but then symptoms started back up.  Denies fever, nausea, vomiting, vaginal discharge, vaginal irritation.  No current facility-administered medications for this encounter.  Current Outpatient Medications:    atorvastatin (LIPITOR) 20 MG tablet, Take 1 tablet by mouth every other day, Disp: 45 tablet, Rfl: 1   cephALEXin (KEFLEX) 500 MG capsule, Take 1 capsule (500 mg total) by mouth 2 (two) times daily for 7 days., Disp: 14 capsule, Rfl: 0   cetirizine (ZYRTEC) 10 MG tablet, Take 10 mg by mouth daily., Disp: , Rfl:    Continuous Blood Gluc Receiver (DEXCOM G6 RECEIVER) DEVI, Use to check blood sugar 4 times daily, Disp: 1 each, Rfl: 0   Continuous Blood Gluc Sensor (DEXCOM G6 SENSOR) MISC, USE WITH DEXCOM TO CHECK BLOOD SUGAR 4 TIMES DAILY, Disp: 9 each, Rfl: 3   Continuous Blood Gluc Transmit (DEXCOM G6 TRANSMITTER) MISC, USE WITH DEXCOM TO CHECK BLOOD SUGAR 4 TIMES DAILY, Disp: 1 each, Rfl: 3   cyclobenzaprine (FLEXERIL) 10 MG tablet, Take 0.5-1 tablets (5-10 mg total) by mouth 3 (three) times daily as needed for muscle spasms., Disp: 30 tablet, Rfl: 0   escitalopram (LEXAPRO) 20 MG tablet, Take 1 tablet by mouth daily, Disp: 90 tablet, Rfl: 1   insulin degludec (TRESIBA FLEXTOUCH) 100 UNIT/ML FlexTouch Pen, INJECT UP TO 13 UNITS UNDER SKIN DAILY, Disp: 9 mL, Rfl: 3   Insulin Lispro-aabc (LYUMJEV KWIKPEN) 100 UNIT/ML KwikPen, Inject up to 8 units before each meal 3-4 times  a day, Disp: 15 mL, Rfl: 11   Insulin Pen Needle (UNIFINE PENTIPS) 32G X 4 MM MISC, Use 4 - 6 times daily as directed, Disp: 300 each, Rfl: 3   levothyroxine (SYNTHROID) 100 MCG tablet, Take 1 tablet (100 mcg total) by mouth daily before breakfast., Disp: 90 tablet, Rfl: 3   lisinopril (ZESTRIL) 2.5 MG tablet, Take 1 tablet by mouth once a day, Disp: 90 tablet, Rfl: 1   Multiple Vitamin (MULTIVITAMIN) capsule, Take 1 capsule by mouth daily., Disp: , Rfl:    ALPRAZolam (XANAX) 0.5 MG tablet, Take 0.5 mg by mouth as needed for anxiety or sleep., Disp: , Rfl:    Blood Pressure Monitoring (OMRON 3 SERIES BP MONITOR) DEVI, Use as directed, Disp: 1 each, Rfl: 0   escitalopram (LEXAPRO) 20 MG tablet, Take 20 mg by mouth daily., Disp: , Rfl:    fluocinonide gel (LIDEX) 0.05 %, Apply to affected areas twice daily as needed for flares, Disp: 30 g, Rfl: 1   Glucagon 3 MG/DOSE POWD, Place 3 mg into the nose once as needed for up to 1 dose., Disp: 1 each, Rfl: 11   GVOKE HYPOPEN 1-PACK 1 MG/0.2ML SOAJ, Inject 0.2 mLs into the skin as needed., Disp: 0.2 mL, Rfl: PRN   insulin degludec (TRESIBA) 100 UNIT/ML FlexTouch Pen, Inject 12-13 Units into the skin daily., Disp: , Rfl:    traMADol (ULTRAM) 50 MG tablet, Take 1 tablet by mouth once daily as needed,  Disp: 15 tablet, Rfl: 0   valACYclovir (VALTREX) 1000 MG tablet, Take 1,000 mg by mouth as needed (fever blisters)., Disp: , Rfl:    Allergies  Allergen Reactions   Nitrofurantoin Itching and Other (See Comments)    Past Medical History:  Diagnosis Date   Anxiety    Anxiety with depression    Controlled.   Common migraine without aura    Diabetes mellitus type I, controlled ([place])    Treated with 4 times daily insulin based on blood sugar monitoring   Hyperlipidemia due to type 1 diabetes mellitus ([place])    Currently on atorvastatin.   Hypothyroid    Lipoma    MVP (mitral valve prolapse) 2005   Renally diagnosed back in 2005 (during her pregnancy)_,  most recently evaluated in 2012.  ([facility], Dr. Delman Cheadle); 11/2010 TTE: Moderate MR, posteriorly directed (recommend TEE) => TEE (01/2011) - mild MVP (anterior leaflet) with Mild-Mod MR.;      Past Surgical History:  Procedure Laterality Date   LAPAROSCOPIC LYSIS OF ADHESIONS     LIPOMA EXCISION Left    LUMBAR MICRODISCECTOMY     TRANSESOPHAGEAL ECHOCARDIOGRAM  02/13/2011   [facility] -> Dr. Delman Cheadle [facility]): EF 55 to 60%.  Borderline LA dilation.  Prolapse of A2 scallop of mitral valve-mild MVP with mild to moderate MR.  (2 eccentric jets, posteriorly directed, c/w anterior leaflet pathology))   TRANSTHORACIC ECHOCARDIOGRAM  12/11/2010   [facility] -> Dr. Delman Cheadle [facility]): EF 35 to 6%.  Mild LA dilation.  Moderate MR-eccentric/possibly directed jet consistent with antilipid pathology.  Mild TR, LP HTN, mild PR.  Recommend TEE.   TREADMILL STRESS ECHOCARDIOGRAM     [facility] -> Dr. Delman Cheadle [facility]): Exercised 10 minutes (11 minutes), achieved Max Heart Rate 162 = 92% Max Predicted Heart Rate, clinically and electrocardiographically negative.  Nonspecific ST and T wave changes.  Prestress echo showed EF 55 to 60% with normal wall motion.  Post-Stress: EF 65 to 70% with nl augmentation of all walls.=> NORMAL STRESS [facility]    Family History  Problem Relation Age of Onset   Hyperlipidemia Mother    Hyperlipidemia Father    Heart Problems Father    Esophageal cancer Maternal Grandfather        Long-term smoker   CAD Paternal Grandmother        Long-term smoker   Heart attack Paternal Grandmother 60       Cause of death   Stroke Paternal Grandmother     Social History   Tobacco Use   Smoking status: Never   Smokeless tobacco: Never  Vaping Use   Vaping Use: Never used  Substance Use Topics   Alcohol use: Yes     Comment: occ, wine or beer   Drug use: Never    ROS   Objective:   Vitals: BP 120/76 (BP Location: Right Arm)   Pulse 75   Temp (!) 97.5 F (36.4 C) (Oral)   Resp 18   LMP 02/07/2016   SpO2 96%   Physical Exam Constitutional:      General: She is not in acute distress.    Appearance: Normal appearance. She is well-developed. She is not ill-appearing, toxic-appearing or diaphoretic.  HENT:     Head: Normocephalic and atraumatic.     Nose: Nose normal.     Mouth/Throat:     Mouth: Mucous membranes are moist.  Eyes:  General: No scleral icterus.       Right eye: No discharge.        Left eye: No discharge.     Extraocular Movements: Extraocular movements intact.  Cardiovascular:     Rate and Rhythm: Normal rate.  Pulmonary:     Effort: Pulmonary effort is normal.  Skin:    General: Skin is warm and dry.  Neurological:     General: No focal deficit present.     Mental Status: She is alert and oriented to person, place, and time.  Psychiatric:        Mood and Affect: Mood normal.        Behavior: Behavior normal.    Results for orders placed or performed during the hospital encounter of 04/19/22 (from the past 24 hour(s))  POCT urinalysis dipstick     Status: Abnormal   Collection Time: 04/19/22  4:30 PM  Result Value Ref Range   Color, UA yellow yellow   Clarity, UA clear clear   Glucose, UA negative negative mg/dL   Bilirubin, UA negative negative   Ketones, POC UA negative negative mg/dL   Spec Grav, UA 1.010 1.010 - 1.025   Blood, UA negative negative   pH, UA 7.0 5.0 - 8.0   Protein Ur, POC negative negative mg/dL   Urobilinogen, UA 0.2 0.2 or 1.0 E.U./dL   Nitrite, UA Negative Negative   Leukocytes, UA Trace (A) Negative   Assessment and Plan :   PDMP not reviewed this encounter.  1. Urinary frequency   2. Dysuria   3. Urinary urgency    Emphasized need to cut back on her caffeine intake as urinalysis is negative. Urine culture pending.  Counseled patient on potential for adverse effects with medications prescribed/recommended today, ER and return-to-clinic precautions discussed, patient verbalized understanding.    Jaynee Eagles, PA-C 04/19/22 1640

## 2022-04-19 NOTE — Patient Instructions (Signed)
Royetta Asal, thank you for joining [name], PA-C for today's virtual visit.  While this provider is not your primary care provider (PCP), if your PCP is located in our provider database this encounter information will be shared with them immediately following your visit.   [facility] account gives you access to today's visit and all your visits, tests, and labs performed at [facility] " click here if you don't have a [facility] account or go to [email or web address]  Consent: (Patient) Maria Evans provided verbal consent for this virtual visit at the beginning of the encounter.  Current Medications:  Current Outpatient Medications:    ALPRAZolam (XANAX) 0.5 MG tablet, Take 0.5 mg by mouth as needed for anxiety or sleep., Disp: , Rfl:    atorvastatin (LIPITOR) 20 MG tablet, Take 1 tablet by mouth every other day, Disp: 45 tablet, Rfl: 1   Blood Pressure Monitoring (OMRON 3 SERIES BP MONITOR) DEVI, Use as directed, Disp: 1 each, Rfl: 0   cephALEXin (KEFLEX) 500 MG capsule, Take 1 capsule (500 mg total) by mouth 2 (two) times daily for 7 days., Disp: 14 capsule, Rfl: 0   cetirizine (ZYRTEC) 10 MG tablet, Take 10 mg by mouth daily., Disp: , Rfl:    Continuous Blood Gluc Receiver (DEXCOM G6 RECEIVER) DEVI, Use to check blood sugar 4 times daily, Disp: 1 each, Rfl: 0   Continuous Blood Gluc Sensor (DEXCOM G6 SENSOR) MISC, USE WITH DEXCOM TO CHECK BLOOD SUGAR 4 TIMES DAILY, Disp: 9 each, Rfl: 3   Continuous Blood Gluc Transmit (DEXCOM G6 TRANSMITTER) MISC, USE WITH DEXCOM TO CHECK BLOOD SUGAR 4 TIMES DAILY, Disp: 1 each, Rfl: 3   cyclobenzaprine (FLEXERIL) 10 MG tablet, Take 0.5-1 tablets (5-10 mg total) by mouth 3 (three) times daily as needed for muscle spasms., Disp: 30 tablet, Rfl: 0   escitalopram (LEXAPRO) 20 MG tablet, Take 20 mg by mouth daily., Disp: , Rfl:    escitalopram (LEXAPRO) 20 MG tablet, Take 1 tablet by mouth daily, Disp: 90  tablet, Rfl: 1   fluocinonide gel (LIDEX) 0.05 %, Apply to affected areas twice daily as needed for flares, Disp: 30 g, Rfl: 1   Glucagon 3 MG/DOSE POWD, Place 3 mg into the nose once as needed for up to 1 dose., Disp: 1 each, Rfl: 11   GVOKE HYPOPEN 1-PACK 1 MG/0.2ML SOAJ, Inject 0.2 mLs into the skin as needed., Disp: 0.2 mL, Rfl: PRN   insulin degludec (TRESIBA FLEXTOUCH) 100 UNIT/ML FlexTouch Pen, INJECT UP TO 13 UNITS UNDER SKIN DAILY, Disp: 9 mL, Rfl: 3   insulin degludec (TRESIBA) 100 UNIT/ML FlexTouch Pen, Inject 12-13 Units into the skin daily., Disp: , Rfl:    Insulin Lispro-aabc (LYUMJEV KWIKPEN) 100 UNIT/ML KwikPen, Inject up to 8 units before each meal 3-4 times a day, Disp: 15 mL, Rfl: 11   Insulin Pen Needle (UNIFINE PENTIPS) 32G X 4 MM MISC, Use 4 - 6 times daily as directed, Disp: 300 each, Rfl: 3   levothyroxine (SYNTHROID) 100 MCG tablet, Take 1 tablet (100 mcg total) by mouth daily before breakfast., Disp: 90 tablet, Rfl: 3   lisinopril (ZESTRIL) 2.5 MG tablet, Take 1 tablet by mouth once a day, Disp: 90 tablet, Rfl: 1   Multiple Vitamin (MULTIVITAMIN) capsule, Take 1 capsule by mouth daily., Disp: , Rfl:    traMADol (ULTRAM) 50 MG tablet, Take 1 tablet by mouth once daily as needed, Disp: 15 tablet, Rfl: 0  valACYclovir (VALTREX) 1000 MG tablet, Take 1,000 mg by mouth as needed (fever blisters)., Disp: , Rfl:    Medications ordered in this encounter:  No orders of the defined types were placed in this encounter.    *If you need refills on other medications prior to your next appointment, please contact your pharmacy*  Follow-Up: Call back or seek an in-person evaluation if the symptoms worsen or if the condition fails to improve as anticipated.  [facility] [phone]  Other Instructions Please be seen in person for a urinalysis and urine culture.   If you have been instructed to have an in-person evaluation today at a local Urgent Care facility,  please use the link below. It will take you to a list of all of our available [facility], including address, phone number and hours of operation. Please do not delay care.  [facility]  If you or a family member do not have a primary care provider, use the link below to schedule a visit and establish care. When you choose a [facility] primary care physician or advanced practice provider, you gain a long-term partner in health. Find a Primary Care Provider  Learn more about [facility] in-office and virtual care options: [facility]

## 2022-04-19 NOTE — ED Triage Notes (Signed)
Possible UTI, started Sunday, had tela visit and got prescription for keflex started Sunday. Started to feel better, than this morning symptoms came back with burning when urination and frequency.

## 2022-04-20 LAB — URINE CULTURE: Culture: NO GROWTH

## 2022-04-23 ENCOUNTER — Other Ambulatory Visit (HOSPITAL_COMMUNITY): Payer: Self-pay

## 2022-04-24 ENCOUNTER — Other Ambulatory Visit (HOSPITAL_COMMUNITY): Payer: Self-pay

## 2022-04-25 ENCOUNTER — Other Ambulatory Visit (HOSPITAL_COMMUNITY): Payer: Self-pay

## 2022-04-27 ENCOUNTER — Other Ambulatory Visit (HOSPITAL_COMMUNITY): Payer: Self-pay

## 2022-05-13 ENCOUNTER — Other Ambulatory Visit (HOSPITAL_COMMUNITY): Payer: Self-pay

## 2022-05-15 ENCOUNTER — Other Ambulatory Visit (HOSPITAL_COMMUNITY): Payer: Self-pay

## 2022-05-22 ENCOUNTER — Other Ambulatory Visit (HOSPITAL_COMMUNITY): Payer: Self-pay

## 2022-05-22 ENCOUNTER — Other Ambulatory Visit: Payer: Self-pay | Admitting: Internal Medicine

## 2022-05-22 MED ORDER — TECHLITE PEN NEEDLES 32G X 4 MM MISC
3 refills | Status: DC
  Filled 2022-05-22: qty 300, 50d supply, fill #0
  Filled 2022-07-31: qty 300, 50d supply, fill #1
  Filled 2022-09-27: qty 300, 50d supply, fill #2
  Filled 2022-11-15 (×2): qty 300, 50d supply, fill #3

## 2022-05-23 ENCOUNTER — Other Ambulatory Visit (HOSPITAL_COMMUNITY): Payer: Self-pay

## 2022-05-24 ENCOUNTER — Other Ambulatory Visit (HOSPITAL_COMMUNITY): Payer: Self-pay

## 2022-05-29 IMAGING — MG MM DIGITAL SCREENING BILAT W/ TOMO AND CAD
8 series · 9 of 24 positions shown · non-contrast
Comparison: Previous exam(s).

CLINICAL DATA: Screening.

EXAM:
DIGITAL SCREENING BILATERAL MAMMOGRAM WITH TOMOSYNTHESIS AND CAD
TECHNIQUE: Bilateral screening digital craniocaudal and mediolateral oblique
mammograms were obtained. Bilateral screening digital breast
tomosynthesis was performed. The images were evaluated with
computer-aided detection.

[L CC synth-2D]
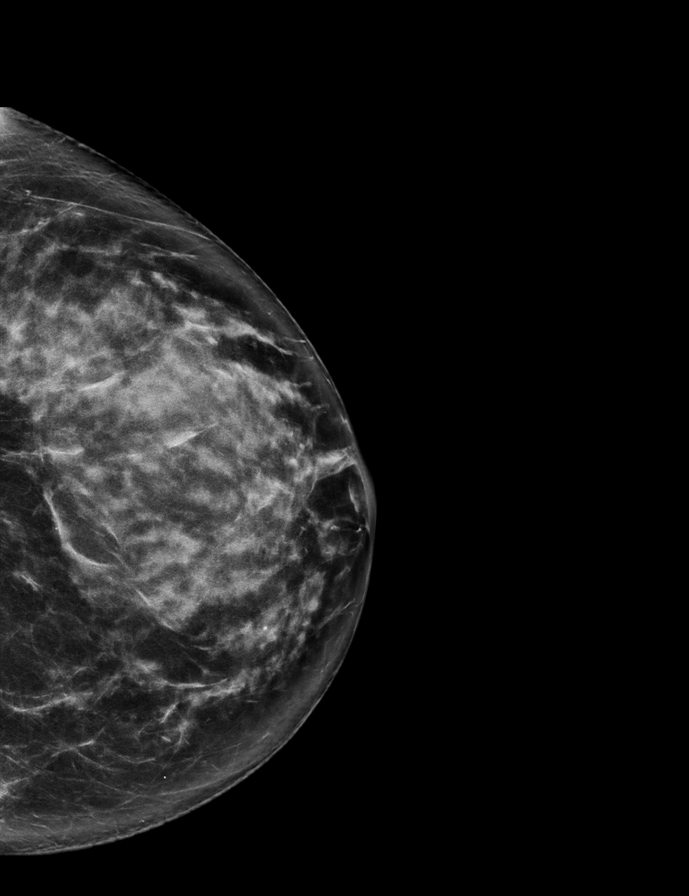

[L MLO synth-2D]
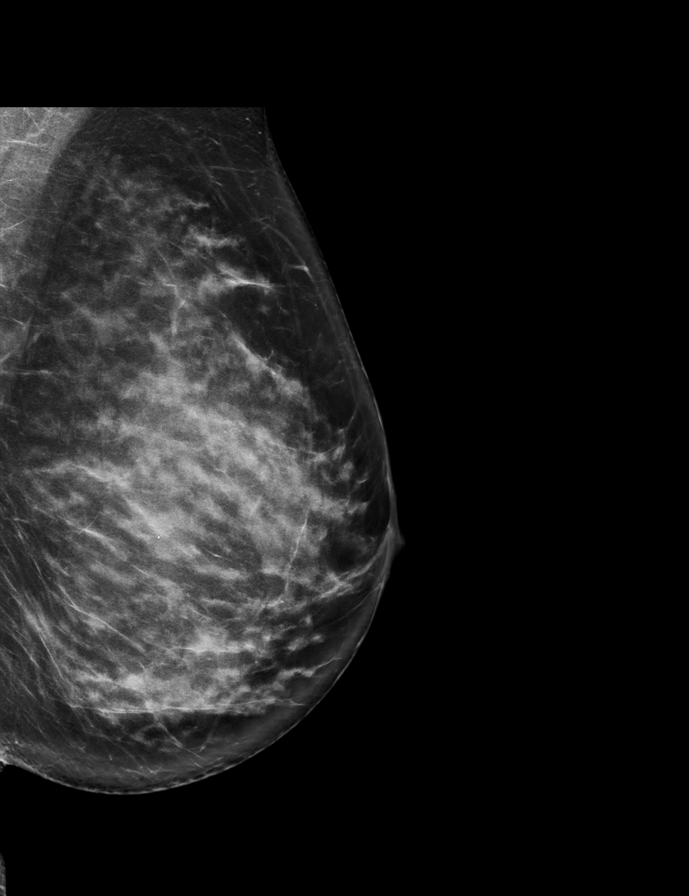

[R CC synth-2D]
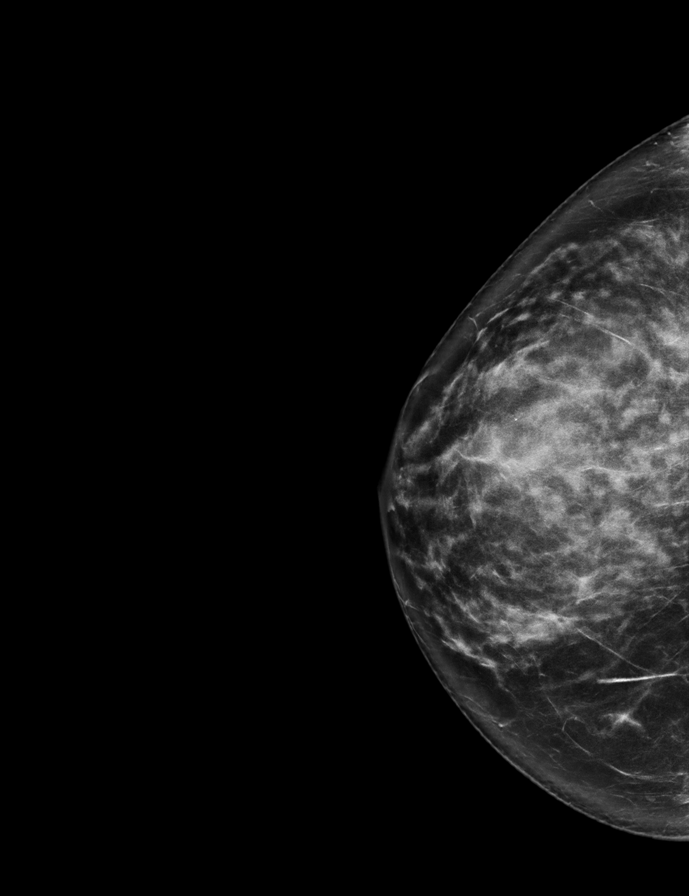

[R MLO synth-2D]
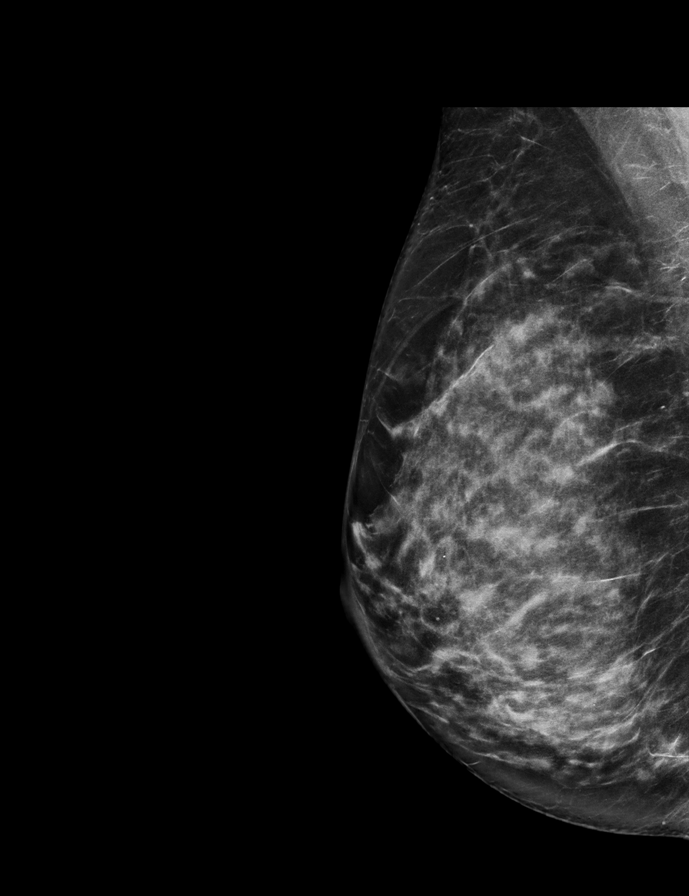

[L CC tomo · 2 of 75 frames shown]
[frame 25/75]
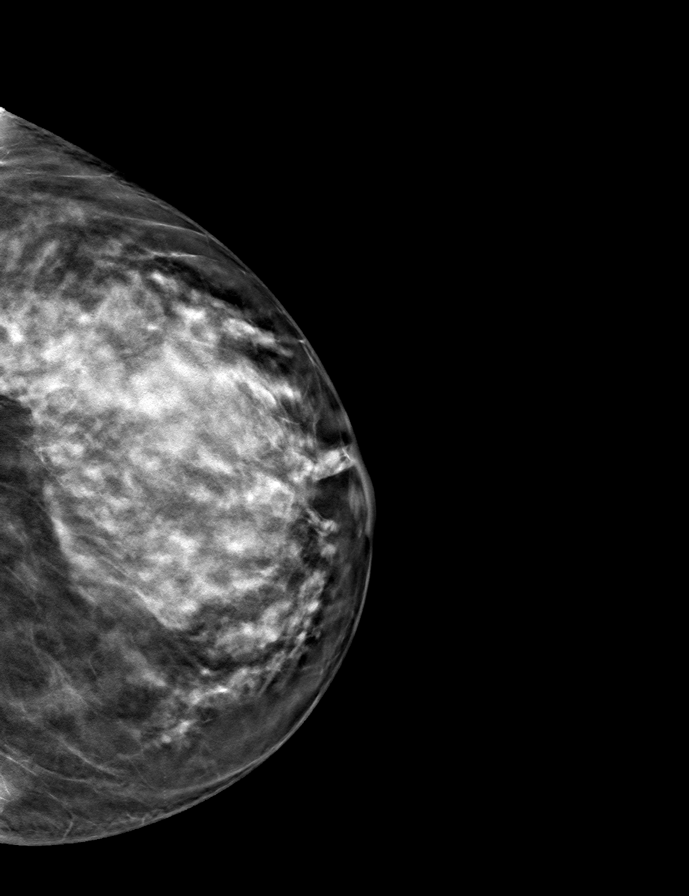
[frame 38/75]
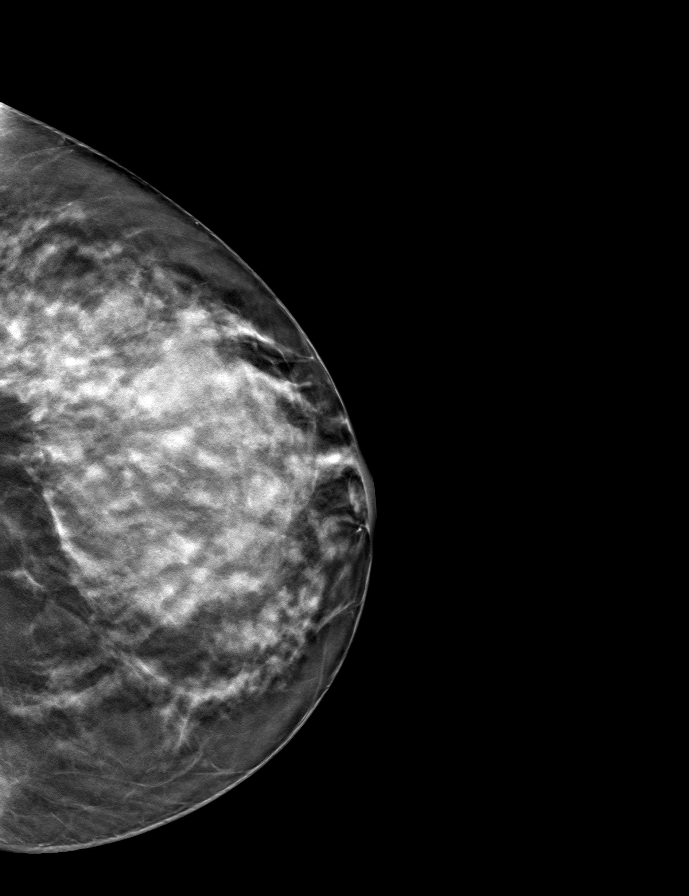

[L MLO tomo · tomo slice 39/78.0]
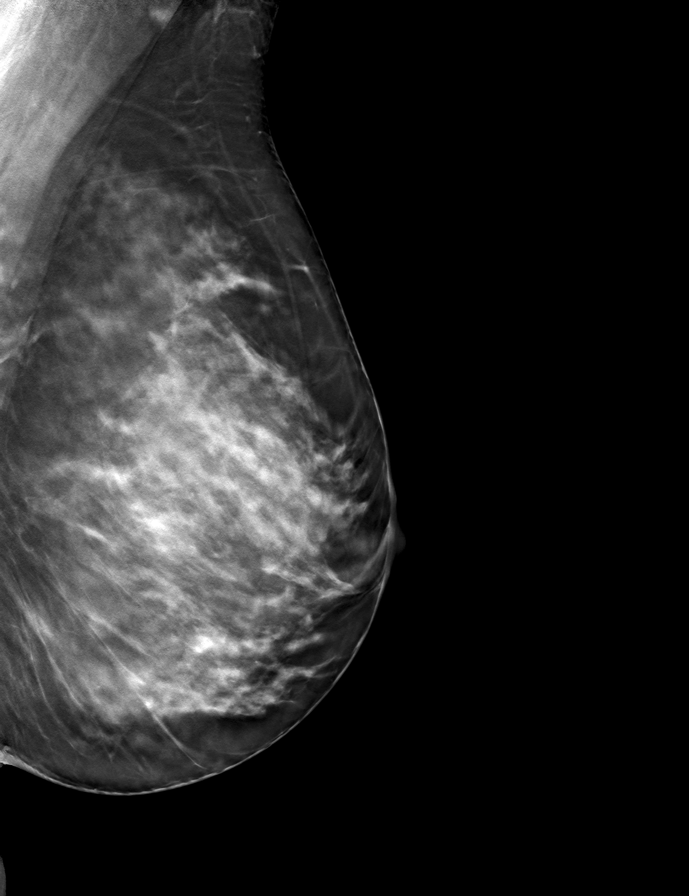

[R MLO tomo · tomo slice 36/71.0]
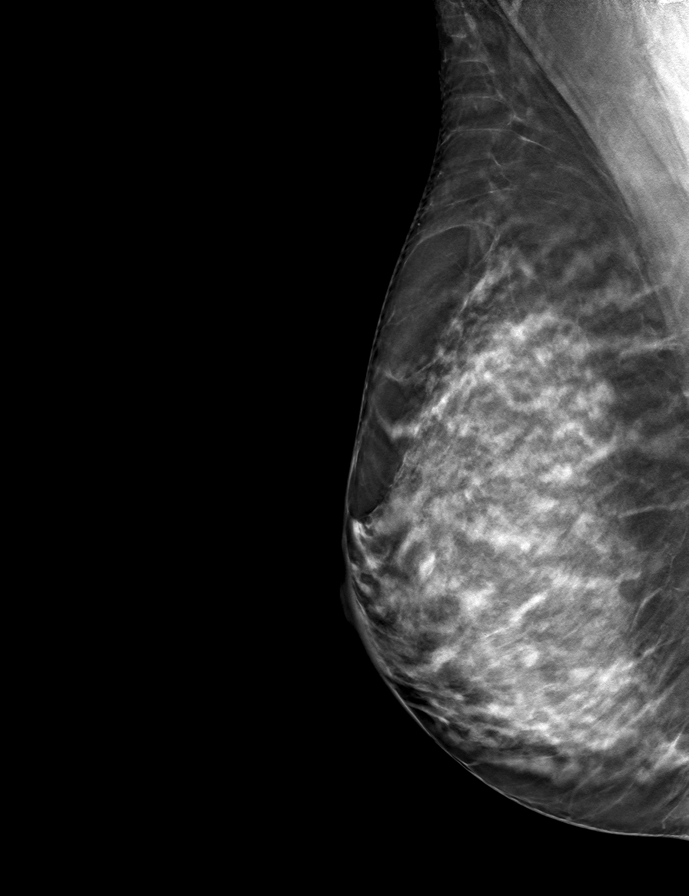

[R CC tomo · tomo slice 37/73.0]
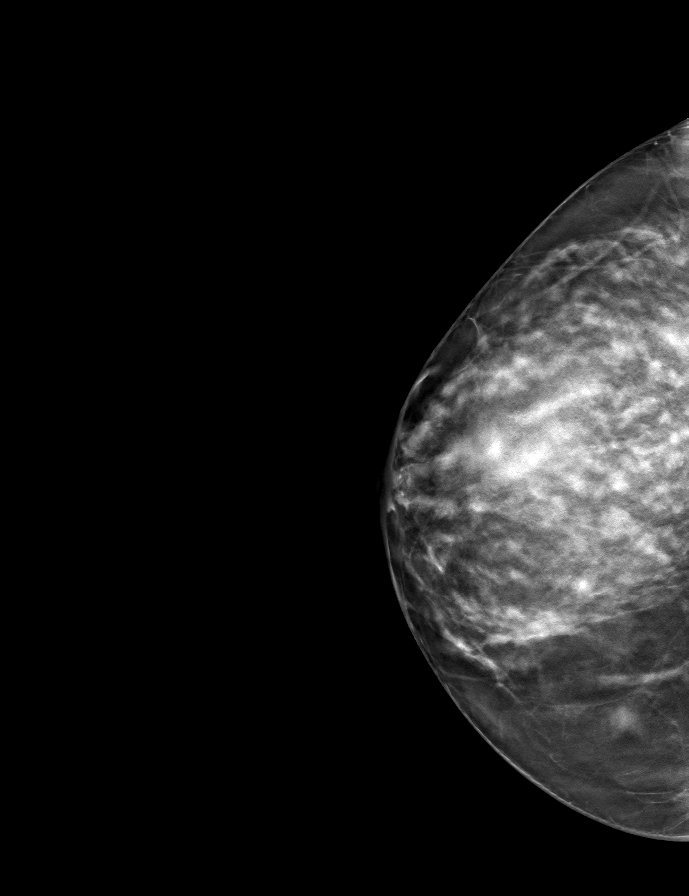

[9 of 24 positions shown; findings below may reference images not displayed]

ACR Breast Density Category d: The breast tissue is extremely dense,
which lowers the sensitivity of mammography
FINDINGS: There are no findings suspicious for malignancy.
IMPRESSION: No mammographic evidence of malignancy. A result letter of this
screening mammogram will be mailed directly to the patient.

RECOMMENDATION:
Screening mammogram in one year. (Code:TA-V-WV9)

BI-RADS CATEGORY  1: Negative.

## 2022-06-06 ENCOUNTER — Other Ambulatory Visit (HOSPITAL_COMMUNITY): Payer: Self-pay

## 2022-06-06 ENCOUNTER — Other Ambulatory Visit: Payer: Self-pay

## 2022-06-06 ENCOUNTER — Other Ambulatory Visit: Payer: Self-pay | Admitting: Internal Medicine

## 2022-06-06 MED ORDER — TRESIBA FLEXTOUCH 100 UNIT/ML ~~LOC~~ SOPN
13.0000 [IU] | PEN_INJECTOR | Freq: Every day | SUBCUTANEOUS | 3 refills | Status: DC
  Filled 2022-06-06: qty 9, 69d supply, fill #0
  Filled 2022-08-20: qty 9, 69d supply, fill #1
  Filled 2022-10-29: qty 9, 69d supply, fill #2
  Filled 2023-01-14 – 2023-01-17 (×2): qty 9, 69d supply, fill #3

## 2022-06-14 ENCOUNTER — Other Ambulatory Visit (HOSPITAL_COMMUNITY): Payer: Self-pay

## 2022-06-14 MED ORDER — FLUOCINONIDE 0.05 % EX GEL
CUTANEOUS | 1 refills | Status: AC
Start: 2022-06-14 — End: ?
  Filled 2022-06-14: qty 30, 30d supply, fill #0
  Filled 2022-08-02 – 2022-08-20 (×2): qty 30, 30d supply, fill #1

## 2022-06-20 ENCOUNTER — Ambulatory Visit: Payer: 59 | Admitting: Internal Medicine

## 2022-06-22 ENCOUNTER — Other Ambulatory Visit (HOSPITAL_COMMUNITY): Payer: Self-pay

## 2022-06-28 ENCOUNTER — Other Ambulatory Visit (HOSPITAL_COMMUNITY): Payer: Self-pay

## 2022-06-28 ENCOUNTER — Ambulatory Visit: Payer: Commercial Managed Care - PPO

## 2022-06-28 ENCOUNTER — Other Ambulatory Visit: Payer: Self-pay | Admitting: Internal Medicine

## 2022-06-28 ENCOUNTER — Ambulatory Visit
Admission: EM | Admit: 2022-06-28 | Discharge: 2022-06-28 | Disposition: A | Discharge status: Home / Self Care | Payer: Commercial Managed Care - PPO | Attending: Urgent Care | Admitting: Urgent Care

## 2022-06-28 DIAGNOSIS — J018 Other acute sinusitis: Secondary | ICD-10-CM | POA: Diagnosis not present

## 2022-06-28 DIAGNOSIS — E109 Type 1 diabetes mellitus without complications: Secondary | ICD-10-CM

## 2022-06-28 MED ORDER — AMOXICILLIN 875 MG PO TABS
875.0000 mg | ORAL_TABLET | Freq: Two times a day (BID) | ORAL | 0 refills | Status: DC
  Filled 2022-06-28: qty 14, 7d supply, fill #0

## 2022-06-28 MED ORDER — PSEUDOEPHEDRINE HCL 30 MG PO TABS
30.0000 mg | ORAL_TABLET | Freq: Three times a day (TID) | ORAL | 0 refills | Status: DC | PRN
Start: 2022-06-28 — End: 2023-07-30
  Filled 2022-06-28: qty 30, 10d supply, fill #0

## 2022-06-28 MED ORDER — PROMETHAZINE-DM 6.25-15 MG/5ML PO SYRP
5.0000 mL | ORAL_SOLUTION | Freq: Three times a day (TID) | ORAL | 0 refills | Status: DC | PRN
  Filled 2022-06-28: qty 200, 14d supply, fill #0

## 2022-06-28 MED ORDER — DEXCOM G6 SENSOR MISC
3 refills | Status: DC
  Filled 2022-06-28 – 2022-07-01 (×2): qty 9, 90d supply, fill #0
  Filled 2022-07-03: qty 3, 30d supply, fill #0
  Filled 2022-08-05 – 2022-08-15 (×2): qty 3, 30d supply, fill #1
  Filled 2022-09-09: qty 3, 30d supply, fill #2
  Filled 2022-10-16: qty 3, 30d supply, fill #3
  Filled 2022-11-15 (×2): qty 3, 30d supply, fill #4
  Filled 2022-12-11: qty 3, 30d supply, fill #5
  Filled 2023-01-14 – 2023-01-17 (×2): qty 3, 30d supply, fill #6
  Filled 2023-02-12: qty 3, 30d supply, fill #7
  Filled 2023-03-13: qty 3, 30d supply, fill #8
  Filled 2023-04-13: qty 3, 30d supply, fill #9
  Filled 2023-05-19: qty 3, 30d supply, fill #10
  Filled 2023-06-16: qty 3, 30d supply, fill #11

## 2022-06-28 NOTE — ED Provider Notes (Signed)
[facility]  Note:  This document was prepared using Systems analyst and may include unintentional dictation errors.  MRN: [id number] DOB: 30-Dec-1965  Subjective:   Maria Evans is a 57 y.o. female presenting for 1 week history of persistent coughing worse at night. Has had slight runny and stuffy nose. No fever, chest pain, shob, wheezing. No smoking. Has been told that she has a history of reactive airway. Has type 1 diabetes, last A1c was 6.3%, managed with insulin.  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 COVID testing was negative.   No current facility-administered medications for this encounter.  Current Outpatient Medications:    ALPRAZolam (XANAX) 0.5 MG tablet, Take 0.5 mg by mouth as needed for anxiety or sleep., Disp: , Rfl:    atorvastatin (LIPITOR) 20 MG tablet, Take 1 tablet by mouth every other day, Disp: 45 tablet, Rfl: 1   Blood Pressure Monitoring (OMRON 3 SERIES BP MONITOR) DEVI, Use as directed, Disp: 1 each, Rfl: 0   cetirizine (ZYRTEC) 10 MG tablet, Take 10 mg by mouth daily., Disp: , Rfl:    Continuous Blood Gluc Receiver (DEXCOM G6 RECEIVER) DEVI, Use to check blood sugar 4 times daily, Disp: 1 each, Rfl: 0   Continuous Blood Gluc Sensor (DEXCOM G6 SENSOR) MISC, USE WITH DEXCOM TO CHECK BLOOD SUGAR 4 TIMES DAILY, Disp: 9 each, Rfl: 3   Continuous Blood Gluc Transmit (DEXCOM G6 TRANSMITTER) MISC, USE WITH DEXCOM TO CHECK BLOOD SUGAR 4 TIMES DAILY, Disp: 1 each, Rfl: 3   cyclobenzaprine (FLEXERIL) 10 MG tablet, Take 0.5-1 tablets (5-10 mg total) by mouth 3 (three) times daily as needed for muscle spasms., Disp: 30 tablet, Rfl: 0   escitalopram (LEXAPRO) 20 MG tablet, Take 20 mg by mouth daily., Disp: , Rfl:    escitalopram (LEXAPRO) 20 MG tablet, Take 1 tablet by mouth daily, Disp: 90 tablet, Rfl: 1   fluocinonide gel (LIDEX) 0.05 %, Apply to affected areas twice daily as needed for flares, Disp: 30  g, Rfl: 1   fluocinonide gel (LIDEX) 0.05 %, Apply Externally Twice a day as needed for flare ups, Disp: 30 g, Rfl: 1   Glucagon 3 MG/DOSE POWD, Place 3 mg into the nose once as needed for up to 1 dose., Disp: 1 each, Rfl: 11   GVOKE HYPOPEN 1-PACK 1 MG/0.2ML SOAJ, Inject 0.2 mLs into the skin as needed., Disp: 0.2 mL, Rfl: PRN   insulin degludec (TRESIBA FLEXTOUCH) 100 UNIT/ML FlexTouch Pen, Inject up to 13 Units into the skin daily., Disp: 9 mL, Rfl: 3   insulin degludec (TRESIBA) 100 UNIT/ML FlexTouch Pen, Inject 12-13 Units into the skin daily., Disp: , Rfl:    Insulin Lispro-aabc (LYUMJEV KWIKPEN) 100 UNIT/ML KwikPen, Inject up to 8 units before each meal 3-4 times a day, Disp: 15 mL, Rfl: 11   Insulin Pen Needle (TECHLITE PEN NEEDLES) 32G X 4 MM MISC, Use 4 - 6 times daily as directed, Disp: 300 each, Rfl: 3   levothyroxine (SYNTHROID) 100 MCG tablet, Take 1 tablet (100 mcg total) by mouth daily before breakfast., Disp: 90 tablet, Rfl: 3   lisinopril (ZESTRIL) 2.5 MG tablet, Take 1 tablet by mouth once a day, Disp: 90 tablet, Rfl: 1   Multiple Vitamin (MULTIVITAMIN) capsule, Take 1 capsule by mouth daily., Disp: , Rfl:    traMADol (ULTRAM) 50 MG tablet, Take 1 tablet by mouth once daily as needed, Disp: 15 tablet, Rfl:  0   valACYclovir (VALTREX) 1000 MG tablet, Take 1,000 mg by mouth as needed (fever blisters)., Disp: , Rfl:    Allergies  Allergen Reactions   Nitrofurantoin Itching and Other (See Comments)    Past Medical History:  Diagnosis Date   Anxiety    Anxiety with depression    Controlled.   Common migraine without aura    Diabetes mellitus type I, controlled ([place])    Treated with 4 times daily insulin based on blood sugar monitoring   Hyperlipidemia due to type 1 diabetes mellitus ([place])    Currently on atorvastatin.   Hypothyroid    Lipoma    MVP (mitral valve prolapse) 2005   Renally diagnosed back in 2005 (during her pregnancy)_, most recently evaluated in 2012.   ([facility], Dr. Delman Cheadle); 11/2010 TTE: Moderate MR, posteriorly directed (recommend TEE) => TEE (01/2011) - mild MVP (anterior leaflet) with Mild-Mod MR.;      Past Surgical History:  Procedure Laterality Date   LAPAROSCOPIC LYSIS OF ADHESIONS     LIPOMA EXCISION Left    LUMBAR MICRODISCECTOMY     TRANSESOPHAGEAL ECHOCARDIOGRAM  02/13/2011   [facility] -> Dr. Delman Cheadle [facility]): EF 55 to 60%.  Borderline LA dilation.  Prolapse of A2 scallop of mitral valve-mild MVP with mild to moderate MR.  (2 eccentric jets, posteriorly directed, c/w anterior leaflet pathology))   TRANSTHORACIC ECHOCARDIOGRAM  12/11/2010   [facility] -> Dr. Delman Cheadle [facility]): EF 35 to 6%.  Mild LA dilation.  Moderate MR-eccentric/possibly directed jet consistent with antilipid pathology.  Mild TR, LP HTN, mild PR.  Recommend TEE.   TREADMILL STRESS ECHOCARDIOGRAM     [facility] -> Dr. Delman Cheadle [facility]): Exercised 10 minutes (11 minutes), achieved Max Heart Rate 162 = 92% Max Predicted Heart Rate, clinically and electrocardiographically negative.  Nonspecific ST and T wave changes.  Prestress echo showed EF 55 to 60% with normal wall motion.  Post-Stress: EF 65 to 70% with nl augmentation of all walls.=> NORMAL STRESS [facility]    Family History  Problem Relation Age of Onset   Hyperlipidemia Mother    Hyperlipidemia Father    Heart Problems Father    Esophageal cancer Maternal Grandfather        Long-term smoker   CAD Paternal Grandmother        Long-term smoker   Heart attack Paternal Grandmother 33       Cause of death   Stroke Paternal Grandmother     Social History   Tobacco Use   Smoking status: Never   Smokeless tobacco: Never  Vaping Use   Vaping Use: Never used  Substance Use Topics   Alcohol use: Yes    Comment: occ, wine or beer   Drug use:  Never    ROS   Objective:   Vitals: BP 122/70 (BP Location: Right Arm)   Pulse 85   Temp 98.1 F (36.7 C) (Oral)   Resp 18   LMP 02/07/2016   SpO2 96%   Physical Exam Constitutional:      General: She is not in acute distress.    Appearance: Normal appearance. She is well-developed and normal weight. She is not ill-appearing, toxic-appearing or diaphoretic.  HENT:     Head: Normocephalic and atraumatic.     Right Ear: Tympanic membrane, ear canal and external ear normal. No drainage or tenderness. No middle ear effusion. There is no impacted cerumen.  Tympanic membrane is not erythematous or bulging.     Left Ear: Tympanic membrane, ear canal and external ear normal. No drainage or tenderness.  No middle ear effusion. There is no impacted cerumen. Tympanic membrane is not erythematous or bulging.     Nose: Congestion present. No rhinorrhea.     Mouth/Throat:     Mouth: Mucous membranes are moist. No oral lesions.     Pharynx: No pharyngeal swelling, oropharyngeal exudate, posterior oropharyngeal erythema or uvula swelling.     Tonsils: No tonsillar exudate or tonsillar abscesses.     Comments: Significant post-nasal drainage overlying pharynx.  Eyes:     General: No scleral icterus.       Right eye: No discharge.        Left eye: No discharge.     Extraocular Movements: Extraocular movements intact.     Right eye: Normal extraocular motion.     Left eye: Normal extraocular motion.     Conjunctiva/sclera: Conjunctivae normal.  Cardiovascular:     Rate and Rhythm: Normal rate and regular rhythm.     Heart sounds: Normal heart sounds. No murmur heard.    No friction rub. No gallop.  Pulmonary:     Effort: Pulmonary effort is normal. No respiratory distress.     Breath sounds: No stridor. No wheezing, rhonchi or rales.  Chest:     Chest wall: No tenderness.  Musculoskeletal:     Cervical back: Normal range of motion and neck supple.  Lymphadenopathy:     Cervical: No  cervical adenopathy.  Skin:    General: Skin is warm and dry.  Neurological:     General: No focal deficit present.     Mental Status: She is alert and oriented to person, place, and time.  Psychiatric:        Mood and Affect: Mood normal.        Behavior: Behavior normal.     Assessment and Plan :   PDMP not reviewed this encounter.  1. Acute non-recurrent sinusitis of other sinus   2. Type 1 diabetes mellitus without complication [facility])     Deferred imaging given clear cardiopulmonary exam, hemodynamically stable vital signs. Will start empiric treatment for sinusitis with amoxicillin.  Recommended supportive care otherwise. Counseled patient on potential for adverse effects with medications prescribed/recommended today, ER and return-to-clinic precautions discussed, patient verbalized understanding.    Jaynee Eagles, PA-C 06/28/22 1109

## 2022-06-28 NOTE — ED Triage Notes (Signed)
Pt c/o cough x 6 days-NAD-steady gait

## 2022-06-29 ENCOUNTER — Other Ambulatory Visit (HOSPITAL_COMMUNITY): Payer: Self-pay

## 2022-07-01 ENCOUNTER — Other Ambulatory Visit: Payer: Self-pay

## 2022-07-01 ENCOUNTER — Other Ambulatory Visit (HOSPITAL_COMMUNITY): Payer: Self-pay

## 2022-07-03 ENCOUNTER — Other Ambulatory Visit (HOSPITAL_COMMUNITY): Payer: Self-pay

## 2022-07-19 ENCOUNTER — Ambulatory Visit: Payer: Commercial Managed Care - PPO

## 2022-07-31 ENCOUNTER — Other Ambulatory Visit (HOSPITAL_COMMUNITY): Payer: Self-pay

## 2022-08-01 ENCOUNTER — Encounter: Payer: Self-pay | Admitting: Internal Medicine

## 2022-08-01 ENCOUNTER — Ambulatory Visit: Payer: Commercial Managed Care - PPO | Admitting: Internal Medicine

## 2022-08-01 VITALS — BP 120/72 | HR 68 | Ht 68.0 in | Wt 152.8 lb

## 2022-08-01 DIAGNOSIS — E039 Hypothyroidism, unspecified: Secondary | ICD-10-CM

## 2022-08-01 DIAGNOSIS — E1065 Type 1 diabetes mellitus with hyperglycemia: Secondary | ICD-10-CM | POA: Diagnosis not present

## 2022-08-01 LAB — POCT GLYCOSYLATED HEMOGLOBIN (HGB A1C): Hemoglobin A1C: 6.3 % — AB (ref 4.0–5.6)

## 2022-08-01 MED ORDER — NOVOLIN R FLEXPEN RELION 100 UNIT/ML IJ SOPN
PEN_INJECTOR | INTRAMUSCULAR | 3 refills | Status: DC
Start: 2022-08-01 — End: 2023-07-03

## 2022-08-01 NOTE — Patient Instructions (Addendum)
Please read: Maria Evans - Think like a pancreas  Please continue: - Tresiba 11 units at bedtime - Lyumjev 2-6 units 3 times a day before meals + correction  - 1:10 ICR, target 120, ISF 60-65  (- 1:8 ICR with carbs/starches) Try to use a lower Lyumjev dose when you plan to be active after a meal (e.g. ICR of 1:12).  Do not correct hyperglycemia with more than 1 unit. Do not correct hypoglycemia with more than 2 Ronalee Belts and Ike's.  Try to use Regular insulin for dinner.  Please continue Levothyroxine 100 mcg daily.  Take the thyroid hormone every day, with water, at least 30 minutes before breakfast, separated by at least 4 hours from: - acid reflux medications - calcium - iron - multivitamins   Please return in 4-6 months.

## 2022-08-01 NOTE — Progress Notes (Signed)
Patient ID: Maria Evans, female   DOB: [birth year], 57 y.o.   MRN: [id number]   HPI: Maria Evans is a 57 y.o.-year-old very pleasant female, initially referred by her previous endocrinologist, Dr. Garvin Fila. [address] ([place], [place] - moved here end of 2020), returning for follow-up for DM1, diagnosed at 57 y/o, fairly well controlled, without long term complications and also hypothyroidism. Last visit 6 months ago.  Interim history: No increased urination, blurry vision, nausea, chest pain. Her twin brother was just diagnosed with diabetes, also.  DM1:  Reviewed HbA1c levels: Lab Results  Component Value Date   HGBA1C 6.3 (A) 02/11/2022   HGBA1C 6.4 (A) 10/11/2021   HGBA1C 6.9 (A) 06/08/2021   HGBA1C 6.3 (A) 02/13/2021   HGBA1C 6.2 (A) 10/09/2020   HGBA1C 6.3 (A) 06/09/2020   HGBA1C 6.9 (A) 03/09/2020  02/08/2019: HbA1c 7.5%  Previously on Omni pod 2017-fall 2020 but stopped after running out of supplies and had problems with the insertion sites.  She is on: - Tresiba 12 >> 10 >> 11 units at bedtime - Lyumjev 2-6 units 3 times a day before meals + correction >> ends up with ~ 4- max 8 units - 1:10 ICR, target 120, ISF 60-65  - 1:8 ICR with carbs/starches Try to use a lower Lyumjev dose when you plan to be active after a meal (e.g. ICR of 1:12). Recheck sugars with the glucometer when correcting a low. She tried inhaled insulin in the past but she had a persistently severe cough and had to stop. She tried Glimepiride - not very effective.  She was on the freestyle libre CGM but this was not covered by insurance so she is now on a Dexcom CGM. She checks her sugars more than 4 times a day with her CGM:   Previously:    Previously:   Lowest sugar was 20-30s before after starting insulin ... >> 50s >> 41 >> 40s >> 50s; she has hypoglycemia awareness in the 69s. I sent a prescription for a glucagon kit at last visit. No previous hypoglycemia admission. She  may drop her sugars after exercise -usually exercises earlier in the day. Highest sugar was >600 (pump failed) ....>> 401 >> HI >> 300s >. 300s. No previous DKA admissions.  Pt's meals are: - Breakfast: Fruit, peanut butter and banana sandwich, oatmeal with fruit - Lunch: [place] and cheese sandwich + pickle - Dinner: Home-cooked family meals - Snacks: 1 to 2 almonds, cheese, fruit, veggies  -+ History of stage III CKD, resolved after stopping NSAIDs; last BUN/creatinine:  07/27/2021: Glu 115, BUN, Cr 14/0.9, GFR 76 Lab Results  Component Value Date   BUN 10 06/09/2020   Lab Results  Component Value Date   CREATININE 0.75 06/09/2020  Started Lisinopril 2.5 mg daily- 07/2021.  -+ HL; last set of lipids: 07/27/2021: 183/107/83/81 Lab Results  Component Value Date   CHOL 162 06/09/2020   HDL 73.50 06/09/2020   LDLCALC 79 06/09/2020   TRIG 44.0 06/09/2020   CHOLHDL 2 06/09/2020  On Lipitor 20 - moved to every other day.  - last eye exam was in 12/2020: No DR - reportedly.  -No numbness and tingling in her feet.  Last foot exam 02/11/2022.  No FH of DM.  Hypothyroidism:  Pt is on levothyroxine 100 mcg daily (dose decreased 07/2020), taken: - in am << moved from evening - fasting - at least 30 min from b'fast - no calcium - no iron - + multivitamins later  in the day - no PPIs - not on Biotin  Reviewed TSH levels: Lab Results  Component Value Date   TSH 1.89 10/11/2021   TSH 0.73 10/09/2020   TSH 0.27 (L) 07/28/2020   TSH 0.36 06/09/2020  02/09/2019: TSH 0.7474   Pt denies: - feeling nodules in neck - hoarseness - dysphagia - choking She also has a history of anxiety/depression, migraines. She has teeth grinding and jaw clenching. Wears a retainer.  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  She works either early in the day or a later shift from 3 PM to 10 PM.  ROS: + See HPI  I reviewed pt's medications, allergies, PMH, social hx, family hx, and changes were  documented in the history of present illness. Otherwise, unchanged from my initial visit note.  Past Medical History:  Diagnosis Date   Anxiety    Anxiety with depression    Controlled.   Common migraine without aura    Diabetes mellitus type I, controlled ([place])    Treated with 4 times daily insulin based on blood sugar monitoring   Hyperlipidemia due to type 1 diabetes mellitus ([place])    Currently on atorvastatin.   Hypothyroid    Lipoma    MVP (mitral valve prolapse) 2005   Renally diagnosed back in 2005 (during her pregnancy)_, most recently evaluated in 2012.  ([facility] - [facility]iology, Dr. Delman Cheadle); 11/2010 TTE: Moderate MR, posteriorly directed (recommend TEE) => TEE (01/2011) - mild MVP (anterior leaflet) with Mild-Mod MR.;    Past Surgical History:  Procedure Laterality Date   LAPAROSCOPIC LYSIS OF ADHESIONS     LIPOMA EXCISION Left    LUMBAR MICRODISCECTOMY     TRANSESOPHAGEAL ECHOCARDIOGRAM  02/13/2011   [facility] -> Dr. Delman Cheadle [facility]): EF 55 to 60%.  Borderline LA dilation.  Prolapse of A2 scallop of mitral valve-mild MVP with mild to moderate MR.  (2 eccentric jets, posteriorly directed, c/w anterior leaflet pathology))   TRANSTHORACIC ECHOCARDIOGRAM  12/11/2010   [facility] -> Dr. Delman Cheadle [facility]): EF 35 to 6%.  Mild LA dilation.  Moderate MR-eccentric/possibly directed jet consistent with antilipid pathology.  Mild TR, LP HTN, mild PR.  Recommend TEE.   TREADMILL STRESS ECHOCARDIOGRAM     [facility] -> Dr. Delman Cheadle [facility]): Exercised 10 minutes (11 minutes), achieved Max Heart Rate 162 = 92% Max Predicted Heart Rate, clinically and electrocardiographically negative.  Nonspecific ST and T wave changes.  Prestress echo showed EF 55 to 60% with normal wall motion.  Post-Stress: EF 65 to 70% with nl augmentation of all  walls.=> NORMAL STRESS [facility]   Social History   Socioeconomic History   Marital status: Married    Spouse name: Not on file   Number of children: 1   Years of education: Not on file   Highest education level: Not on file  Occupational History   Occupation: Hospital pharmacist     Employer: [employer]  Tobacco Use   Smoking status: Never   Smokeless tobacco: Never  Vaping Use   Vaping Use: Never used  Substance and Sexual Activity   Alcohol use: Yes    Comment: occ, wine or beer   Drug use: Never   Sexual activity: Yes    Birth control/protection: Post-menopausal  Other Topics Concern   Not on file  Social History Narrative   She is currently divorced mother of 7 (27 year old son).   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    She is now with her longtime high school significant other after completing the divorce from her first husband.     She and her fianc intend to elope probably in May or June of this year.      She is an avid exerciser.  Enjoys jogging and walking.  She does not list 2 miles with treadmill and walks about 45 minutes most days - either indoors on the treadmill or outside.         Social Determinants of Health   Financial Resource Strain: Not on file  Food Insecurity: Not on file  Transportation Needs: Not on file  Physical Activity: Not on file  Stress: Not on file  Social Connections: Not on file  Intimate Partner Violence: Not on file   Current Outpatient Medications on File Prior to Visit  Medication Sig Dispense Refill   ALPRAZolam (XANAX) 0.5 MG tablet Take 0.5 mg by mouth as needed for anxiety or sleep.     amoxicillin (AMOXIL) 875 MG tablet Take 1 tablet (875 mg total) by mouth 2 (two) times daily. 14 tablet 0   atorvastatin (LIPITOR) 20 MG tablet Take 1 tablet by mouth every other day 45 tablet  1   Blood Pressure Monitoring (OMRON 3 SERIES BP MONITOR) DEVI Use as directed 1 each 0   Continuous Blood Gluc Receiver (DEXCOM G6 RECEIVER) DEVI Use to check blood sugar 4 times daily 1 each 0   Continuous Blood Gluc Sensor (DEXCOM G6 SENSOR) MISC USE WITH DEXCOM TO CHECK BLOOD SUGAR 4 TIMES DAILY 9 each 3   cyclobenzaprine (FLEXERIL) 10 MG tablet Take 0.5-1 tablets (5-10 mg total) by mouth 3 (three) times daily as needed for muscle spasms. 30 tablet 0   escitalopram (LEXAPRO) 20 MG tablet Take 20 mg by mouth daily.     escitalopram (LEXAPRO) 20 MG tablet Take 1 tablet by mouth daily 90 tablet 1   fluocinonide gel (LIDEX) 0.05 % Apply to affected areas twice daily as needed for flares 30 g 1   fluocinonide gel (LIDEX) 0.05 % Apply Externally Twice a day as needed for flare ups 30 g 1   Glucagon 3 MG/DOSE POWD Place 3 mg into the nose once as needed for up to 1 dose. 1 each 11   GVOKE HYPOPEN 1-PACK 1 MG/0.2ML SOAJ Inject 0.2 mLs into the skin as needed. 0.2 mL PRN   insulin degludec (TRESIBA FLEXTOUCH) 100 UNIT/ML FlexTouch Pen Inject up to 13 Units into the skin daily. 9 mL 3   insulin degludec (TRESIBA) 100 UNIT/ML FlexTouch Pen Inject 12-13 Units into the skin daily.     Insulin Lispro-aabc (LYUMJEV KWIKPEN) 100 UNIT/ML KwikPen Inject up to 8 units before each meal 3-4 times a day 15 mL 11   Insulin Pen Needle (TECHLITE PEN NEEDLES) 32G X 4 MM MISC Use 4 - 6 times daily as directed 300 each 3   levothyroxine (SYNTHROID) 100 MCG tablet Take 1 tablet (100 mcg total) by mouth daily before breakfast. 90 tablet 3   lisinopril (ZESTRIL) 2.5 MG tablet Take 1 tablet by mouth once a day 90 tablet 1   Multiple Vitamin (MULTIVITAMIN) capsule Take 1 capsule by mouth daily.     promethazine-dextromethorphan (PROMETHAZINE-DM) 6.25-15 MG/5ML syrup Take 5 mLs by mouth 3 (three) times daily as needed for  cough. 200 mL 0   pseudoephedrine (SUDAFED) 30 MG tablet Take 1 tablet (30 mg total) by mouth every 8  (eight) hours as needed for congestion. 30 tablet 0   traMADol (ULTRAM) 50 MG tablet Take 1 tablet by mouth once daily as needed 15 tablet 0   valACYclovir (VALTREX) 1000 MG tablet Take 1,000 mg by mouth as needed (fever blisters).     No current facility-administered medications on file prior to visit.   Allergies  Allergen Reactions   Nitrofurantoin Itching and Other (See Comments)   Family History  Problem Relation Age of Onset   Hyperlipidemia Mother    Hyperlipidemia Father    Heart Problems Father    Esophageal cancer Maternal Grandfather        Long-term smoker   CAD Paternal Grandmother        Long-term smoker   Heart attack Paternal Grandmother 58       Cause of death   Stroke Paternal Grandmother     PE: BP 120/72 (BP Location: Left Arm, Patient Position: Sitting, Cuff Size: Normal)   Pulse 68   Ht 5' 8"$  (1.727 m)   Wt 152 lb 12.8 oz (69.3 kg)   LMP 02/07/2016   SpO2 96%   BMI 23.23 kg/m  Wt Readings from Last 3 Encounters:  08/01/22 152 lb 12.8 oz (69.3 kg)  03/20/22 149 lb (67.6 kg)  03/12/22 149 lb (67.6 kg)     Constitutional: normal weight, in NAD Eyes:EOMI, no exophthalmos ENT: no thyromegaly, no cervical lymphadenopathy Cardiovascular: RRR, No MRG Respiratory: CTA B Musculoskeletal: no deformities Skin: no rashes Neurological: no tremor with outstretched hands  ASSESSMENT: 1. DM1, fairly well controlled, without long-term complications, but with hyperglycemia -She has a history of CKD stage III, which resolved after stopping NSAIDs  2. Hypothyroidism  PLAN:  1. Patient with longstanding, fairly well-controlled type 1 diabetes, previously on the OmniPod insulin pump, but now on basal/bolus insulin regimen, after she ran out of her OmniPod supplies.  At last visit, HbA1c was better, at 6.4%.  Reviewing the CGM trends, sugars were increasing more overnight in the previous 2 weeks and she still had lower blood sugars scattered throughout the day.   These usually occurred after correction of higher blood sugars although she was only correcting with 1 to 2 units of insulin.  We did discuss about reducing the amount of correction to 1 unit or less.  I advised her that if she continues to drop her blood sugars after corrections, we may need to switch to the Luxura pen with Lyumjev cartridge, which is able to give her half units.  Another problem was she was over correcting low blood sugars with 4-6 Candace and I advised her to reduce the amount.  Sugars are lower after dinner many times that she was eating these candies with subsequent very high blood sugars throughout the night.  We did not have to change her regimen otherwise.  I did not feel that her highs at night were related to basal insulin, but rather to overcorrection of lows.  We again discussed about the possibility of starting on an insulin pump to allow her better control but she wanted to continue to think about this. CGM interpretation: -At today's visit, we reviewed her CGM downloads: It appears that 70% of values are in target range (goal >70%), while 26% are higher than 180 (goal <25%), and 4% are lower than 70 (goal <4%).  The calculated average blood sugar is  149.  The projected HbA1c for the next 3 months (GMI) is 6.9%. -Reviewing the CGM trends, she has approximately the same pattern as before, with high blood sugars at night after an initial drop after dinner and sugars mostly at goal throughout the day with occasional mild lows. She noticed that her sugars are excellent at night as she is eating whole foods and good quality carbs.  However, if she eats starches like bread/pasta/rice, her sugars increase significantly after she goes to bed at night.  She does not feel that the increase is due to an initial drop in blood sugars after the meal.  She usually needs to wake up 2 hours after she goes to bed to check her blood sugars and to take 2 to 3 units of insulin for correction.  We  discussed that this is absolutely not ideal and to try to come up with different options to help her overnight blood sugars.  One of the options is to inject a longer acting Insulin before dinner (R) or even NPH.  Another option is to switch to an insulin pump.  Of course, there is another option of not eating carbs at night, however, which is less feasible.  For now, she would like to try the regular insulin.  She can continue use up to 8 units of insulin at night before dinner.   - I suggested to: Patient Instructions  Please read: - Idamae Schuller - Think like a pancreas  Please continue: - Tresiba 11 units at bedtime - Lyumjev 2-6 units 3 times a day before meals + correction  - 1:10 ICR, target 120, ISF 60-65  (- 1:8 ICR with carbs/starches) Try to use a lower Lyumjev dose when you plan to be active after a meal (e.g. ICR of 1:12).  Do not correct hyperglycemia with more than 1 unit. Do not correct hypoglycemia with more than 2 Ronalee Belts and Ike's.  Try to use Regular insulin for dinner.  Please continue Levothyroxine 100 mcg daily.  Take the thyroid hormone every day, with water, at least 30 minutes before breakfast, separated by at least 4 hours from: - acid reflux medications - calcium - iron - multivitamins   Please return in 4-6 months.  - we checked her HbA1c: 6.3% (stable) - advised to check sugars at different times of the day - 4x a day, rotating check times - advised for yearly eye exams >> she is UTD - return to clinic in 4-6 months  2. Hypothyroidism - latest thyroid labs reviewed with pt. >> normal: Lab Results  Component Value Date   TSH 1.89 10/11/2021  - she continues on LT4 100 mcg daily - pt feels good on this dose. - we discussed about taking the thyroid hormone every day, with water, >30 minutes before breakfast, separated by >4 hours from acid reflux medications, calcium, iron, multivitamins. Pt. is taking it correctly. - will check thyroid tests today:  TSH and fT4 - If labs are abnormal, she will need to return for repeat TFTs in 1.5 months.    Needs refills LT4 and test strips - Freestyle.  Component     Latest Ref Rng 08/01/2022  Sodium     135 - 145 mEq/L 138   Potassium     3.5 - 5.1 mEq/L 4.3   Chloride     96 - 112 mEq/L 96   CO2     19 - 32 mEq/L 31   Glucose     70 -  99 mg/dL 179 (H)   BUN     6 - 23 mg/dL 18   Creatinine     0.40 - 1.20 mg/dL 0.78   Total Bilirubin     0.2 - 1.2 mg/dL 0.3   Alkaline Phosphatase     39 - 117 U/L 91   AST     0 - 37 U/L 33   ALT     0 - 35 U/L 25   Total Protein     6.0 - 8.3 g/dL 7.5   Albumin     3.5 - 5.2 g/dL 4.5   GFR     >60.00 mL/min 85.70   Calcium     8.4 - 10.5 mg/dL 9.6   Cholesterol     0 - 200 mg/dL 163   Triglycerides     0.0 - 149.0 mg/dL 60.0   HDL Cholesterol     >39.00 mg/dL 69.70   VLDL     0.0 - 40.0 mg/dL 12.0   LDL (calc)     0 - 99 mg/dL 82   Total CHOL/HDL Ratio 2   NonHDL 93.53   TSH     0.35 - 5.50 uIU/mL 2.71   T4,Free(Direct)     0.60 - 1.60 ng/dL 0.93   Labs are at goal. ACR still pending.  Philemon Kingdom, MD PhD [facility]

## 2022-08-02 ENCOUNTER — Other Ambulatory Visit (HOSPITAL_COMMUNITY): Payer: Self-pay

## 2022-08-02 LAB — COMPREHENSIVE METABOLIC PANEL
ALT: 25 U/L (ref 0–35)
AST: 33 U/L (ref 0–37)
Albumin: 4.5 g/dL (ref 3.5–5.2)
Alkaline Phosphatase: 91 U/L (ref 39–117)
BUN: 18 mg/dL (ref 6–23)
CO2: 31 mEq/L (ref 19–32)
Calcium: 9.6 mg/dL (ref 8.4–10.5)
Chloride: 96 mEq/L (ref 96–112)
Creatinine, Ser: 0.78 mg/dL (ref 0.40–1.20)
GFR: 85.7 mL/min (ref >60.00)
Glucose, Bld: 179 mg/dL — ABNORMAL HIGH (ref 70–99)
Potassium: 4.3 mEq/L (ref 3.5–5.1)
Sodium: 138 mEq/L (ref 135–145)
Total Bilirubin: 0.3 mg/dL (ref 0.2–1.2)
Total Protein: 7.5 g/dL (ref 6.0–8.3)

## 2022-08-02 LAB — LIPID PANEL
Cholesterol: 163 mg/dL (ref 0–200)
HDL: 69.7 mg/dL (ref >39.00)
LDL Cholesterol: 82 mg/dL (ref 0–99)
NonHDL: 93.53
Total CHOL/HDL Ratio: 2
Triglycerides: 60 mg/dL (ref 0.0–149.0)
VLDL: 12 mg/dL (ref 0.0–40.0)

## 2022-08-02 LAB — T4, FREE: Free T4: 0.93 ng/dL (ref 0.60–1.60)

## 2022-08-02 LAB — MICROALBUMIN / CREATININE URINE RATIO
Creatinine,U: 30.3 mg/dL
Microalb Creat Ratio: 2.3 mg/g (ref 0.0–30.0)
Microalb, Ur: <0.7 mg/dL (ref 0.0–1.9)

## 2022-08-02 LAB — TSH: TSH: 2.71 u[IU]/mL (ref 0.35–5.50)

## 2022-08-02 MED ORDER — FREESTYLE TEST VI STRP
ORAL_STRIP | 12 refills | Status: DC
  Filled 2022-08-02: qty 100, 50d supply, fill #0
  Filled 2023-03-03: qty 100, 50d supply, fill #1

## 2022-08-02 MED ORDER — LEVOTHYROXINE SODIUM 100 MCG PO TABS
100.0000 ug | ORAL_TABLET | Freq: Every day | ORAL | 3 refills | Status: DC
Start: 2022-08-02 — End: 2023-07-04
  Filled 2022-08-02: qty 90, 90d supply, fill #0
  Filled 2022-10-29: qty 90, 90d supply, fill #1
  Filled 2023-02-01: qty 90, 90d supply, fill #2
  Filled 2023-04-29: qty 90, 90d supply, fill #3

## 2022-08-03 ENCOUNTER — Other Ambulatory Visit (HOSPITAL_COMMUNITY): Payer: Self-pay

## 2022-08-03 MED ORDER — ESCITALOPRAM OXALATE 20 MG PO TABS
20.0000 mg | ORAL_TABLET | Freq: Every day | ORAL | 0 refills | Status: DC
  Filled 2022-08-03: qty 90, 90d supply, fill #0

## 2022-08-05 ENCOUNTER — Other Ambulatory Visit: Payer: Self-pay

## 2022-08-05 ENCOUNTER — Other Ambulatory Visit (HOSPITAL_COMMUNITY): Payer: Self-pay

## 2022-08-05 MED ORDER — LEVOCETIRIZINE DIHYDROCHLORIDE 5 MG PO TABS
5.0000 mg | ORAL_TABLET | Freq: Every evening | ORAL | 1 refills | Status: DC
  Filled 2022-08-05: qty 30, 30d supply, fill #0
  Filled 2022-09-03: qty 30, 30d supply, fill #1

## 2022-08-06 ENCOUNTER — Other Ambulatory Visit: Payer: Self-pay

## 2022-08-08 ENCOUNTER — Other Ambulatory Visit (HOSPITAL_COMMUNITY): Payer: Self-pay

## 2022-08-10 ENCOUNTER — Other Ambulatory Visit (HOSPITAL_COMMUNITY): Payer: Self-pay

## 2022-08-13 ENCOUNTER — Other Ambulatory Visit (HOSPITAL_COMMUNITY): Payer: Self-pay

## 2022-08-14 DIAGNOSIS — E039 Hypothyroidism, unspecified: Secondary | ICD-10-CM | POA: Diagnosis not present

## 2022-08-14 DIAGNOSIS — E139 Other specified diabetes mellitus without complications: Secondary | ICD-10-CM | POA: Diagnosis not present

## 2022-08-14 DIAGNOSIS — M8580 Other specified disorders of bone density and structure, unspecified site: Secondary | ICD-10-CM | POA: Diagnosis not present

## 2022-08-14 DIAGNOSIS — H029 Unspecified disorder of eyelid: Secondary | ICD-10-CM | POA: Diagnosis not present

## 2022-08-14 DIAGNOSIS — Z0000 Encounter for general adult medical examination without abnormal findings: Secondary | ICD-10-CM | POA: Diagnosis not present

## 2022-08-14 DIAGNOSIS — Z7185 Encounter for immunization safety counseling: Secondary | ICD-10-CM | POA: Diagnosis not present

## 2022-08-14 DIAGNOSIS — F419 Anxiety disorder, unspecified: Secondary | ICD-10-CM | POA: Diagnosis not present

## 2022-08-14 DIAGNOSIS — M545 Low back pain, unspecified: Secondary | ICD-10-CM | POA: Diagnosis not present

## 2022-08-14 DIAGNOSIS — K1379 Other lesions of oral mucosa: Secondary | ICD-10-CM | POA: Diagnosis not present

## 2022-08-15 ENCOUNTER — Encounter: Payer: Self-pay | Admitting: Internal Medicine

## 2022-08-15 ENCOUNTER — Other Ambulatory Visit: Payer: Self-pay | Admitting: Internal Medicine

## 2022-08-15 ENCOUNTER — Other Ambulatory Visit (HOSPITAL_COMMUNITY): Payer: Self-pay

## 2022-08-15 DIAGNOSIS — E1065 Type 1 diabetes mellitus with hyperglycemia: Secondary | ICD-10-CM

## 2022-08-15 MED ORDER — ESCITALOPRAM OXALATE 20 MG PO TABS
20.0000 mg | ORAL_TABLET | Freq: Every day | ORAL | 3 refills | Status: DC
  Filled 2022-10-29: qty 90, 90d supply, fill #0
  Filled 2023-02-01: qty 90, 90d supply, fill #1
  Filled 2023-04-29: qty 90, 90d supply, fill #2
  Filled 2023-07-28: qty 90, 90d supply, fill #3

## 2022-08-15 MED ORDER — CYCLOBENZAPRINE HCL 10 MG PO TABS
10.0000 mg | ORAL_TABLET | Freq: Every evening | ORAL | 0 refills | Status: DC | PRN
  Filled 2022-08-15: qty 30, 30d supply, fill #0

## 2022-08-15 MED ORDER — ATORVASTATIN CALCIUM 20 MG PO TABS
20.0000 mg | ORAL_TABLET | ORAL | 1 refills | Status: DC
  Filled 2022-08-15: qty 45, 90d supply, fill #0
  Filled 2022-12-31 – 2023-01-01 (×2): qty 45, 90d supply, fill #1
  Filled 2023-04-08: qty 45, 90d supply, fill #2
  Filled 2023-07-15: qty 45, 90d supply, fill #3

## 2022-08-15 MED ORDER — DEXCOM G6 TRANSMITTER MISC
3 refills | Status: DC
  Filled 2022-08-15: qty 1, 90d supply, fill #0
  Filled 2022-11-15 (×2): qty 1, 90d supply, fill #1
  Filled 2023-02-28: qty 1, 90d supply, fill #2
  Filled 2023-06-16: qty 1, 90d supply, fill #3

## 2022-08-15 MED ORDER — ALPRAZOLAM 0.5 MG PO TABS
0.5000 mg | ORAL_TABLET | Freq: Every day | ORAL | 0 refills | Status: AC | PRN
  Filled 2022-08-15: qty 30, 30d supply, fill #0

## 2022-08-15 MED ORDER — LISINOPRIL 2.5 MG PO TABS
2.5000 mg | ORAL_TABLET | Freq: Every day | ORAL | 1 refills | Status: DC
  Filled 2022-08-15: qty 90, 90d supply, fill #0
  Filled 2022-11-15 (×2): qty 90, 90d supply, fill #1

## 2022-08-15 MED ORDER — TRAMADOL HCL 50 MG PO TABS: 50.0000 mg | ORAL_TABLET | Freq: Every day | ORAL | 0 refills | Status: DC | PRN

## 2022-08-20 ENCOUNTER — Other Ambulatory Visit: Payer: Self-pay

## 2022-09-10 ENCOUNTER — Other Ambulatory Visit: Payer: Self-pay

## 2022-09-14 ENCOUNTER — Other Ambulatory Visit (HOSPITAL_COMMUNITY): Payer: Self-pay

## 2022-09-27 ENCOUNTER — Other Ambulatory Visit (HOSPITAL_COMMUNITY): Payer: Self-pay

## 2022-09-30 ENCOUNTER — Other Ambulatory Visit (HOSPITAL_COMMUNITY): Payer: Self-pay

## 2022-09-30 ENCOUNTER — Other Ambulatory Visit: Payer: Self-pay

## 2022-09-30 MED ORDER — LEVOCETIRIZINE DIHYDROCHLORIDE 5 MG PO TABS
5.0000 mg | ORAL_TABLET | Freq: Every evening | ORAL | 1 refills | Status: DC
  Filled 2022-09-30: qty 30, 30d supply, fill #0
  Filled 2022-10-29: qty 30, 30d supply, fill #1

## 2022-10-10 DIAGNOSIS — K1379 Other lesions of oral mucosa: Secondary | ICD-10-CM | POA: Diagnosis not present

## 2022-10-16 ENCOUNTER — Other Ambulatory Visit (HOSPITAL_COMMUNITY): Payer: Self-pay

## 2022-10-17 ENCOUNTER — Other Ambulatory Visit (HOSPITAL_COMMUNITY): Payer: Self-pay

## 2022-10-29 ENCOUNTER — Other Ambulatory Visit: Payer: Self-pay

## 2022-10-29 ENCOUNTER — Other Ambulatory Visit (HOSPITAL_COMMUNITY): Payer: Self-pay

## 2022-11-04 ENCOUNTER — Other Ambulatory Visit (HOSPITAL_COMMUNITY): Payer: Self-pay

## 2022-11-06 DIAGNOSIS — L720 Epidermal cyst: Secondary | ICD-10-CM | POA: Diagnosis not present

## 2022-11-06 DIAGNOSIS — M7130 Other bursal cyst, unspecified site: Secondary | ICD-10-CM | POA: Diagnosis not present

## 2022-11-15 ENCOUNTER — Other Ambulatory Visit (HOSPITAL_COMMUNITY): Payer: Self-pay

## 2022-11-15 ENCOUNTER — Other Ambulatory Visit: Payer: Self-pay

## 2022-11-21 ENCOUNTER — Other Ambulatory Visit (HOSPITAL_COMMUNITY): Payer: Self-pay

## 2022-11-25 ENCOUNTER — Other Ambulatory Visit: Payer: Self-pay | Admitting: Internal Medicine

## 2022-11-25 ENCOUNTER — Other Ambulatory Visit (HOSPITAL_BASED_OUTPATIENT_CLINIC_OR_DEPARTMENT_OTHER): Payer: Self-pay

## 2022-11-25 ENCOUNTER — Other Ambulatory Visit (HOSPITAL_COMMUNITY): Payer: Self-pay

## 2022-11-25 MED ORDER — LYUMJEV KWIKPEN 100 UNIT/ML ~~LOC~~ SOPN
PEN_INJECTOR | SUBCUTANEOUS | 11 refills | Status: DC
  Filled 2022-11-25: qty 15, 47d supply, fill #0
  Filled 2023-01-14 – 2023-01-15 (×3): qty 15, 47d supply, fill #1
  Filled 2023-03-03: qty 15, 47d supply, fill #2
  Filled 2023-05-19: qty 15, 47d supply, fill #3
  Filled 2023-08-14: qty 15, 47d supply, fill #4
  Filled 2023-09-30: qty 15, 47d supply, fill #5

## 2022-11-26 ENCOUNTER — Other Ambulatory Visit: Payer: Self-pay

## 2022-11-26 ENCOUNTER — Other Ambulatory Visit (HOSPITAL_COMMUNITY): Payer: Self-pay

## 2022-11-26 MED ORDER — LEVOCETIRIZINE DIHYDROCHLORIDE 5 MG PO TABS
5.0000 mg | ORAL_TABLET | Freq: Every evening | ORAL | 1 refills | Status: DC
  Filled 2022-11-28: qty 30, 30d supply, fill #0
  Filled 2022-12-31 – 2023-01-01 (×2): qty 30, 30d supply, fill #1

## 2022-11-28 ENCOUNTER — Other Ambulatory Visit (HOSPITAL_COMMUNITY): Payer: Self-pay

## 2022-12-11 ENCOUNTER — Other Ambulatory Visit: Payer: Self-pay

## 2022-12-13 ENCOUNTER — Other Ambulatory Visit (HOSPITAL_COMMUNITY): Payer: Self-pay

## 2022-12-24 ENCOUNTER — Ambulatory Visit: Payer: Commercial Managed Care - PPO | Admitting: Internal Medicine

## 2022-12-24 ENCOUNTER — Encounter: Payer: Self-pay | Admitting: Internal Medicine

## 2022-12-24 VITALS — BP 120/70 | HR 95 | Ht 68.0 in | Wt 157.6 lb

## 2022-12-24 DIAGNOSIS — E039 Hypothyroidism, unspecified: Secondary | ICD-10-CM

## 2022-12-24 DIAGNOSIS — E1065 Type 1 diabetes mellitus with hyperglycemia: Secondary | ICD-10-CM

## 2022-12-24 DIAGNOSIS — E119 Type 2 diabetes mellitus without complications: Secondary | ICD-10-CM

## 2022-12-24 LAB — HEMOGLOBIN A1C: Hemoglobin A1C: 6.9

## 2022-12-24 NOTE — Progress Notes (Signed)
Patient ID: Maria Evans, female   DOB: 10-30-1965, 57 y.o.   MRN: [id number]   HPI: Maria Evans is a 57 y.o.-year-old very pleasant female, initially referred by her previous endocrinologist, Dr. Jearld Lesch. [address] ([place], [place] - moved here end of 2020), returning for follow-up for DM1, diagnosed at 57 y/o, fairly well controlled, without long term complications and also hypothyroidism. Last visit 5 months ago.  Interim history: No increased urination, blurry vision, nausea, chest pain. She gained weight and feels she relaxed her diet.  DM1:  Reviewed HbA1c levels: Lab Results  Component Value Date   HGBA1C 6.3 (A) 08/01/2022   HGBA1C 6.3 (A) 02/11/2022   HGBA1C 6.4 (A) 10/11/2021   HGBA1C 6.9 (A) 06/08/2021   HGBA1C 6.3 (A) 02/13/2021   HGBA1C 6.2 (A) 10/09/2020   HGBA1C 6.3 (A) 06/09/2020   HGBA1C 6.9 (A) 03/09/2020  02/08/2019: HbA1c 7.5%  Previously on Omni pod 2017-fall 2020 but stopped after running out of supplies and had problems with the insertion sites. She tried Afrezza >> chronic cough - but she liked it.  She is on: - Tresiba 12 >> 10 >> 11 units at bedtime - Lyumjev 2-6 units 3 times a day before meals + correction >> ends up with ~ 4- max 8 units - 1:10 ICR, target 120, ISF 60-65  - 1:8 ICR with carbs/starches Try to use a lower Lyumjev dose when you plan to be active after a meal (e.g. ICR of 1:12). Recheck sugars with the glucometer when correcting a low. She tried inhaled insulin in the past but she had a persistently severe cough and had to stop. She tried Glimepiride - not very effective.  She was on the freestyle libre CGM but this was not covered by insurance so she is now on a Dexcom CGM. She checks her sugars more than 4 times a day with her CGM -she would not want this to be downloaded today.  Per her report: - am: ? - 2h after b'fast: ? - lunch: ? - 2h after lunch: ? - dinner:? - 2h after dinner: 94-154 - bedtime:  210-300 - nighttime: 200s  Previously:  Previously:   Lowest sugar was 20-30s before after starting insulin .Marland Kitchen.40s >> 50s >> stacking: 45; she has hypoglycemia awareness in the 70s. I sent a prescription for a glucagon kit at last visit. No previous hypoglycemia admission. She may drop her sugars after exercise -usually exercises earlier in the day. Highest sugar was >600 (pump failed) ... >> HI >> 300s >> 300s >> HI (b'day party). No previous DKA admissions.  Pt's meals are: - Breakfast: Fruit, peanut butter and banana sandwich, oatmeal with fruit - Lunch: [place] and cheese sandwich + pickle - Dinner: Home-cooked family meals - Snacks: 1 to 2 almonds, cheese, fruit, veggies  -+ History of stage III CKD, resolved after stopping NSAIDs; last BUN/creatinine:  Lab Results  Component Value Date   BUN 18 08/01/2022   Lab Results  Component Value Date   CREATININE 0.78 08/01/2022  Started Lisinopril 2.5 mg daily- 07/2021.  -+ HL; last set of lipids: Lab Results  Component Value Date   CHOL 163 08/01/2022   HDL 69.70 08/01/2022   LDLCALC 82 08/01/2022   TRIG 60.0 08/01/2022   CHOLHDL 2 08/01/2022  On Lipitor 20 - moved to every other day.  - last eye exam was in 082023: No DR - reportedly.  -No numbness and tingling in her feet.  Last foot exam  02/11/2022.  + FH of DM. Her twin brother was diagnosed with diabetes, also, in 2023.  Hypothyroidism:  Pt is on levothyroxine 100 mcg daily (dose decreased 07/2020), taken: - in am << moved from evening - fasting - at least 30 min from b'fast - no calcium - no iron - + multivitamins later in the day - no PPIs - not on Biotin  Reviewed TSH levels: Lab Results  Component Value Date   TSH 2.71 08/01/2022   TSH 1.89 10/11/2021   TSH 0.73 10/09/2020   TSH 0.27 (L) 07/28/2020   TSH 0.36 06/09/2020  02/09/2019: TSH 0.7474   Pt denies: - feeling nodules in neck - hoarseness - dysphagia - choking She also has a history of  anxiety/depression, migraines. She has teeth grinding and jaw clenching. Wears a retainer.  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  She works either early in the day or a later shift from 3 PM to 10 PM.  ROS: + See HPI  I reviewed pt's medications, allergies, PMH, social hx, family hx, and changes were documented in the history of present illness. Otherwise, unchanged from my initial visit note.  Past Medical History:  Diagnosis Date   Anxiety    Anxiety with depression    Controlled.   Common migraine without aura    Diabetes mellitus type I, controlled (HCC)    Treated with 4 times daily insulin based on blood sugar monitoring   Hyperlipidemia due to type 1 diabetes mellitus (HCC)    Currently on atorvastatin.   Hypothyroid    Lipoma    MVP (mitral valve prolapse) 2005   Renally diagnosed back in 2005 (during her pregnancy)_, most recently evaluated in 2012.  [facility] - Cardiology, Dr. Emily Filbert); 11/2010 TTE: Moderate MR, posteriorly directed (recommend TEE) => TEE (01/2011) - mild MVP (anterior leaflet) with Mild-Mod MR.;    Past Surgical History:  Procedure Laterality Date   LAPAROSCOPIC LYSIS OF ADHESIONS     LIPOMA EXCISION Left    LUMBAR MICRODISCECTOMY     TRANSESOPHAGEAL ECHOCARDIOGRAM  02/13/2011   [facility] -> Dr. Emily Filbert [facility]Cardiology): EF 55 to 60%.  Borderline LA dilation.  Prolapse of A2 scallop of mitral valve-mild MVP with mild to moderate MR.  (2 eccentric jets, posteriorly directed, c/w anterior leaflet pathology))   TRANSTHORACIC ECHOCARDIOGRAM  12/11/2010   [facility] -> Dr. Emily Filbert [facility]-Cardiology): EF 35 to 6%.  Mild LA dilation.  Moderate MR-eccentric/possibly directed jet consistent with antilipid pathology.  Mild TR, LP HTN, mild PR.  Recommend TEE.   TREADMILL STRESS ECHOCARDIOGRAM     [facility] -> Dr. Emily Filbert [facility]-Cardiology): Exercised 10 minutes (11 minutes), achieved Max Heart Rate 162 = 92% Max Predicted Heart Rate, clinically and electrocardiographically negative.  Nonspecific ST and T wave changes.  Prestress echo showed EF 55 to 60% with normal wall motion.  Post-Stress: EF 65 to 70% with nl augmentation of all walls.=> NORMAL STRESS [facility]   Social History   Socioeconomic History   Marital status: Married    Spouse name: Not on file   Number of children: 1   Years of education: Not on file   Highest education level: Not on file  Occupational History   Occupation: Hospital pharmacist     Employer: [employer]  Tobacco Use   Smoking status: Never   Smokeless tobacco: Never  Vaping Use   Vaping Use: Never used  Substance  and Sexual Activity   Alcohol use: Yes    Comment: occ, wine or beer   Drug use: Never   Sexual activity: Yes    Birth control/protection: Post-menopausal  Other Topics Concern   Not on file  Social History Narrative   She is currently divorced mother of 67 (48 year old son).   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    She is now with her longtime high school significant other after completing the divorce from her first husband.     She and her fianc intend to elope probably in May or June of this year.      She is an avid exerciser.  Enjoys jogging and walking.  She does not list 2 miles with treadmill and walks about 45 minutes most days - either indoors on the treadmill or outside.         Social Determinants of Health   Financial Resource Strain: Not on file  Food Insecurity: Not on file  Transportation Needs: Not on file  Physical Activity: Not on file  Stress: Not on file  Social Connections: Not on file  Intimate Partner Violence: Not on file   Current Outpatient Medications on File Prior to Visit   Medication Sig Dispense Refill   ALPRAZolam (XANAX) 0.5 MG tablet Take 0.5 mg by mouth as needed for anxiety or sleep.     ALPRAZolam (XANAX) 0.5 MG tablet Take 1 tablet (0.5 mg total) by mouth daily as needed. 30 tablet 0   amoxicillin (AMOXIL) 875 MG tablet Take 1 tablet (875 mg total) by mouth 2 (two) times daily. 14 tablet 0   atorvastatin (LIPITOR) 20 MG tablet Take 1 tablet by mouth every other day 45 tablet 1   atorvastatin (LIPITOR) 20 MG tablet Take 1 tablet (20 mg total) by mouth every other day. 90 tablet 1   Blood Pressure Monitoring (OMRON 3 SERIES BP MONITOR) DEVI Use as directed 1 each 0   Continuous Blood Gluc Receiver (DEXCOM G6 RECEIVER) DEVI Use to check blood sugar 4 times daily 1 each 0   Continuous Glucose Sensor (DEXCOM G6 SENSOR) MISC USE WITH DEXCOM TO CHECK BLOOD SUGAR 4 TIMES DAILY 9 each 3   Continuous Glucose Transmitter (DEXCOM G6 TRANSMITTER) MISC Use as instructed to check blood sugar change every 90 days 1 each 3   cyclobenzaprine (FLEXERIL) 10 MG tablet Take 0.5-1 tablets (5-10 mg total) by mouth 3 (three) times daily as needed for muscle spasms. 30 tablet 0   cyclobenzaprine (FLEXERIL) 10 MG tablet Take 1 tablet (10 mg total) by mouth at bedtime as needed. 30 tablet 0   escitalopram (LEXAPRO) 20 MG tablet Take 1 tablet (20 mg total) by mouth daily. 90 tablet 3   fluocinonide gel (LIDEX) 0.05 % Apply to affected areas twice daily as needed for flares 30 g 1   fluocinonide gel (LIDEX) 0.05 % Apply Externally Twice a day as needed for flare ups 30 g 1   Glucagon 3 MG/DOSE POWD Place 3 mg into the nose once as needed for up to 1 dose. 1 each 11   glucose blood (FREESTYLE TEST STRIPS) test strip Use as instructed 1-2 times a day 100 each 12   GVOKE HYPOPEN 1-PACK 1 MG/0.2ML SOAJ Inject 0.2 mLs into the skin as needed. 0.2 mL PRN   insulin degludec (TRESIBA FLEXTOUCH)  100 UNIT/ML FlexTouch Pen Inject up to 13 Units into the skin daily. 9 mL 3   Insulin Lispro-aabc  (LYUMJEV KWIKPEN) 100 UNIT/ML KwikPen Inject up to 8 units before each meal 3-4 times a day 15 mL 11   Insulin Pen Needle (TECHLITE PEN NEEDLES) 32G X 4 MM MISC Use 4 - 6 times daily as directed 300 each 3   Insulin Regular Human (NOVOLIN R FLEXPEN RELION) 100 UNIT/ML KwikPen Use 6-8 units under skin 1x a day before dinner 3 mL 3   levocetirizine (XYZAL) 5 MG tablet Take 1 tablet (5 mg total) by mouth every evening. 30 tablet 1   levothyroxine (SYNTHROID) 100 MCG tablet Take 1 tablet (100 mcg total) by mouth daily before breakfast. 90 tablet 3   lisinopril (ZESTRIL) 2.5 MG tablet Take 1 tablet (2.5 mg total) by mouth daily. 90 tablet 1   Multiple Vitamin (MULTIVITAMIN) capsule Take 1 capsule by mouth daily.     promethazine-dextromethorphan (PROMETHAZINE-DM) 6.25-15 MG/5ML syrup Take 5 mLs by mouth 3 (three) times daily as needed for cough. 200 mL 0   pseudoephedrine (SUDAFED) 30 MG tablet Take 1 tablet (30 mg total) by mouth every 8 (eight) hours as needed for congestion. 30 tablet 0   traMADol (ULTRAM) 50 MG tablet Take 1 tablet by mouth once daily as needed 15 tablet 0   traMADol (ULTRAM) 50 MG tablet Take 1 tablet (50 mg total) by mouth daily as needed. 15 tablet 0   valACYclovir (VALTREX) 1000 MG tablet Take 1,000 mg by mouth as needed (fever blisters).     No current facility-administered medications on file prior to visit.   Allergies  Allergen Reactions   Nitrofurantoin Itching and Other (See Comments)   Family History  Problem Relation Age of Onset   Hyperlipidemia Mother    Hyperlipidemia Father    Heart Problems Father    Esophageal cancer Maternal Grandfather        Long-term smoker   CAD Paternal Grandmother        Long-term smoker   Heart attack Paternal Grandmother 41       Cause of death   Stroke Paternal Grandmother    PE: BP 120/70   Pulse 95   Ht 5\' 8"  (1.727 m)   Wt 157 lb 9.6 oz (71.5 kg)   LMP 02/07/2016   SpO2 97%   BMI 23.96 kg/m  Wt Readings from Last  3 Encounters:  12/24/22 157 lb 9.6 oz (71.5 kg)  08/01/22 152 lb 12.8 oz (69.3 kg)  03/20/22 149 lb (67.6 kg)     Constitutional: normal weight, in NAD Eyes:EOMI, no exophthalmos ENT: no thyromegaly, no cervical lymphadenopathy Cardiovascular: RRR, No MRG Respiratory: CTA B Musculoskeletal: no deformities Skin: no rashes Neurological: no tremor with outstretched hands Diabetic Foot Exam - Simple   Simple Foot Form Diabetic Foot exam was performed with the following findings: Yes 12/24/2022  4:24 PM  Visual Inspection No deformities, no ulcerations, no other skin breakdown bilaterally: Yes Sensation Testing Intact to touch and monofilament testing bilaterally: Yes Pulse Check Posterior Tibialis and Dorsalis pulse intact bilaterally: Yes Comments    ASSESSMENT: 1. DM1, fairly well controlled, without long-term complications, but with hyperglycemia -She has a history of CKD stage III, which resolved after stopping NSAIDs  2. Hypothyroidism  PLAN:  1. Patient with longstanding, fairly well-controlled type 1 diabetes, previously on the OmniPod insulin pump, but now on basal bolus insulin regimen, after she ran out of her OmniPod supplies.  She would like to stay on multiple daily injections for now.  At last visit, reviewing the CGM trends, she had approximately the same pattern as before, with high blood sugars at night after an initial drop after dinner, and sugars mostly at goal throughout the day with occasional mild lows.  Before last visit, she noticed that when she was eating whole foods with quality carbs at night, sugars are excellent.  However, if she was eating starches like bread/pasta/rice, sugars were increasing significantly after she was going to bed.  She did not feel that the increase was due to an initial drop in blood sugars after the meal.  She had to wake up 2 hours after she was going to bed to check her blood sugars and to take 2 to 3 units of insulin for correction.   We discussed that this was not ideal and discussed about the possibility of using longer acting insulin before dinner, R, or even NPH.  Another option would have been to switch to an insulin pump.  There is also the option of not eating carbs at night, which was less feasible.  She decided to try the regular insulin and I advised her to continue with up to 80 units.  HbA1c at last visit was stable, at goal, at 6.3%. -At today's visit, patient would not want me to download her CGM, due to the cost that she has to pay for the interpretation.  We discussed that it is difficult for me to suggest any changes in her regimen without seeing her blood sugars.  She tells me that they are similar to before, but increasing her low bit more at night, in the 200s and sometimes 300s and staying elevated throughout the night after she eats more processed carbs at night or if she eats out, despite good blood sugars right after dinner. -We focused the appointment on the meals that can cause high blood sugars: popcorn, check mix, and almost anything if she eats out - discussed about hidden carbs in ketchup, and also the different glycemic index of different breads and grains. -She tried regular insulin before dinner and this did not make a big difference in her blood sugars after she goes to bed. She sometimes needs to wake up in her first sleep to correct high blood sugars, by using 2 units of Lyumjev.  -At today's visit, we also discussed about different options: Using an insulin pump that delivers only basal insulin.  Overall, she would not want to be on another insulin pump right now, that she enjoys the flexibility given by the injections.  In that case, I suggested to try NPH intermediate insulin before dinner.  She can start with few units and increase as needed.  She is taking [place] at night and I advised her to move into the morning but she would prefer to keep it at night.  In that case, she could possibly use both  Tresiba and NPH at night but with very close attention to her blood sugars.  She agrees to try this.  For now, we will continue the same dose of Lyumjev.  She would be interested in Afrezza for correction but she had cough with this in the past so I did not recommend this. - I suggested to: Patient Instructions  Please continue: - Tresiba 11 units at bedtime - Lyumjev 2-6 units 3 times a day before meals + correction  - 1:10 ICR, target 120, ISF 60-65  (- 1:8 ICR with  carbs/starches) Try to use a lower Lyumjev dose when you plan to be active after a meal (e.g. ICR of 1:12).  Do not correct hyperglycemia with more than 1 unit. Do not correct hypoglycemia with more than 2 Kathlene November and Ike's.  Think about using NPH before dinner.  Please continue Levothyroxine 100 mcg daily.  Take the thyroid hormone every day, with water, at least 30 minutes before breakfast, separated by at least 4 hours from: - acid reflux medications - calcium - iron - multivitamins  Please return in 4-6 months.  - we checked her HbA1c: 6.9% (higher) - advised to check sugars at different times of the day - 4x a day, rotating check times - advised for yearly eye exams >> she is UTD - return to clinic in 6 months  2. Hypothyroidism - latest thyroid labs reviewed with pt. >> normal: Lab Results  Component Value Date   TSH 2.71 08/01/2022  - she continues on LT4 100 mcg daily - pt feels good on this dose. - we discussed about taking the thyroid hormone every day, with water, >30 minutes before breakfast, separated by >4 hours from acid reflux medications, calcium, iron, multivitamins. Pt. is taking it correctly.  Total time spent for the visit: 40 min, in precharting, reviewing her blood sugar control, discussing her hypo- and hyper-glycemic episodes, reviewing her diet and different strategies to reduce her hyperglycemic excursions and also developing a plan to avoid hypo- and hyper-glycemia.   Carlus Pavlov,  MD PhD [facility]

## 2022-12-24 NOTE — Patient Instructions (Addendum)
Please continue: - Tresiba 11 units at bedtime - Lyumjev 2-6 units 3 times a day before meals + correction  - 1:10 ICR, target 120, ISF 60-65  (- 1:8 ICR with carbs/starches) Try to use a lower Lyumjev dose when you plan to be active after a meal (e.g. ICR of 1:12).  Do not correct hyperglycemia with more than 1 unit. Do not correct hypoglycemia with more than 2 Kathlene November and Ike's.  Think about using NPH before dinner.  Please continue Levothyroxine 100 mcg daily.  Take the thyroid hormone every day, with water, at least 30 minutes before breakfast, separated by at least 4 hours from: - acid reflux medications - calcium - iron - multivitamins  Please return in 4-6 months.

## 2022-12-31 ENCOUNTER — Other Ambulatory Visit: Payer: Self-pay

## 2022-12-31 ENCOUNTER — Encounter: Payer: Self-pay | Admitting: Pharmacist

## 2023-01-01 ENCOUNTER — Other Ambulatory Visit: Payer: Self-pay | Admitting: Family Medicine

## 2023-01-01 ENCOUNTER — Other Ambulatory Visit (HOSPITAL_COMMUNITY): Payer: Self-pay

## 2023-01-01 DIAGNOSIS — Z1231 Encounter for screening mammogram for malignant neoplasm of breast: Secondary | ICD-10-CM

## 2023-01-02 ENCOUNTER — Other Ambulatory Visit: Payer: Self-pay | Admitting: Oncology

## 2023-01-02 ENCOUNTER — Other Ambulatory Visit: Payer: Self-pay

## 2023-01-02 DIAGNOSIS — Z006 Encounter for examination for normal comparison and control in clinical research program: Secondary | ICD-10-CM

## 2023-01-14 ENCOUNTER — Other Ambulatory Visit (HOSPITAL_COMMUNITY): Payer: Self-pay

## 2023-01-14 ENCOUNTER — Other Ambulatory Visit: Payer: Self-pay

## 2023-01-14 ENCOUNTER — Encounter: Payer: Self-pay | Admitting: Internal Medicine

## 2023-01-14 DIAGNOSIS — E1065 Type 1 diabetes mellitus with hyperglycemia: Secondary | ICD-10-CM

## 2023-01-14 MED ORDER — ULTICARE MICRO PEN NEEDLES 31G X 8 MM MISC
4.0000 [IU] | Freq: Every day | 1 refills | Status: DC
  Filled 2023-01-14: qty 100, fill #0
  Filled 2023-01-20: qty 100, 90d supply, fill #0
  Filled 2023-03-12: qty 100, 90d supply, fill #1

## 2023-01-15 ENCOUNTER — Other Ambulatory Visit (HOSPITAL_COMMUNITY): Payer: Self-pay

## 2023-01-15 ENCOUNTER — Other Ambulatory Visit: Payer: Self-pay

## 2023-01-15 DIAGNOSIS — E1065 Type 1 diabetes mellitus with hyperglycemia: Secondary | ICD-10-CM

## 2023-01-15 MED ORDER — INSULIN SYRINGE-NEEDLE U-100 31G X 5/16" 0.3 ML MISC
4.0000 [IU] | Freq: Every day | 0 refills | Status: DC
Start: 2023-01-15 — End: 2023-07-03
  Filled 2023-01-15: qty 100, fill #0
  Filled 2023-01-20: qty 100, 90d supply, fill #0

## 2023-01-17 ENCOUNTER — Other Ambulatory Visit (HOSPITAL_COMMUNITY): Payer: Self-pay

## 2023-01-20 ENCOUNTER — Other Ambulatory Visit: Payer: Self-pay

## 2023-01-20 ENCOUNTER — Other Ambulatory Visit (HOSPITAL_COMMUNITY): Payer: Self-pay

## 2023-01-21 ENCOUNTER — Other Ambulatory Visit (HOSPITAL_COMMUNITY): Payer: Self-pay

## 2023-01-29 ENCOUNTER — Other Ambulatory Visit (HOSPITAL_COMMUNITY)
Admission: RE | Admit: 2023-01-29 | Discharge: 2023-01-29 | Disposition: A | Discharge status: Home / Self Care | Payer: Commercial Managed Care - PPO | Attending: Oncology | Admitting: Oncology

## 2023-01-29 DIAGNOSIS — Z006 Encounter for examination for normal comparison and control in clinical research program: Secondary | ICD-10-CM | POA: Insufficient documentation

## 2023-02-01 ENCOUNTER — Other Ambulatory Visit (HOSPITAL_COMMUNITY): Payer: Self-pay

## 2023-02-02 ENCOUNTER — Other Ambulatory Visit: Payer: Self-pay

## 2023-02-03 ENCOUNTER — Other Ambulatory Visit (HOSPITAL_COMMUNITY): Payer: Self-pay

## 2023-02-03 MED ORDER — LEVOCETIRIZINE DIHYDROCHLORIDE 5 MG PO TABS
5.0000 mg | ORAL_TABLET | Freq: Every evening | ORAL | 1 refills | Status: DC
  Filled 2023-02-03: qty 30, 30d supply, fill #0
  Filled 2023-02-28: qty 30, 30d supply, fill #1

## 2023-02-12 ENCOUNTER — Other Ambulatory Visit (HOSPITAL_COMMUNITY): Payer: Self-pay

## 2023-02-12 ENCOUNTER — Other Ambulatory Visit: Payer: Self-pay

## 2023-02-13 DIAGNOSIS — D1801 Hemangioma of skin and subcutaneous tissue: Secondary | ICD-10-CM | POA: Diagnosis not present

## 2023-02-13 DIAGNOSIS — D229 Melanocytic nevi, unspecified: Secondary | ICD-10-CM | POA: Diagnosis not present

## 2023-02-13 DIAGNOSIS — L578 Other skin changes due to chronic exposure to nonionizing radiation: Secondary | ICD-10-CM | POA: Diagnosis not present

## 2023-02-13 DIAGNOSIS — L814 Other melanin hyperpigmentation: Secondary | ICD-10-CM | POA: Diagnosis not present

## 2023-02-13 DIAGNOSIS — Z411 Encounter for cosmetic surgery: Secondary | ICD-10-CM | POA: Diagnosis not present

## 2023-02-13 DIAGNOSIS — L821 Other seborrheic keratosis: Secondary | ICD-10-CM | POA: Diagnosis not present

## 2023-02-14 ENCOUNTER — Other Ambulatory Visit (HOSPITAL_COMMUNITY): Payer: Self-pay

## 2023-02-28 ENCOUNTER — Other Ambulatory Visit (HOSPITAL_COMMUNITY): Payer: Self-pay

## 2023-03-04 ENCOUNTER — Other Ambulatory Visit (HOSPITAL_COMMUNITY): Payer: Self-pay

## 2023-03-04 ENCOUNTER — Other Ambulatory Visit: Payer: Self-pay

## 2023-03-04 ENCOUNTER — Ambulatory Visit
Admission: RE | Admit: 2023-03-04 | Discharge: 2023-03-04 | Disposition: A | Discharge status: Home / Self Care | Payer: Commercial Managed Care - PPO | Source: Ambulatory Visit | Attending: Family Medicine | Admitting: Family Medicine

## 2023-03-04 DIAGNOSIS — Z1231 Encounter for screening mammogram for malignant neoplasm of breast: Secondary | ICD-10-CM | POA: Diagnosis not present

## 2023-03-04 DIAGNOSIS — E109 Type 1 diabetes mellitus without complications: Secondary | ICD-10-CM | POA: Diagnosis not present

## 2023-03-06 ENCOUNTER — Other Ambulatory Visit (HOSPITAL_COMMUNITY): Payer: Self-pay

## 2023-03-06 ENCOUNTER — Other Ambulatory Visit: Payer: Self-pay | Admitting: Family Medicine

## 2023-03-06 DIAGNOSIS — R928 Other abnormal and inconclusive findings on diagnostic imaging of breast: Secondary | ICD-10-CM

## 2023-03-07 ENCOUNTER — Other Ambulatory Visit (HOSPITAL_COMMUNITY): Payer: Self-pay

## 2023-03-12 ENCOUNTER — Other Ambulatory Visit (HOSPITAL_COMMUNITY): Payer: Self-pay

## 2023-03-12 ENCOUNTER — Ambulatory Visit: Payer: Commercial Managed Care - PPO

## 2023-03-12 ENCOUNTER — Ambulatory Visit
Admission: RE | Admit: 2023-03-12 | Discharge: 2023-03-12 | Disposition: A | Discharge status: Home / Self Care | Payer: Commercial Managed Care - PPO | Source: Ambulatory Visit | Attending: Family Medicine | Admitting: Family Medicine

## 2023-03-12 ENCOUNTER — Other Ambulatory Visit: Payer: Self-pay

## 2023-03-12 DIAGNOSIS — R928 Other abnormal and inconclusive findings on diagnostic imaging of breast: Secondary | ICD-10-CM | POA: Diagnosis not present

## 2023-03-13 ENCOUNTER — Other Ambulatory Visit: Payer: Self-pay

## 2023-03-13 ENCOUNTER — Other Ambulatory Visit (HOSPITAL_COMMUNITY): Payer: Self-pay

## 2023-03-13 ENCOUNTER — Other Ambulatory Visit: Payer: Self-pay | Admitting: Internal Medicine

## 2023-03-14 ENCOUNTER — Other Ambulatory Visit (HOSPITAL_COMMUNITY): Payer: Self-pay

## 2023-03-14 MED ORDER — TECHLITE PLUS PEN NEEDLES 32G X 4 MM MISC
1.0000 | Freq: Every day | 3 refills | Status: DC
  Filled 2023-03-14: qty 300, 50d supply, fill #0
  Filled 2023-05-19: qty 300, 50d supply, fill #1
  Filled 2023-07-08: qty 300, 50d supply, fill #2
  Filled 2023-08-26: qty 300, 50d supply, fill #3

## 2023-03-17 ENCOUNTER — Ambulatory Visit: Payer: 59 | Admitting: Nurse Practitioner

## 2023-03-19 ENCOUNTER — Other Ambulatory Visit (HOSPITAL_COMMUNITY): Payer: Self-pay

## 2023-03-26 ENCOUNTER — Other Ambulatory Visit (HOSPITAL_COMMUNITY): Payer: Self-pay

## 2023-04-07 ENCOUNTER — Ambulatory Visit: Payer: 59 | Admitting: Nurse Practitioner

## 2023-04-08 ENCOUNTER — Other Ambulatory Visit: Payer: Self-pay

## 2023-04-08 ENCOUNTER — Other Ambulatory Visit (HOSPITAL_COMMUNITY): Payer: Self-pay

## 2023-04-09 ENCOUNTER — Other Ambulatory Visit (HOSPITAL_COMMUNITY): Payer: Self-pay

## 2023-04-09 MED ORDER — LEVOCETIRIZINE DIHYDROCHLORIDE 5 MG PO TABS
5.0000 mg | ORAL_TABLET | Freq: Every evening | ORAL | 1 refills | Status: DC
  Filled 2023-04-09: qty 30, 30d supply, fill #0
  Filled 2023-05-19: qty 30, 30d supply, fill #1

## 2023-04-11 ENCOUNTER — Encounter: Payer: Self-pay | Admitting: Internal Medicine

## 2023-04-13 ENCOUNTER — Other Ambulatory Visit: Payer: Self-pay | Admitting: Internal Medicine

## 2023-04-14 ENCOUNTER — Other Ambulatory Visit (HOSPITAL_COMMUNITY): Payer: Self-pay

## 2023-04-14 ENCOUNTER — Other Ambulatory Visit: Payer: Self-pay

## 2023-04-14 ENCOUNTER — Other Ambulatory Visit: Payer: Self-pay | Admitting: Internal Medicine

## 2023-04-14 MED ORDER — HUMULIN N KWIKPEN 100 UNIT/ML ~~LOC~~ SUPN
2.0000 [IU] | PEN_INJECTOR | Freq: Every day | SUBCUTANEOUS | 11 refills | Status: DC
  Filled 2023-04-14: qty 3, 75d supply, fill #0
  Filled 2023-04-15: qty 3, 14d supply, fill #0
  Filled 2023-04-15: qty 12, 56d supply, fill #0
  Filled 2023-07-08: qty 15, 70d supply, fill #1

## 2023-04-14 MED ORDER — TRESIBA FLEXTOUCH 100 UNIT/ML ~~LOC~~ SOPN
13.0000 [IU] | PEN_INJECTOR | Freq: Every day | SUBCUTANEOUS | 3 refills | Status: DC
  Filled 2023-04-14: qty 9, 69d supply, fill #0
  Filled 2023-07-08: qty 9, 69d supply, fill #1
  Filled 2023-09-11: qty 9, 69d supply, fill #2
  Filled 2023-11-19: qty 9, 69d supply, fill #3

## 2023-04-14 NOTE — Telephone Encounter (Signed)
Which NPH insulin do you advise and instructions.

## 2023-04-15 ENCOUNTER — Other Ambulatory Visit (HOSPITAL_COMMUNITY): Payer: Self-pay

## 2023-04-15 ENCOUNTER — Other Ambulatory Visit: Payer: Self-pay

## 2023-04-25 ENCOUNTER — Other Ambulatory Visit (HOSPITAL_COMMUNITY): Payer: Self-pay

## 2023-05-01 ENCOUNTER — Other Ambulatory Visit (HOSPITAL_COMMUNITY): Payer: Self-pay

## 2023-05-06 ENCOUNTER — Other Ambulatory Visit (HOSPITAL_BASED_OUTPATIENT_CLINIC_OR_DEPARTMENT_OTHER): Payer: Self-pay

## 2023-05-06 MED ORDER — COMIRNATY 30 MCG/0.3ML IM SUSY
0.3000 mL | PREFILLED_SYRINGE | Freq: Once | INTRAMUSCULAR | 0 refills | Status: AC
  Filled 2023-05-06: qty 0.3, 1d supply, fill #0

## 2023-05-10 ENCOUNTER — Other Ambulatory Visit (HOSPITAL_COMMUNITY): Payer: Self-pay

## 2023-05-13 ENCOUNTER — Other Ambulatory Visit (HOSPITAL_COMMUNITY): Payer: Self-pay

## 2023-05-15 ENCOUNTER — Ambulatory Visit: Payer: 59 | Admitting: Nurse Practitioner

## 2023-05-19 ENCOUNTER — Other Ambulatory Visit: Payer: Self-pay

## 2023-05-21 ENCOUNTER — Other Ambulatory Visit (HOSPITAL_COMMUNITY): Payer: Self-pay

## 2023-05-29 ENCOUNTER — Other Ambulatory Visit (HOSPITAL_COMMUNITY): Payer: Self-pay

## 2023-05-30 ENCOUNTER — Other Ambulatory Visit (HOSPITAL_COMMUNITY): Payer: Self-pay

## 2023-06-16 ENCOUNTER — Other Ambulatory Visit (HOSPITAL_COMMUNITY): Payer: Self-pay

## 2023-06-16 ENCOUNTER — Other Ambulatory Visit: Payer: Self-pay

## 2023-06-17 ENCOUNTER — Other Ambulatory Visit (HOSPITAL_COMMUNITY): Payer: Self-pay

## 2023-06-17 MED ORDER — LISINOPRIL 2.5 MG PO TABS
2.5000 mg | ORAL_TABLET | Freq: Every day | ORAL | 0 refills | Status: DC
  Filled 2023-06-17: qty 90, 90d supply, fill #0

## 2023-06-17 MED ORDER — LEVOCETIRIZINE DIHYDROCHLORIDE 5 MG PO TABS
5.0000 mg | ORAL_TABLET | Freq: Every evening | ORAL | 3 refills | Status: DC
  Filled 2023-06-17: qty 30, 30d supply, fill #0
  Filled 2023-07-15: qty 30, 30d supply, fill #1
  Filled 2023-08-14: qty 30, 30d supply, fill #2
  Filled 2023-09-10: qty 30, 30d supply, fill #3

## 2023-07-03 ENCOUNTER — Encounter: Payer: Self-pay | Admitting: Internal Medicine

## 2023-07-03 ENCOUNTER — Ambulatory Visit: Payer: Commercial Managed Care - PPO | Admitting: Internal Medicine

## 2023-07-03 VITALS — BP 120/60 | HR 76 | Ht 68.0 in | Wt 159.4 lb

## 2023-07-03 DIAGNOSIS — E039 Hypothyroidism, unspecified: Secondary | ICD-10-CM | POA: Diagnosis not present

## 2023-07-03 DIAGNOSIS — E1065 Type 1 diabetes mellitus with hyperglycemia: Secondary | ICD-10-CM | POA: Diagnosis not present

## 2023-07-03 LAB — POCT GLYCOSYLATED HEMOGLOBIN (HGB A1C): Hemoglobin A1C: 6.8 % — AB (ref 4.0–5.6)

## 2023-07-03 NOTE — Progress Notes (Signed)
 Patient ID: Maria Evans, female   DOB: [birth year], 58 y.o.   MRN: [id number]   HPI: Maria Evans is a 58 y.o.-year-old very pleasant female, initially referred by her previous endocrinologist, Dr. Ronal POUR. [place] city, [place]  - moved here end of 2020), returning for follow-up for DM1, diagnosed at 58 y/o, fairly well controlled, without long term complications and also hypothyroidism. Last visit 6 months ago.  Interim history: No increased urination, blurry vision, nausea, chest pain.  DM1:  Reviewed HbA1c levels: Lab Results  Component Value Date   HGBA1C 6.9 12/24/2022   HGBA1C 6.3 (A) 08/01/2022   HGBA1C 6.3 (A) 02/11/2022   HGBA1C 6.4 (A) 10/11/2021   HGBA1C 6.9 (A) 06/08/2021   HGBA1C 6.3 (A) 02/13/2021   HGBA1C 6.2 (A) 10/09/2020   HGBA1C 6.3 (A) 06/09/2020   HGBA1C 6.9 (A) 03/09/2020  02/08/2019: HbA1c 7.5%  Previously on Omni pod 2017-fall 2020 but stopped after running out of supplies and had problems with the insertion sites. She tried Afrezza >> chronic cough - but she liked it.  She is on: - Tresiba  12 >> 10 >> 11 units at bedtime - Lyumjev  2-6 units 3 times a day before meals + correction >> ends up with ~ 4- max 10 units - 1:10 ICR, target 120, ISF 60-65  - 1:8 ICR with carbs/starches Try to use a lower Lyumjev  dose when you plan to be active after a meal (e.g. ICR of 1:12). Recheck sugars with the glucometer when correcting a low. - NPH 1 unit for 15 g carbs - taken at bedtime -started 12/2022 She tried inhaled insulin  in the past but she had a persistently severe cough and had to stop. She tried Glimepiride - not very effective.  She is on the Dexcom CGM:  Previously: - am: ? - 2h after b'fast: ? - lunch: ? - 2h after lunch: ? - dinner:? - 2h after dinner: 94-154 - bedtime: 210-300 - nighttime: 200s  Previously:  Previously:   Lowest sugar was 20-30s before after starting insulin  ...45 >> 48 (overbolusing); she has  hypoglycemia awareness in the 70s. I sent a prescription for a glucagon  kit at last visit. No previous hypoglycemia admission. She may drop her sugars after exercise -usually exercises earlier in the day. Highest sugar was >600 (pump failed) ... >> HI (b'day party) >> HI (out to dinner, forgot insulin ). No previous DKA admissions.  Pt's meals are: - Breakfast: Fruit, peanut butter and banana sandwich, oatmeal with fruit - Lunch: Turkey and cheese sandwich + pickle - Dinner: Home-cooked family meals - Snacks: 1 to 2 almonds, cheese, fruit, veggies  -+ History of stage III CKD, resolved after stopping NSAIDs; last BUN/creatinine:  Lab Results  Component Value Date   BUN 18 08/01/2022   Lab Results  Component Value Date   CREATININE 0.78 08/01/2022   Lab Results  Component Value Date   MICRALBCREAT 2.3 08/01/2022   MICRALBCREAT 4.6 06/09/2020  Started Lisinopril  2.5 mg daily- 07/2021.  -+ HL; last set of lipids: Lab Results  Component Value Date   CHOL 163 08/01/2022   HDL 69.70 08/01/2022   LDLCALC 82 08/01/2022   TRIG 60.0 08/01/2022   CHOLHDL 2 08/01/2022  On Lipitor 20 - moved to every other day.  - last eye exam was in 01/2023: No DR - reportedly.  -No numbness and tingling in her feet.  Last foot exam 12/24/2022.  + FH of DM. Her twin brother was diagnosed with diabetes,  also, in 2023.  Hypothyroidism:  Pt is on levothyroxine  100 mcg daily (dose decreased 07/2020), taken: - in am << moved from evening - fasting - at least 30 min from b'fast - no calcium  - no iron - + multivitamins later in the day - no PPIs - not on Biotin  Reviewed TSH levels: Lab Results  Component Value Date   TSH 2.71 08/01/2022   TSH 1.89 10/11/2021   TSH 0.73 10/09/2020   TSH 0.27 (L) 07/28/2020   TSH 0.36 06/09/2020  02/09/2019: TSH 0.7474   Pt denies: - feeling nodules in neck - hoarseness - dysphagia - choking  She also has a history of anxiety/depression, migraines. She  has teeth grinding and jaw clenching. Wears a retainer.  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  She works either early in the day or a later shift from 3 PM to 10 PM.  ROS: + See HPI  I reviewed pt's medications, allergies, PMH, social hx, family hx, and changes were documented in the history of present illness. Otherwise, unchanged from my initial visit note.  Past Medical History:  Diagnosis Date   Anxiety    Anxiety with depression    Controlled.   Common migraine without aura    Diabetes mellitus type I, controlled (HCC)    Treated with 4 times daily insulin  based on blood sugar monitoring   Hyperlipidemia due to type 1 diabetes mellitus (HCC)    Currently on atorvastatin .   Hypothyroid    Lipoma    MVP (mitral valve prolapse) 2005   Renally diagnosed back in 2005 (during her pregnancy)_, most recently evaluated in 2012.  [facility], Dr. Robinson); 11/2010 TTE: Moderate MR, posteriorly directed (recommend TEE) => TEE (01/2011) - mild MVP (anterior leaflet) with Mild-Mod MR.;    Past Surgical History:  Procedure Laterality Date   LAPAROSCOPIC LYSIS OF ADHESIONS     LIPOMA EXCISION Left    LUMBAR MICRODISCECTOMY     TRANSESOPHAGEAL ECHOCARDIOGRAM  02/13/2011   [facility] -> Dr. Robinson [facility]): EF 55 to 60%.  Borderline LA dilation.  Prolapse of A2 scallop of mitral valve-mild MVP with mild to moderate MR.  (2 eccentric jets, posteriorly directed, c/w anterior leaflet pathology))   TRANSTHORACIC ECHOCARDIOGRAM  12/11/2010   [facility] -> Dr. Robinson [facility]): EF 35 to 6%.  Mild LA dilation.  Moderate MR-eccentric/possibly directed jet consistent with antilipid pathology.  Mild TR, LP HTN, mild PR.  Recommend TEE.   TREADMILL STRESS ECHOCARDIOGRAM     [facility] -> Dr. Robinson [facility]): Exercised 10 minutes (11  minutes), achieved Max Heart Rate 162 = 92% Max Predicted Heart Rate, clinically and electrocardiographically negative.  Nonspecific ST and T wave changes.  Prestress echo showed EF 55 to 60% with normal wall motion.  Post-Stress: EF 65 to 70% with nl augmentation of all walls.=> NORMAL STRESS [facility]   Social History   Socioeconomic History   Marital status: Married    Spouse name: Not on file   Number of children: 1   Years of education: Not on file   Highest education level: Not on file  Occupational History   Occupation: Hospital pharmacist     Employer: [employer]  Tobacco Use   Smoking status: Never   Smokeless tobacco: Never  Vaping Use   Vaping status: Never Used  Substance and Sexual Activity   Alcohol use: Yes    Comment:  occ, wine or beer   Drug use: Never   Sexual activity: Yes    Birth control/protection: Post-menopausal  Other Topics Concern   Not on file  Social History Narrative   She is currently divorced mother of 20 (61 year old son).   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    She is now with her longtime high school significant other after completing the divorce from her first husband.     She and her fianc intend to elope probably in May or June of this year.      She is an avid exerciser.  Enjoys jogging and walking.  She does not list 2 miles with treadmill and walks about 45 minutes most days - either indoors on the treadmill or outside.         Social Drivers of Corporate Investment Banker Strain: Not on file  Food Insecurity: Not on file  Transportation Needs: Not on file  Physical Activity: Not on file  Stress: Not on file  Social Connections: Not on file  Intimate Partner Violence: Not on file   Current Outpatient Medications on File Prior to Visit  Medication Sig Dispense Refill   ALPRAZolam   (XANAX ) 0.5 MG tablet Take 0.5 mg by mouth as needed for anxiety or sleep.     ALPRAZolam  (XANAX ) 0.5 MG tablet Take 1 tablet (0.5 mg total) by mouth daily as needed. 30 tablet 0   atorvastatin  (LIPITOR) 20 MG tablet Take 1 tablet (20 mg total) by mouth every other day. 90 tablet 1   Blood Pressure Monitoring (OMRON 3 SERIES BP MONITOR) DEVI Use as directed 1 each 0   Continuous Blood Gluc Receiver (DEXCOM G6 RECEIVER) DEVI Use to check blood sugar 4 times daily 1 each 0   Continuous Glucose Transmitter (DEXCOM G6 TRANSMITTER) MISC Use as instructed to check blood sugar change every 90 days 1 each 3   Continuous Glucose Sensor (DEXCOM G6 SENSOR) MISC USE WITH DEXCOM TO CHECK BLOOD SUGAR 4 TIMES DAILY 9 each 3   cyclobenzaprine  (FLEXERIL ) 10 MG tablet Take 0.5-1 tablets (5-10 mg total) by mouth 3 (three) times daily as needed for muscle spasms. 30 tablet 0   cyclobenzaprine  (FLEXERIL ) 10 MG tablet Take 1 tablet (10 mg total) by mouth at bedtime as needed. 30 tablet 0   escitalopram  (LEXAPRO ) 20 MG tablet Take 1 tablet (20 mg total) by mouth daily. 90 tablet 3   fluocinonide  gel (LIDEX ) 0.05 % Apply to affected areas twice daily as needed for flares 30 g 1   fluocinonide  gel (LIDEX ) 0.05 % Apply Externally Twice a day as needed for flare ups 30 g 1   Glucagon  3 MG/DOSE POWD Place 3 mg into the nose once as needed for up to 1 dose. 1 each 11   glucose blood (FREESTYLE TEST STRIPS) test strip Use as instructed 1-2 times a day 100 each 12   GVOKE HYPOPEN  1-PACK 1 MG/0.2ML SOAJ Inject 0.2 mLs into the skin as needed. 0.2 mL PRN   insulin  degludec (TRESIBA  FLEXTOUCH) 100 UNIT/ML FlexTouch Pen Inject up to 13 Units into the skin daily. 9 mL 3   Insulin  Lispro-aabc (LYUMJEV  KWIKPEN) 100 UNIT/ML KwikPen Inject up to 8 units before each meal 3-4 times a day 15 mL 11   Insulin  NPH, Human,, Isophane, (HUMULIN  N KWIKPEN) 100 UNIT/ML [employer]  Inject 2-4 Units into the skin daily in the afternoon. 15 mL 11    Insulin  Pen Needle (TECHLITE PLUS PEN NEEDLES) 32G X 4 MM MISC use as directed 4-6 times daily 300 each 3   Insulin  Regular Human (NOVOLIN  R FLEXPEN RELION) 100 UNIT/ML KwikPen Use 6-8 units under skin 1x a day before dinner 3 mL 3   Insulin  Syringe-Needle U-100 (GNP INSULIN  SYRINGES 31GX5/16) 31G X 5/16 0.3 ML MISC Use to inject 4 Units  daily. 100 each 0   levocetirizine (XYZAL ) 5 MG tablet Take 1 tablet (5 mg total) by mouth every evening. 30 tablet 3   levothyroxine  (SYNTHROID ) 100 MCG tablet Take 1 tablet (100 mcg total) by mouth daily before breakfast. 90 tablet 3   lisinopril  (ZESTRIL ) 2.5 MG tablet Take 1 tablet (2.5 mg total) by mouth daily. 90 tablet 0   Multiple Vitamin (MULTIVITAMIN) capsule Take 1 capsule by mouth daily.     pseudoephedrine  (SUDAFED) 30 MG tablet Take 1 tablet (30 mg total) by mouth every 8 (eight) hours as needed for congestion. 30 tablet 0   traMADol  (ULTRAM ) 50 MG tablet Take 1 tablet by mouth once daily as needed 15 tablet 0   traMADol  (ULTRAM ) 50 MG tablet Take 1 tablet (50 mg total) by mouth daily as needed. 15 tablet 0   valACYclovir  (VALTREX ) 1000 MG tablet Take 1,000 mg by mouth as needed (fever blisters).     No current facility-administered medications on file prior to visit.   Allergies  Allergen Reactions   Nitrofurantoin Itching and Other (See Comments)   Family History  Problem Relation Age of Onset   Hyperlipidemia Mother    Hyperlipidemia Father    Heart Problems Father    Esophageal cancer Maternal Grandfather        Long-term smoker   CAD Paternal Grandmother        Long-term smoker   Heart attack Paternal Grandmother 1       Cause of death   Stroke Paternal Grandmother    Breast cancer Neg Hx    PE: BP 120/60   Pulse 76   Ht 5' 8 (1.727 m)   Wt 159 lb 6.4 oz (72.3 kg)   LMP 02/07/2016   SpO2 98%   BMI 24.24 kg/m  Wt Readings from Last 3 Encounters:  07/03/23 159 lb 6.4 oz (72.3 kg)  12/24/22 157 lb 9.6 oz (71.5 kg)   08/01/22 152 lb 12.8 oz (69.3 kg)     Constitutional: normal weight, in NAD Eyes:EOMI, no exophthalmos ENT: no thyromegaly, no cervical lymphadenopathy Cardiovascular: RRR, No MRG Respiratory: CTA B Musculoskeletal: no deformities Skin: no rashes Neurological: no tremor with outstretched hands  ASSESSMENT: 1. DM1, fairly well controlled, without long-term complications, but with hyperglycemia -She has a history of CKD stage III, which resolved after stopping NSAIDs  2. Hypothyroidism  PLAN:  1. Patient with longstanding, fairly well-controlled type 1 diabetes, on basal-bolus insulin  regimen.  She was previously on the OmniPod insulin  pump but ran out of OmniPod supplies and preferred to stay on MDIs afterwards.  At last visit, she declined as downloading her CGM due to the cost that she had to pay for interpretation.  We did discuss that it would be difficult to make medication changes if I am not able to review her tracings, but she mentioned that the sugars were similar than before but with higher values, in the 200s and sometimes 300s at night, and staying elevated throughout the night despite  good blood sugars after dinner.  We discussed about continuing the same regimen but potentially using NPH before dinner.  She was interested in Afrezza for correction but she had cough with this in the past so I did not recommend this.  She again declined an insulin  pump.  HbA1c at last visit was 6.9%, higher. CGM interpretation: -At today's visit, we reviewed her CGM downloads: It appears that 76% of values are in target range (goal >70%), while 20% are higher than 180 (goal <25%), and 4% are lower than 70 (goal <4%).  The calculated average blood sugar is 145.  The projected HbA1c for the next 3 months (GMI) is 6.8%. -Reviewing the CGM trends, sugars appear to be improved, with only occasional hyperglycemic values between 12 AM and approximately 3 AM, and a decrease in blood sugars afterwards.  In  the rest of the day, sugars appear to be well-controlled with occasional mild lows and also few hyperglycemic spikes after dinner, especially after eating out.  Since last visit, she did start NPH and she uses 1 unit for every 15 carbs eaten for dinner.  She feels that this greatly helped the blood sugars during the night.  She does take this at bedtime.  Since the sugars are dropping in the early morning hours, I advised her to move the NPH before dinner.  Otherwise, with the sugars improving, I did not recommend a change in regimen. - I suggested to: Patient Instructions  Please continue: - Tresiba  11 units at bedtime - Lyumjev  4-10 units 3 times a day before meals + correction  - 1:10 ICR, target 120, ISF 60-65  (- 1:8 ICR with carbs/starches) Try to use a lower Lyumjev  dose when you plan to be active after a meal (e.g. ICR of 1:12). - NPH 1 unit for 15 g carbs - move this before dinner  Do not correct hyperglycemia with more than 1 unit. Do not correct hypoglycemia with more than 2 Garrel and Ike's.  Please continue Levothyroxine  100 mcg daily.  Take the thyroid  hormone every day, with water, at least 30 minutes before breakfast, separated by at least 4 hours from: - acid reflux medications - calcium  - iron - multivitamins  Please return in 6 months.  - we checked her HbA1c: 6.8% (slightly lower) - advised to check sugars at different times of the day - 4x a day, rotating check times - advised for yearly eye exams >> she is UTD - will check annual labs today - return to clinic in 6 months  2. Hypothyroidism - latest thyroid  labs reviewed with pt. >> normal: Lab Results  Component Value Date   TSH 2.71 08/01/2022  - she continues on LT4 100 mcg daily - pt feels good on this dose. - we discussed about taking the thyroid  hormone every day, with water, >30 minutes before breakfast, separated by >4 hours from acid reflux medications, calcium , iron, multivitamins. Pt. is taking it  correctly. - will check thyroid  tests today: TSH and fT4 - If labs are abnormal, she will need to return for repeat TFTs in 1.5 months  Needs LT4 refills.   Component     Latest Ref Rng 07/03/2023  Hemoglobin A1C     4.0 - 5.6 % 6.8 !   Sodium     135 - 146 mmol/L 141   Potassium     3.5 - 5.3 mmol/L 4.4   Chloride     98 - 110 mmol/L 101   CO2  20 - 32 mmol/L 31   Glucose     65 - 99 mg/dL 887 (H)   BUN     7 - 25 mg/dL 17   Creatinine     9.49 - 1.03 mg/dL 9.20   Total Bilirubin     0.2 - 1.2 mg/dL 0.4   AST     10 - 35 U/L 23   ALT     6 - 29 U/L 18   Total Protein     6.1 - 8.1 g/dL 7.1   Calcium      8.6 - 10.4 mg/dL 9.7   Cholesterol     <799 mg/dL 799 (H)   Triglycerides     <150 mg/dL 76   HDL Cholesterol     > OR = 50 mg/dL 90   Total CHOL/HDL Ratio     <5.0 (calc) 2.2   TSH     0.40 - 4.50 mIU/L 1.16   T4,Free(Direct)     0.8 - 1.8 ng/dL 1.3   eGFR     > OR = 60 mL/min/1.56m2 88   BUN/Creatinine Ratio     6 - 22 (calc) SEE NOTE:   Albumin MSPROF     3.6 - 5.1 g/dL 4.6   Globulin     1.9 - 3.7 g/dL (calc) 2.5   AG Ratio     1.0 - 2.5 (calc) 1.8   Alkaline phosphatase (APISO)     37 - 153 U/L 73   LDL Cholesterol (Calc)     mg/dL (calc) 93   Non-HDL Cholesterol (Calc)     <130 mg/dL (calc) 889   Labs are at goal except for the slightly high total cholesterol and slightly high glucose.  Lela Fendt, MD PhD [facility]

## 2023-07-03 NOTE — Patient Instructions (Addendum)
 Please continue: - Tresiba  11 units at bedtime - Lyumjev  4-10 units 3 times a day before meals + correction  - 1:10 ICR, target 120, ISF 60-65  (- 1:8 ICR with carbs/starches) Try to use a lower Lyumjev  dose when you plan to be active after a meal (e.g. ICR of 1:12). - NPH 1 unit for 15 g carbs - move this before dinner  Do not correct hyperglycemia with more than 1 unit. Do not correct hypoglycemia with more than 2 Garrel and Ike's.  Please continue Levothyroxine  100 mcg daily.  Take the thyroid  hormone every day, with water, at least 30 minutes before breakfast, separated by at least 4 hours from: - acid reflux medications - calcium  - iron - multivitamins  Please return in 6 months.

## 2023-07-04 ENCOUNTER — Ambulatory Visit: Payer: Commercial Managed Care - PPO | Admitting: Internal Medicine

## 2023-07-04 ENCOUNTER — Other Ambulatory Visit (HOSPITAL_COMMUNITY): Payer: Self-pay

## 2023-07-04 MED ORDER — LEVOTHYROXINE SODIUM 100 MCG PO TABS
100.0000 ug | ORAL_TABLET | Freq: Every day | ORAL | 3 refills | Status: DC
  Filled 2023-07-04 – 2023-07-28 (×2): qty 90, 90d supply, fill #0
  Filled 2023-11-07: qty 90, 90d supply, fill #1
  Filled 2024-02-20: qty 90, 90d supply, fill #2
  Filled 2024-05-21: qty 90, 90d supply, fill #3

## 2023-07-05 LAB — TSH: TSH: 1.16 m[IU]/L (ref 0.40–4.50)

## 2023-07-05 LAB — MICROALBUMIN / CREATININE URINE RATIO
Creatinine, Urine: 14 mg/dL — ABNORMAL LOW (ref 20–275)
Microalb, Ur: <0.2 mg/dL

## 2023-07-05 LAB — COMPLETE METABOLIC PANEL WITH GFR
AG Ratio: 1.8 (calc) (ref 1.0–2.5)
ALT: 18 U/L (ref 6–29)
AST: 23 U/L (ref 10–35)
Albumin: 4.6 g/dL (ref 3.6–5.1)
Alkaline phosphatase (APISO): 73 U/L (ref 37–153)
BUN: 17 mg/dL (ref 7–25)
CO2: 31 mmol/L (ref 20–32)
Calcium: 9.7 mg/dL (ref 8.6–10.4)
Chloride: 101 mmol/L (ref 98–110)
Creat: 0.79 mg/dL (ref 0.50–1.03)
Globulin: 2.5 g/dL (ref 1.9–3.7)
Glucose, Bld: 112 mg/dL — ABNORMAL HIGH (ref 65–99)
Potassium: 4.4 mmol/L (ref 3.5–5.3)
Sodium: 141 mmol/L (ref 135–146)
Total Bilirubin: 0.4 mg/dL (ref 0.2–1.2)
Total Protein: 7.1 g/dL (ref 6.1–8.1)
eGFR: 88 mL/min/{1.73_m2} (ref >=60)

## 2023-07-05 LAB — LIPID PANEL W/REFLEX DIRECT LDL
Cholesterol: 200 mg/dL — ABNORMAL HIGH (ref <200)
HDL: 90 mg/dL (ref >=50)
LDL Cholesterol (Calc): 93 mg/dL
Non-HDL Cholesterol (Calc): 110 mg/dL (ref <130)
Total CHOL/HDL Ratio: 2.2 (calc) (ref <5.0)
Triglycerides: 76 mg/dL (ref <150)

## 2023-07-05 LAB — T4, FREE: Free T4: 1.3 ng/dL (ref 0.8–1.8)

## 2023-07-08 ENCOUNTER — Other Ambulatory Visit: Payer: Self-pay

## 2023-07-09 ENCOUNTER — Other Ambulatory Visit (HOSPITAL_COMMUNITY): Payer: Self-pay

## 2023-07-21 DIAGNOSIS — G8911 Acute pain due to trauma: Secondary | ICD-10-CM | POA: Diagnosis not present

## 2023-07-21 DIAGNOSIS — S79911A Unspecified injury of right hip, initial encounter: Secondary | ICD-10-CM | POA: Diagnosis not present

## 2023-07-21 DIAGNOSIS — M25551 Pain in right hip: Secondary | ICD-10-CM | POA: Diagnosis not present

## 2023-07-21 DIAGNOSIS — M533 Sacrococcygeal disorders, not elsewhere classified: Secondary | ICD-10-CM | POA: Diagnosis not present

## 2023-07-24 ENCOUNTER — Encounter: Payer: Self-pay | Admitting: Family Medicine

## 2023-07-24 ENCOUNTER — Other Ambulatory Visit (HOSPITAL_COMMUNITY): Payer: Self-pay

## 2023-07-24 ENCOUNTER — Ambulatory Visit: Payer: Commercial Managed Care - PPO | Admitting: Family Medicine

## 2023-07-24 VITALS — BP 112/70 | HR 88 | Ht 68.0 in

## 2023-07-24 DIAGNOSIS — M25551 Pain in right hip: Secondary | ICD-10-CM

## 2023-07-24 DIAGNOSIS — M25552 Pain in left hip: Secondary | ICD-10-CM

## 2023-07-24 MED ORDER — TRAMADOL HCL 50 MG PO TABS
50.0000 mg | ORAL_TABLET | Freq: Three times a day (TID) | ORAL | 0 refills | Status: DC | PRN
Start: 2023-07-24 — End: 2023-07-31
  Filled 2023-07-24: qty 15, 5d supply, fill #0

## 2023-07-24 NOTE — Patient Instructions (Addendum)
Thank you for coming in today.  A referral has been placed for Physical Therapy with [facility] [place].
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
## 2023-07-27 ENCOUNTER — Other Ambulatory Visit: Payer: Self-pay | Admitting: Internal Medicine

## 2023-07-28 ENCOUNTER — Other Ambulatory Visit (HOSPITAL_COMMUNITY): Payer: Self-pay

## 2023-07-28 MED ORDER — DEXCOM G6 SENSOR MISC
3 refills | Status: DC
  Filled 2023-07-28: qty 9, 90d supply, fill #0
  Filled 2023-10-24: qty 9, 90d supply, fill #1
  Filled 2024-01-22: qty 9, 90d supply, fill #2

## 2023-07-30 ENCOUNTER — Encounter: Payer: Self-pay | Admitting: Nurse Practitioner

## 2023-07-30 ENCOUNTER — Other Ambulatory Visit: Payer: Self-pay | Admitting: Nurse Practitioner

## 2023-07-30 ENCOUNTER — Other Ambulatory Visit (HOSPITAL_COMMUNITY)
Admission: RE | Admit: 2023-07-30 | Discharge: 2023-07-30 | Disposition: A | Discharge status: Home / Self Care | Payer: Commercial Managed Care - PPO | Source: Ambulatory Visit | Attending: Nurse Practitioner | Admitting: Nurse Practitioner

## 2023-07-30 ENCOUNTER — Ambulatory Visit (INDEPENDENT_AMBULATORY_CARE_PROVIDER_SITE_OTHER): Payer: Commercial Managed Care - PPO | Admitting: Nurse Practitioner

## 2023-07-30 VITALS — BP 114/70 | HR 102 | Ht 68.75 in | Wt 157.0 lb

## 2023-07-30 DIAGNOSIS — M8589 Other specified disorders of bone density and structure, multiple sites: Secondary | ICD-10-CM | POA: Diagnosis not present

## 2023-07-30 DIAGNOSIS — Z124 Encounter for screening for malignant neoplasm of cervix: Secondary | ICD-10-CM | POA: Insufficient documentation

## 2023-07-30 DIAGNOSIS — N951 Menopausal and female climacteric states: Secondary | ICD-10-CM

## 2023-07-30 DIAGNOSIS — Z780 Asymptomatic menopausal state: Secondary | ICD-10-CM

## 2023-07-30 DIAGNOSIS — Z01419 Encounter for gynecological examination (general) (routine) without abnormal findings: Secondary | ICD-10-CM | POA: Diagnosis not present

## 2023-07-30 NOTE — Progress Notes (Signed)
 Maria Evans 22-Mar-1966 [id number]   History:  58 y.o. G1P0001 presents for annual exam without GYN complaints. Postmenopausal - no HRT, no bleeding. See abnormal pap history below. T1DM and hypothyroidism managed by endocrinology, MVP managed by cardiology, anxiety, HLD managed by PCP. Recent hip sprain while skiing, on crutches today. Complains of having significant hot flashes last summer, made her very irritable and wants to talk about HRT. These symptoms have resolved.   2003 LEEP 02/2018 positive high risk HPV negative 16/18 2020 Normal 02/2021 normal cytology + HR HPV 02/2022 ASCUS + HR HPV, CIN-1  Gynecologic History Patient's last menstrual period was 02/07/2016.   Contraception: post menopausal status Sexually active: Yes  Health maintenance Last Pap: 03/12/2022. Results were: ASCUS +HR HPV Last mammogram: 03/04/2023. Results were: Possible left breast asymmetry, follow up imaging normal Last colonoscopy: age 58 per patient. Results were: Normal Last Dexa: 05/15/2021. Results were: T-score -2.0 FRAX 6.5% / 0.9%  Past medical history, past surgical history, family history and social history were all reviewed and documented. 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 Loves to sail. 85 yo son, sophomore at [employer], considering computer science or cyber security degree.   ROS:  A ROS was performed and pertinent positives and negatives are included.  Exam:  Vitals:   07/30/23 1037  BP: 114/70  Pulse: (!) 102  SpO2: 99%  Weight: 157 lb (71.2 kg)  Height: 5' 8.75 (1.746 m)      Body mass index is 23.35 kg/m.  General appearance:  Normal Thyroid :  Symmetrical, normal in size, without palpable masses or nodularity. Respiratory  Auscultation:  Clear without wheezing or rhonchi Cardiovascular  Auscultation:  Regular rate, without rubs, murmurs or gallops  Edema/varicosities:  Not grossly evident Abdominal  Soft,nontender, without masses, guarding or  rebound.  Liver/spleen:  No organomegaly noted  Hernia:  Left femoral bulge with standing, non-tender  Skin  Inspection:  Grossly normal   Breasts: Examined lying and sitting.   Right: Without masses, retractions, discharge or axillary adenopathy.   Left: Without masses, retractions, discharge or axillary adenopathy. Pelvic: External genitalia:  no lesions              Urethra:  normal appearing urethra with no masses, tenderness or lesions              Bartholins and Skenes: normal                 Vagina: normal appearing vagina with normal color and discharge, no lesions              Cervix: no lesions Bimanual Exam:  Uterus:  no masses or tenderness              Adnexa: no mass, fullness, tenderness              Rectovaginal: Deferred              Anus:  normal, no lesions  Patient informed chaperone available to be present for breast and pelvic exam. Patient has requested no chaperone to be present. Patient has been advised what will be completed during breast and pelvic exam.   Assessment/Plan:  58 y.o. G1P0001 for annual exam.   Well female exam with routine gynecological exam - Education provided on SBEs, importance of preventative screenings, current guidelines, high calcium  diet, regular exercise, and multivitamin daily. Labs done elsewhere.  Postmenopausal -  No HRT, no bleeding. LMP in 2017. Complains of having significant hot flashes  last summer, made her very irritable and wants to talk about HRT. These symptoms have resolved. H/O thyroid  disease - reports normal levels. Will monitor and she will reach out if vasomotor symptoms return.   Osteopenia of multiple sites - Plan: DG Bone Density. 04/2021 T-score -2.0 without elevated FRAX. Continue daily Vitamin D supplement and regular exercise. She consumes high calcium  diet. Recommend repeating DXA. Patient will schedule at [facility].   Cervical cancer screening - Plan: Cytology - PAP( Maria Evans). 2003 LEEP, 2019 positive HR HPV,  02/2021 normal cytology + HR HPV. 2023 ASCUS + HR HPV, CIN-1. Pap today per guidelines.   Screening for breast cancer - Normal mammogram history.  Continue annual screenings.  Normal breast exam today.  Screening for colon cancer - Colonoscopy at age 58 per patient.  Will repeat at GI's recommended interval.   Return in about 1 year (around 07/29/2024) for Annual.      Maria Evans Maria Evans Maria Evans [facility], 11:32 AM 07/30/2023

## 2023-07-31 ENCOUNTER — Other Ambulatory Visit (HOSPITAL_COMMUNITY): Payer: Self-pay

## 2023-07-31 ENCOUNTER — Ambulatory Visit: Payer: Commercial Managed Care - PPO

## 2023-07-31 ENCOUNTER — Ambulatory Visit: Payer: Commercial Managed Care - PPO | Admitting: Family Medicine

## 2023-07-31 VITALS — BP 112/70 | HR 78 | Ht 68.75 in

## 2023-07-31 DIAGNOSIS — M25551 Pain in right hip: Secondary | ICD-10-CM

## 2023-07-31 DIAGNOSIS — M25552 Pain in left hip: Secondary | ICD-10-CM | POA: Diagnosis not present

## 2023-07-31 DIAGNOSIS — S79911A Unspecified injury of right hip, initial encounter: Secondary | ICD-10-CM | POA: Diagnosis not present

## 2023-07-31 MED ORDER — CYCLOBENZAPRINE HCL 10 MG PO TABS
10.0000 mg | ORAL_TABLET | Freq: Every evening | ORAL | 1 refills | Status: AC | PRN
  Filled 2023-07-31: qty 60, 60d supply, fill #0

## 2023-07-31 MED ORDER — TRAMADOL HCL 50 MG PO TABS
50.0000 mg | ORAL_TABLET | Freq: Three times a day (TID) | ORAL | 0 refills | Status: DC | PRN
Start: 2023-07-31 — End: 2024-01-29
  Filled 2023-07-31: qty 15, 5d supply, fill #0

## 2023-07-31 NOTE — Progress Notes (Signed)
 LILLETTE Ileana Collet, PhD, LAT, ATC acting as a scribe for Artist Lloyd, MD.  Maria Evans is a 58 y.o. female who presents to [facility] at [facility] today for cont'd bilat hip pain following a skiing accident. Pt was last seen by Dr. Lloyd on 07/24/23 and was advised to cont using crutches, Tylenol , NSAIDs, and muscle relaxer, and was referred to PT, (1st visit scheduled for 2/10).  Today, pt reports only slight improvement in her hip pain. She doesn't have an understanding of what to expect in regards to the healing process and how she is to progress. She is still needing to use both crutches to ambulate. Pt locates pain to the sacrum/coccyx w/ a burning pain, R hamstring, and into both groins. No bladder or bowel dysfunction. Pt notes a lot of pain w/ trying to sit.     She rates her continued ongoing pain as severe limiting her ability to work.  Pertinent review of systems: No fevers or chills  Relevant historical information: Diabetes   Exam:  BP 112/70   Pulse 78   Ht 5' 8.75 (1.746 m)   LMP 02/07/2016   SpO2 (!) 88%   BMI 23.35 kg/m  General: Well Developed, well nourished, and in no acute distress.   MSK: Right hip normal-appearing decreased hip range of motion bilaterally.  Tender palpation right lateral hip and posterior hip at coccyx.    Lab and Radiology Results  X-ray images right hip obtained today personally and independently interpreted.   No acute fractures.  No severe DJD. Await formal radiology review.    Assessment and Plan: 58 y.o. female with severe diffuse hip and coccyx pain after skiing injury occurring on January 23.  I saw her last week and she has not improved/perhaps worsened a bit.  She is having much more pain than I would expect for this injury.  X-ray today is unrevealing.  Plan for MRI of the pelvis to evaluate soft tissue and bony injury is not seen on x-ray.  We did arrange an MRI to be penciled in or tentatively  scheduled for this upcoming Saturday, February 8. Refilled cyclobenzaprine  and tramadol .  PDMP not reviewed this encounter. Orders Placed This Encounter  Procedures   DG HIP UNILAT W OR W/O PELVIS 2-3 VIEWS RIGHT    Standing Status:   Future    Number of Occurrences:   1    Expiration Date:   08/28/2023    Reason for Exam (SYMPTOM  OR DIAGNOSIS REQUIRED):   hip pain    Preferred imaging location?:   [facility]    Is patient pregnant?:   No   MR PELVIS WO CONTRAST    Standing Status:   Future    Expiration Date:   07/30/2024    What is the patient's sedation requirement?:   No Sedation    Does the patient have a pacemaker or implanted devices?:   No    Preferred imaging location?:   [facility] (table limit-350lbs)   Meds ordered this encounter  Medications   cyclobenzaprine  (FLEXERIL ) 10 MG tablet    Sig: Take 1 tablet (10 mg total) by mouth at bedtime as needed.    Dispense:  60 tablet    Refill:  1   traMADol  (ULTRAM ) 50 MG tablet    Sig: Take 1 tablet (50 mg total) by mouth every 8 (eight) hours as needed for severe pain (pain score 7-10).    Dispense:  15  tablet    Refill:  0     Discussed warning signs or symptoms. Please see discharge instructions. Patient expresses understanding.   The above documentation has been reviewed and is accurate and complete Artist Lloyd, M.D.

## 2023-07-31 NOTE — Patient Instructions (Addendum)
 Thank you for coming in today.   MRI scheduled.  130pm Saturday [facility] [address] [place], [place] [address] [phone]  Ok to proceed to PT unless we find out otherwise.

## 2023-08-02 ENCOUNTER — Ambulatory Visit (INDEPENDENT_AMBULATORY_CARE_PROVIDER_SITE_OTHER): Payer: Commercial Managed Care - PPO

## 2023-08-02 DIAGNOSIS — S32592A Other specified fracture of left pubis, initial encounter for closed fracture: Secondary | ICD-10-CM | POA: Diagnosis not present

## 2023-08-02 DIAGNOSIS — M51369 Other intervertebral disc degeneration, lumbar region without mention of lumbar back pain or lower extremity pain: Secondary | ICD-10-CM | POA: Diagnosis not present

## 2023-08-02 DIAGNOSIS — M25551 Pain in right hip: Secondary | ICD-10-CM

## 2023-08-02 DIAGNOSIS — R600 Localized edema: Secondary | ICD-10-CM | POA: Diagnosis not present

## 2023-08-02 DIAGNOSIS — M25552 Pain in left hip: Secondary | ICD-10-CM

## 2023-08-02 DIAGNOSIS — S32591A Other specified fracture of right pubis, initial encounter for closed fracture: Secondary | ICD-10-CM | POA: Diagnosis not present

## 2023-08-04 ENCOUNTER — Encounter: Payer: Self-pay | Admitting: Family Medicine

## 2023-08-04 ENCOUNTER — Ambulatory Visit: Payer: Commercial Managed Care - PPO | Admitting: Rehabilitative and Restorative Service Providers"

## 2023-08-04 NOTE — Telephone Encounter (Signed)
 Dr. Alease Hunter spoke with patient this morning regarding results/recommendations.

## 2023-08-04 NOTE — Telephone Encounter (Signed)
 I called and spoke with Maria Evans about her MRI. We will hold off on PT for now. Recheck with me at the end of next week. Backup plan for ortho consult if needed if not doing well.  Recommend consider a walker.

## 2023-08-04 NOTE — Progress Notes (Signed)
 This MRI shows much more injury than I originally thought.  You have multiple fractures in the pelvis which is obviously why you are hurting.  These are not visible on x-ray.  Very limited weightbearing using a walker or crutches.  I am going to consult with an orthopedic surgery colleague of mine to make sure that we are treating you correctly.  I do not think you are going to need surgery but I want to be doubly sure.

## 2023-08-06 ENCOUNTER — Encounter: Payer: Self-pay | Admitting: Nurse Practitioner

## 2023-08-06 LAB — CYTOLOGY - PAP
Comment: NEGATIVE
Diagnosis: NEGATIVE
High risk HPV: NEGATIVE

## 2023-08-07 ENCOUNTER — Ambulatory Visit: Payer: Commercial Managed Care - PPO | Admitting: Family Medicine

## 2023-08-11 ENCOUNTER — Other Ambulatory Visit (HOSPITAL_COMMUNITY): Payer: Self-pay

## 2023-08-11 ENCOUNTER — Ambulatory Visit: Payer: Commercial Managed Care - PPO | Admitting: Family Medicine

## 2023-08-11 ENCOUNTER — Other Ambulatory Visit: Payer: Self-pay

## 2023-08-11 ENCOUNTER — Ambulatory Visit (INDEPENDENT_AMBULATORY_CARE_PROVIDER_SITE_OTHER): Payer: Commercial Managed Care - PPO

## 2023-08-11 VITALS — BP 122/80 | HR 94

## 2023-08-11 DIAGNOSIS — M25511 Pain in right shoulder: Secondary | ICD-10-CM

## 2023-08-11 DIAGNOSIS — G8929 Other chronic pain: Secondary | ICD-10-CM | POA: Diagnosis not present

## 2023-08-11 MED ORDER — MELOXICAM 15 MG PO TABS
ORAL_TABLET | ORAL | 1 refills | Status: DC
  Filled 2023-08-11: qty 30, 30d supply, fill #0
  Filled 2023-10-27: qty 30, 30d supply, fill #1

## 2023-08-11 MED ORDER — OXYCODONE-ACETAMINOPHEN 5-325 MG PO TABS
1.0000 | ORAL_TABLET | Freq: Three times a day (TID) | ORAL | 0 refills | Status: DC | PRN
Start: 2023-08-11 — End: 2023-08-20
  Filled 2023-08-11: qty 15, 5d supply, fill #0

## 2023-08-11 NOTE — Patient Instructions (Addendum)
Thanks for coming in today.  Xray today before you leave.  Use oxycodone for severe pain.   Adjust your insulin for the steroids.

## 2023-08-11 NOTE — Progress Notes (Signed)
Maria Payor, PhD, LAT, ATC acting as a scribe for Maria Graham, MD.  Maria Evans is a 58 y.o. female who presents to [facility] at [facility] today for f/u R shoulder pain. Pt was last seen by Dr. Denyse Amass on 07/31/23 for her pelvis fx.   Today, pt reports crutches irritated her shoulder. Wearing backpack and it started bothering her.  Pt locates pain to right shoulder lateral side that radiates down. After work it got worse.  She works as a Teacher, early years/pre.  Radiates: To the upper arm not past the elbow Aggravates: Shoulder motion Treatments tried: naproxen, tylenol, tramadol, ultram  Pertinent review of systems: No fevers or chills  Relevant historical information: Type 1 diabetes Pelvis fractures  Exam:  BP 122/80   Pulse 94   LMP 02/07/2016   SpO2 100%  General: Well Developed, well nourished, and in no acute distress.   MSK: Right shoulder normal-appearing Very limited range of motion pain with abduction and external rotation. Strength intact within limits of motion although it is painful.  She was guarding for [employer] or [employer] testing. Pulses cap refill and sensation are intact distally.   Lab and Radiology Results  Procedure: Real-time Ultrasound Guided Injection of right shoulder subacromial bursa Device: Philips Affiniti 50G/GE Logiq Images permanently stored and available for review in PACS Ultrasound evaluation prior to injection reveals no large retracted rotator cuff tear Verbal informed consent obtained.  Discussed risks and benefits of procedure. Warned about infection, bleeding, hyperglycemia damage to structures among others. Patient expresses understanding and agreement Time-out conducted.   Noted no overlying erythema, induration, or other signs of local infection.   Skin prepped in a sterile fashion.   Local anesthesia: Topical Ethyl chloride.   With sterile technique and under real time ultrasound guidance: 40 mg of Kenalog and 2 mL  of Marcaine injected into subacromial bursa. Fluid seen entering the bursa.   Completed without difficulty   Pain moderately immediately resolved suggesting accurate placement of the medication.   Advised to call if fevers/chills, erythema, induration, drainage, or persistent bleeding.   Images permanently stored and available for review in the ultrasound unit.  Impression: Technically successful ultrasound guided injection.    X-ray images right shoulder obtained today personally and independently interpreted No acute fractures.  Mild AC DJD. Await formal radiology review   Assessment and Plan: 58 y.o. female with right shoulder pain occurring the setting of pelvic fractures requiring crutches or a walker.  Effectively I think she is overusing that right shoulder and having an exacerbation of rotator cuff impingement tendinitis or bursitis from using the walker or crutches.  She is in a bit of a bind. She should [place] a walker but her shoulder hurts.  Plan for injection today to help with pain control. Rx oxycodone and Meloxicam.      PDMP reviewed during this encounter. Orders Placed This Encounter  Procedures   [place] LIMITED JOINT SPACE STRUCTURES UP RIGHT(NO LINKED CHARGES)    Reason for Exam (SYMPTOM  OR DIAGNOSIS REQUIRED):   right shoulder pain    Preferred imaging location?:   [facility] Shoulder Right    Standing Status:   Future    Number of Occurrences:   1    Expiration Date:   08/10/2024    Reason for Exam (SYMPTOM  OR DIAGNOSIS REQUIRED):   right shoulder pain    Is patient pregnant?:   No    Preferred imaging  location?:   [facility] ordered this encounter  Medications   oxyCODONE-acetaminophen (PERCOCET/ROXICET) 5-325 MG tablet    Sig: Take 1 tablet by mouth every 8 (eight) hours as needed for severe pain (pain score 7-10).    Dispense:  15 tablet    Refill:  0   meloxicam (MOBIC) 15 MG tablet    Sig: One tab PO qAM  with breakfast for 2 weeks, then daily prn pain.    Dispense:  30 tablet    Refill:  1     Discussed warning signs or symptoms. Please see discharge instructions. Patient expresses understanding.   The above documentation has been reviewed and is accurate and complete Maria Evans, M.D.

## 2023-08-14 ENCOUNTER — Ambulatory Visit: Payer: Commercial Managed Care - PPO | Admitting: Family Medicine

## 2023-08-15 ENCOUNTER — Other Ambulatory Visit (HOSPITAL_COMMUNITY): Payer: Self-pay

## 2023-08-15 ENCOUNTER — Encounter: Payer: Self-pay | Admitting: Family Medicine

## 2023-08-15 DIAGNOSIS — M25552 Pain in left hip: Secondary | ICD-10-CM

## 2023-08-15 NOTE — Progress Notes (Signed)
Right hip x-ray does not show fractures that were seen on MRI.

## 2023-08-15 NOTE — Progress Notes (Signed)
Right shoulder x-ray looks normal to radiology.  No arthritis or broken bones.

## 2023-08-18 ENCOUNTER — Other Ambulatory Visit (HOSPITAL_COMMUNITY): Payer: Self-pay

## 2023-08-18 ENCOUNTER — Ambulatory Visit (HOSPITAL_BASED_OUTPATIENT_CLINIC_OR_DEPARTMENT_OTHER): Payer: Commercial Managed Care - PPO | Admitting: Orthopaedic Surgery

## 2023-08-18 DIAGNOSIS — M25551 Pain in right hip: Secondary | ICD-10-CM

## 2023-08-18 DIAGNOSIS — M25351 Other instability, right hip: Secondary | ICD-10-CM

## 2023-08-18 MED ORDER — LIDOCAINE HCL 1 % IJ SOLN
4.0000 mL | INTRAMUSCULAR | Status: AC | PRN
Start: 2023-08-18 — End: 2023-08-18
  Administered 2023-08-18: 4 mL

## 2023-08-18 MED ORDER — TRIAMCINOLONE ACETONIDE 40 MG/ML IJ SUSP
80.0000 mg | INTRAMUSCULAR | Status: AC | PRN
Start: 2023-08-18 — End: 2023-08-18
  Administered 2023-08-18: 80 mg via INTRA_ARTICULAR

## 2023-08-18 NOTE — Progress Notes (Signed)
 Chief Complaint: Bilateral hip pain     History of Present Illness:    Maria Evans is a 58 y.o. female today with ongoing bilateral hip pain since a ski accident in January of this year where she landed directly on her side.  She has been seeing Dr. Denyse Amass for this.  She has been using a walker which has flared up the shoulder.  She has had a shoulder injection as well which has given her relief for the shoulder but is having persistent pain today.  She did recently have right been quite disabling.  She works as a Teacher, early years/pre at [employer].  Have a history of type 1 diabetes.    PMH/PSH/Family History/Social History/Meds/Allergies:    Past Medical History:  Diagnosis Date  . Anxiety   . Anxiety with depression    Controlled.  . Common migraine without aura   . Diabetes mellitus type I, controlled (HCC)    Treated with 4 times daily insulin based on blood sugar monitoring  . Hip sprain    & contusion  . Hyperlipidemia due to type 1 diabetes mellitus (HCC)    Currently on atorvastatin.  Marland Kitchen Hypothyroid   . Lipoma   . MVP (mitral valve prolapse) 2005   Renally diagnosed back in 2005 (during her pregnancy)_, most recently evaluated in 2012.  [facility] - [facility], Dr. Emily Filbert); 11/2010 TTE: Moderate MR, posteriorly directed (recommend TEE) => TEE (01/2011) - mild MVP (anterior leaflet) with Mild-Mod MR.;    Past Surgical History:  Procedure Laterality Date  . LAPAROSCOPIC LYSIS OF ADHESIONS    . LIPOMA EXCISION Left   . LUMBAR MICRODISCECTOMY    . TRANSESOPHAGEAL ECHOCARDIOGRAM  02/13/2011   [facility] -> Dr. Emily Filbert [facility]): EF 55 to 60%.  Borderline LA dilation.  Prolapse of A2 scallop of mitral valve-mild MVP with mild to moderate MR.  (2 eccentric jets, posteriorly directed, c/w anterior leaflet pathology))  . TRANSTHORACIC ECHOCARDIOGRAM  12/11/2010   [facility] -> Dr. Emily Filbert  [facility]): EF 35 to 6%.  Mild LA dilation.  Moderate MR-eccentric/possibly directed jet consistent with antilipid pathology.  Mild TR, LP HTN, mild PR.  Recommend TEE.  Marland Kitchen TREADMILL STRESS ECHOCARDIOGRAM     [facility] -> Dr. Emily Filbert [facility]-Card[facility]): Exercised 10 minutes (11 minutes), achieved Max Heart Rate 162 = 92% Max Predicted Heart Rate, clinically and electrocardiographically negative.  Nonspecific ST and T wave changes.  Prestress echo showed EF 55 to 60% with normal wall motion.  Post-Stress: EF 65 to 70% with nl augmentation of all walls.=> NORMAL STRESS [facility]   Social History   Socioeconomic History  . Marital status: Married    Spouse name: Not on file  . Number of children: 1  . Years of education: Not on file  . Highest education level: Not on file  Occupational History  . Occupation: Hospital pharmacist     Employer: [employer]  Tobacco Use  . Smoking status: Never  . Smokeless tobacco: Never  Vaping Use  . Vaping status: Never Used  Substance and Sexual Activity  . Alcohol use: Yes    Comment: occ, wine or beer  . Drug use: Never  . Sexual activity: Yes    Partners: Male    Birth control/protection: Post-menopausal  Other Topics Concern  . Not on file  Social History Narrative   She is currently divorced mother of 88 (39 year old son).  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    She is now with her longtime high school significant other after completing the divorce from her first husband.     She and her fianc intend to elope probably in May or June of this year.      She is an avid exerciser.  Enjoys jogging and walking.  She does not list 2 miles with treadmill and walks about 45 minutes most days - either indoors on the treadmill or outside.          Social Drivers of Corporate investment banker Strain: Not on file  Food Insecurity: Not on file  Transportation Needs: Not on file  Physical Activity: Not on file  Stress: Not on file  Social Connections: Not on file   Family History  Problem Relation Age of Onset  . Hyperlipidemia Mother   . Hyperlipidemia Father   . Heart Problems Father   . Diabetes Brother   . Esophageal cancer Maternal Grandfather        Long-term smoker  . CAD Paternal Grandmother        Long-term smoker  . Heart attack Paternal Grandmother 29       Cause of death  . Stroke Paternal Grandmother    Allergies  Allergen Reactions  . Nitrofurantoin Itching and Other (See Comments)  . Other     Squash- hives, itching   Current Outpatient Medications  Medication Sig Dispense Refill  . Acetaminophen (TYLENOL PO) Take by mouth as needed.    . ALPRAZolam (XANAX) 0.5 MG tablet Take 1 tablet (0.5 mg total) by mouth daily as needed. 30 tablet 0  . atorvastatin (LIPITOR) 20 MG tablet Take 1 tablet (20 mg total) by mouth every other day. 90 tablet 1  . Blood Pressure Monitoring (OMRON 3 SERIES BP MONITOR) DEVI Use as directed 1 each 0  . Continuous Blood Gluc Receiver (DEXCOM G6 RECEIVER) DEVI Use to check blood sugar 4 times daily 1 each 0  . Continuous Glucose Sensor (DEXCOM G6 SENSOR) MISC USE WITH DEXCOM TO CHECK BLOOD SUGAR 4 TIMES DAILY 9 each 3  . Continuous Glucose Transmitter (DEXCOM G6 TRANSMITTER) MISC Use as instructed to check blood sugar change every 90 days 1 each 3  . cyclobenzaprine (FLEXERIL) 10 MG tablet Take 1 tablet (10 mg total) by mouth at bedtime as needed. 60 tablet 1  . escitalopram (LEXAPRO) 20 MG tablet Take 1 tablet (20 mg total) by mouth daily. 90 tablet 3  . fluocinonide gel (LIDEX) 0.05 % Apply Externally Twice a day as needed for flare ups 30 g 1  . Glucagon 3 MG/DOSE POWD Place 3 mg into the nose once as needed for up to 1 dose. 1 each 11  . glucose blood (FREESTYLE TEST STRIPS)  test strip Use as instructed 1-2 times a day 100 each 12  . GVOKE HYPOPEN 1-PACK 1 MG/0.2ML SOAJ Inject 0.2 mLs into the skin as needed. 0.2 mL PRN  . insulin degludec (TRESIBA FLEXTOUCH) 100 UNIT/ML FlexTouch Pen Inject up to 13 Units into the skin daily. (Patient taking differently: Inject into the skin daily. 11-13 units) 9 mL 3  . Insulin Lispro-aabc (LYUMJEV KWIKPEN) 100 UNIT/ML KwikPen Inject up to 8 units before each meal 3-4 times a day (Patient taking differently: Inject up to 11 units before each meal  3-4 times a day) 15 mL 11  . Insulin NPH, Human,, Isophane, (HUMULIN N KWIKPEN) 100 UNIT/ML Kiwkpen Inject 2-4 Units into the skin daily in the afternoon. 15 mL 11  . Insulin Pen Needle (TECHLITE PLUS PEN NEEDLES) 32G X 4 MM MISC use as directed 4-6 times daily 300 each 3  . levocetirizine (XYZAL) 5 MG tablet Take 1 tablet (5 mg total) by mouth every evening. 30 tablet 3  . levothyroxine (SYNTHROID) 100 MCG tablet Take 1 tablet (100 mcg total) by mouth daily before breakfast. 90 tablet 3  . lisinopril (ZESTRIL) 2.5 MG tablet Take 1 tablet (2.5 mg total) by mouth daily. 90 tablet 0  . meloxicam (MOBIC) 15 MG tablet Take 1 tablet (15 mg total) by mouth daily with breakfast for 14 days, THEN 1 tablet (15 mg total) daily as needed. 30 tablet 1  . Multiple Vitamin (MULTIVITAMIN) capsule Take 1 capsule by mouth daily.    . Naproxen (NAPROSYN PO) Take by mouth.    . oxyCODONE-acetaminophen (PERCOCET/ROXICET) 5-325 MG tablet Take 1 tablet by mouth every 8 (eight) hours as needed for severe pain (pain score 7-10). 15 tablet 0  . traMADol (ULTRAM) 50 MG tablet Take 1 tablet (50 mg total) by mouth every 8 (eight) hours as needed for severe pain (pain score 7-10). 15 tablet 0  . valACYclovir (VALTREX) 1000 MG tablet Take 1,000 mg by mouth as needed (fever blisters).     No current facility-administered medications for this visit.   No results found.  Review of Systems:   A ROS was performed including  pertinent positives and negatives as documented in the HPI.  Physical Exam :   Constitutional: NAD and appears stated age Neurological: Alert and oriented Psych: Appropriate affect and cooperative Last menstrual period 02/07/2016.   Comprehensive Musculoskeletal Exam:    Bilateral hip with mildly positive FADIR with laxity and external rotation consistent with capsular insufficiency.  Otherwise range of motion is 40 degrees internal/external rotation of the right hip.  No pain with compression about the iliac crests.  Distal neurosensory exam is intact bilaterally.  Walks with mild antalgic gait   Imaging:   Xray (3 views pelvis): Normal  MRI (pelvis): Intact femoral acetabular joint with nondisplaced lateral compression type mechanism   I personally reviewed and interpreted the radiographs.   Assessment and Plan:   58 y.o. female with bilateral hip pain which was initially consistent with a lateral compression type injury.  At this time she is having persistent pain which I do believe may be related to hip instability right worse than left.  At this time I did recommend that we begin with an injection of the right femoral acetabular joint to see if this does indeed give her near complete symptom relief.  Would like to engage her with physical therapy to start working on the core and the hips.  Will plan to begin with this.  I would like to start with stage injections due to her history of type 1 diabetes.  I will plan to see her back for follow-up in 3 weeks so we can discuss her progress and to assess the left hip as well  -Right hip femoral acetabular injection provided after verbal consent obtained  Procedure Note  Patient: Maria Evans             Date of Birth: 05/07/1966           MRN: [id number]  Visit Date: 08/18/2023  Procedures: Visit Diagnoses: No diagnosis found.  Large Joint Inj: R hip joint on 08/18/2023 10:48 AM Indications: pain Details: 22 G 3.5 in  needle, ultrasound-guided anterolateral approach  Arthrogram: No  Medications: 4 mL lidocaine 1 %; 80 mg triamcinolone acetonide 40 MG/ML Outcome: tolerated well, no immediate complications Procedure, treatment alternatives, risks and benefits explained, specific risks discussed. Consent was given by the patient. Immediately prior to procedure a time out was called to verify the correct patient, procedure, equipment, support staff and site/side marked as required. Patient was prepped and draped in the usual sterile fashion.        I personally saw and evaluated the patient, and participated in the management and treatment plan.  Huel Cote, MD Attending Physician, Orthopedic Surgery  This document was dictated using Dragon voice recognition software. A reasonable attempt at proof reading has been made to minimize errors.

## 2023-08-20 ENCOUNTER — Other Ambulatory Visit (HOSPITAL_COMMUNITY): Payer: Self-pay

## 2023-08-20 MED ORDER — OXYCODONE-ACETAMINOPHEN 5-325 MG PO TABS
1.0000 | ORAL_TABLET | Freq: Three times a day (TID) | ORAL | 0 refills | Status: DC | PRN
  Filled 2023-08-20: qty 15, 5d supply, fill #0

## 2023-08-20 NOTE — Addendum Note (Signed)
 Addended by: Dierdre Searles on: 08/20/2023 08:12 AM   Modules accepted: Orders

## 2023-08-21 ENCOUNTER — Ambulatory Visit (HOSPITAL_BASED_OUTPATIENT_CLINIC_OR_DEPARTMENT_OTHER): Payer: Commercial Managed Care - PPO | Attending: Orthopaedic Surgery | Admitting: Physical Therapy

## 2023-08-21 ENCOUNTER — Other Ambulatory Visit: Payer: Self-pay

## 2023-08-21 ENCOUNTER — Encounter (HOSPITAL_BASED_OUTPATIENT_CLINIC_OR_DEPARTMENT_OTHER): Payer: Self-pay | Admitting: Physical Therapy

## 2023-08-21 DIAGNOSIS — M25351 Other instability, right hip: Secondary | ICD-10-CM | POA: Insufficient documentation

## 2023-08-21 DIAGNOSIS — M25551 Pain in right hip: Secondary | ICD-10-CM | POA: Insufficient documentation

## 2023-08-21 DIAGNOSIS — R262 Difficulty in walking, not elsewhere classified: Secondary | ICD-10-CM | POA: Insufficient documentation

## 2023-08-21 DIAGNOSIS — M6281 Muscle weakness (generalized): Secondary | ICD-10-CM | POA: Insufficient documentation

## 2023-08-21 DIAGNOSIS — G8929 Other chronic pain: Secondary | ICD-10-CM | POA: Diagnosis not present

## 2023-08-21 DIAGNOSIS — M25512 Pain in left shoulder: Secondary | ICD-10-CM | POA: Insufficient documentation

## 2023-08-21 DIAGNOSIS — M25511 Pain in right shoulder: Secondary | ICD-10-CM | POA: Diagnosis not present

## 2023-08-21 DIAGNOSIS — R293 Abnormal posture: Secondary | ICD-10-CM | POA: Insufficient documentation

## 2023-08-21 NOTE — Therapy (Signed)
 OUTPATIENT PHYSICAL THERAPY EVALUATION   Patient Name: ALEE GRESSMAN MRN: [id number] DOB:Mar 20, 1966, 58 y.o., female Today's Date: 08/21/2023  END OF SESSION:  PT End of Session - 08/21/23 1203     Visit Number 1    Number of Visits 25    Date for PT Re-Evaluation 11/15/23    Authorization Type [facility] [employer]    PT Start Time 1015    PT Stop Time 1100    PT Time Calculation (min) 45 min    Activity Tolerance Patient tolerated treatment well    Behavior During Therapy WFL for tasks assessed/performed             Past Medical History:  Diagnosis Date   Anxiety    Anxiety with depression    Controlled.   Common migraine without aura    Diabetes mellitus type I, controlled (HCC)    Treated with 4 times daily insulin based on blood sugar monitoring   Hip sprain    & contusion   Hyperlipidemia due to type 1 diabetes mellitus (HCC)    Currently on atorvastatin.   Hypothyroid    Lipoma    MVP (mitral valve prolapse) 2005   Renally diagnosed back in 2005 (during her pregnancy)_, most recently evaluated in 2012.  [facility] - Cardiology, Dr. Emily Filbert); 11/2010 TTE: Moderate MR, posteriorly directed (recommend TEE) => TEE (01/2011) - mild MVP (anterior leaflet) with Mild-Mod MR.;    Past Surgical History:  Procedure Laterality Date   LAPAROSCOPIC LYSIS OF ADHESIONS     LIPOMA EXCISION Left    LUMBAR MICRODISCECTOMY     TRANSESOPHAGEAL ECHOCARDIOGRAM  02/13/2011   [facility] -> Dr. Emily Filbert [facility]-Cardiology): EF 55 to 60%.  Borderline LA dilation.  Prolapse of A2 scallop of mitral valve-mild MVP with mild to moderate MR.  (2 eccentric jets, posteriorly directed, c/w anterior leaflet pathology))   TRANSTHORACIC ECHOCARDIOGRAM  12/11/2010   [facility] -> Dr. Emily Filbert [facility]-Cardiology): EF 35 to 6%.  Mild LA dilation.  Moderate MR-eccentric/possibly directed jet consistent with antilipid  pathology.  Mild TR, LP HTN, mild PR.  Recommend TEE.   TREADMILL STRESS ECHOCARDIOGRAM     [facility] -> Dr. Emily Filbert [facility]-Cardiology): Exercised 10 minutes (11 minutes), achieved Max Heart Rate 162 = 92% Max Predicted Heart Rate, clinically and electrocardiographically negative.  Nonspecific ST and T wave changes.  Prestress echo showed EF 55 to 60% with normal wall motion.  Post-Stress: EF 65 to 70% with nl augmentation of all walls.=> NORMAL STRESS [facility]   Patient Active Problem List   Diagnosis Date Noted   Hypothyroidism 11/09/2021   Type 1 diabetes mellitus with hyperglycemia (HCC) 08/22/2020   History of mitral valve prolapse in adulthood 08/23/2010     REFERRING PROVIDER:  Huel Cote, MD     REFERRING DIAG: M25.351 (ICD-10-CM) - Hip instability, right   Rationale for Evaluation and Treatment: Rehabilitation  THERAPY DIAG:  Pain in right hip  Difficulty in walking, not elsewhere classified  Muscle weakness (generalized)  Abnormal posture  Acute pain of right shoulder  Chronic left shoulder pain  ONSET DATE: 07/21/23   SUBJECTIVE:  SUBJECTIVE STATEMENT: T1DM and maintain a good AI1. 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 I like to go sailing and skiing, enjoys trail walking around [facility]. Has 4 dogs and husband is an avid exerciser. I have had multiple LE fx but have been trauma-related. Bilat 5th mets, Rt ankle fx 2 locations. Has had back surgery at L4-5 discectomy. Jan 27th went on a family trip- skiing on new equipment- fell back, more on right side with bruise on Rt shoulder. I think the back of my skiis crossed and opened anteriorly and knees splayed out to strain groin muscles. Bindings did not release in fall. Hauled out by ski patrol and was in  [facility] ER xrays denied fx and sent me out on crutches.  After a week or 2 was not improving and tailbone felt like it was on fire. MRI showed bilat fractures, including sacrum. Did not miss a day at work and was unable to tolerate pain on walker. Developed tendonitis in right shoulder walking around the hospital on crutches. The 17th of Feb, Rt shoulder steroid injection. Discovered hip instability when I stepped into the tub-shower with right foot and felt something shift. When I rolled back to put my socks on, both hips shifted/popped/caught and had 10/10 pain and spent the day in bed. Dr B gave me a steroid shot in right hip on Monday and have had dramatic improvement. Was able to tolerate laying on her side for about 15 min last night. Walked short distances at home without RW.   PERTINENT HISTORY:  H/o ankle fx without surgery. T1DM Lt shoulder bursitis with PT in the past Injection to Rt shoulder recently for tendonitis   PAIN:  Are you having pain? Yes: NPRS scale: 1 lateral let, tailbone 3, overall uncomfortable, no pain walking in Pain location: Rt hip, tailbone, Lateral Lt thigh sensation Pain description: I feel a sensation down lateral Lt thigh Aggravating factors: walking Relieving factors: reduced WB, injection  PRECAUTIONS:  None  RED FLAGS: None   WEIGHT BEARING RESTRICTIONS:  WBAT  FALLS:  Has patient fallen in last 6 months? Yes. 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  DIAGNOSTIC FINDINGS:  DG hip 08/14/23: Fractures of the bilateral puboacetabular junction, inferior pubic rami and right sacrum a MRI are not well-defined by radiograph. The right inferior ramus fracture is potentially visualized, the other fractures are radiographically occult. There is no hip dislocation. No pubic  symphyseal or sacroiliac diastasis.  PATIENT SURVEYS:  LEFS 18  COGNITIVE STATUS: Within functional limits for tasks assessed   SENSATION: WFL  POSTURE:  Standing: Rt shoulder depression, decreased thoracic kyphosis & lumbar lordosis, notable pelvic rotation Supine: Lt leg functionally shorter, Rt anterior innom  HAND DOMINANCE:  Right  GAIT: Eval: RW, slow cadence, short stride  TREATMENT DATE:   Treatment                            2/27: Blank lines following charge title = not provided on this treatment date.   Manual:  TPDN No  There-ex: Hesch self correction for Rt ant innominate Ab set with ball squeeze Added GHJ flexion, cues for breathing There-Act:  Self Care: See edu Nuro-Re-ed:  Gait Training:     PATIENT EDUCATION:  Education details: Teacher, music of condition, POC, HEP, exercise form/rationale Person educated: Patient Education method: Explanation, Demonstration, Tactile cues, Verbal cues, and Handouts Education comprehension: verbalized understanding, returned demonstration, verbal cues required, tactile cues required, and needs further education  HOME EXERCISE PROGRAM: [id number] Hesch self correction for Rt anterior innominate rotation   ASSESSMENT:  CLINICAL IMPRESSION: Patient is a 58 y.o. F who was seen today for physical therapy evaluation and treatment for Rt hip pain following an incident on skiis that resulted in strained ER through bilateral LEs. Multiple pelvic fractures and strain through adductors, history of lumbar spine surgical intervention all contributing to pain. Pt does have known tearing in hip labrum but is feeling much better since the injection and would like to avoid surgery if possible.  Pt was very active prior to the accident and would like to return to PLOF. Pt also notes Rt shoulder pain onset  since the fall- notable postural changes through thoracic spine creating biomechanical chain input from pelvis to shoulder.     REHAB POTENTIAL: Good  CLINICAL DECISION MAKING: Stable/uncomplicated  EVALUATION COMPLEXITY: Low   GOALS: Goals reviewed with patient? Yes  SHORT TERM GOALS: Target date: 3/22  Able to ambulate household distances, without AD, without antalgic pattern Baseline: Goal status: INITIAL  2.  Demo proper core control with exercises Baseline:  Goal status: INITIAL    LONG TERM GOALS: Target date: POC date  Hip strength 90% of left side Baseline: MMT to be performed as appropriate Goal status: INITIAL  2.  Progressing into plyometric motions with minimal to no pain Baseline:  Goal status: INITIAL  3.  Return to work with understanding of exercises & rest breaks to reduce discomfort Baseline:  Goal status: INITIAL  4.  Navigate uneven surfaces with minimal to no pain Baseline:  Goal status: INITIAL     PLAN:  PT FREQUENCY: 1-2x/week  PT DURATION: 12 weeks  PLANNED INTERVENTIONS: 97164- PT Re-evaluation, 97110-Therapeutic exercises, 97530- Therapeutic activity, 97112- Neuromuscular re-education, 97535- Self Care, 54098- Manual therapy, [phone]- Gait training, [phone]- Aquatic Therapy, [phone]- Electrical stimulation (unattended), Patient/Family education, Balance training, Stair training, Taping, Dry Needling, Joint mobilization, Spinal mobilization, Cryotherapy, and Moist heat.  PLAN FOR NEXT SESSION: recheck innominate rotation, consider aquatics? Gentle lumbopelvic stability.    Matai Carpenito C. Mong Neal PT, DPT 08/21/23 4:54 PM

## 2023-08-22 ENCOUNTER — Encounter (HOSPITAL_BASED_OUTPATIENT_CLINIC_OR_DEPARTMENT_OTHER): Payer: Commercial Managed Care - PPO

## 2023-08-22 ENCOUNTER — Encounter (HOSPITAL_BASED_OUTPATIENT_CLINIC_OR_DEPARTMENT_OTHER): Payer: Self-pay

## 2023-08-22 ENCOUNTER — Ambulatory Visit (HOSPITAL_BASED_OUTPATIENT_CLINIC_OR_DEPARTMENT_OTHER): Payer: Commercial Managed Care - PPO

## 2023-08-22 DIAGNOSIS — M25511 Pain in right shoulder: Secondary | ICD-10-CM

## 2023-08-22 DIAGNOSIS — M25512 Pain in left shoulder: Secondary | ICD-10-CM | POA: Diagnosis not present

## 2023-08-22 DIAGNOSIS — M25551 Pain in right hip: Secondary | ICD-10-CM

## 2023-08-22 DIAGNOSIS — R293 Abnormal posture: Secondary | ICD-10-CM

## 2023-08-22 DIAGNOSIS — M6281 Muscle weakness (generalized): Secondary | ICD-10-CM

## 2023-08-22 DIAGNOSIS — M25351 Other instability, right hip: Secondary | ICD-10-CM | POA: Diagnosis not present

## 2023-08-22 DIAGNOSIS — G8929 Other chronic pain: Secondary | ICD-10-CM | POA: Diagnosis not present

## 2023-08-22 DIAGNOSIS — R262 Difficulty in walking, not elsewhere classified: Secondary | ICD-10-CM | POA: Diagnosis not present

## 2023-08-22 NOTE — Therapy (Signed)
 OUTPATIENT PHYSICAL THERAPY EVALUATION   Patient Name: Maria Evans MRN: [id number] DOB:1966/06/06, 58 y.o., female Today's Date: 08/22/2023  END OF SESSION:  PT End of Session - 08/22/23 1457     Visit Number 2    Number of Visits 25    Date for PT Re-Evaluation 11/15/23    Authorization Type [facility] [employer]    PT Start Time 1350    PT Stop Time 1430    PT Time Calculation (min) 40 min    Activity Tolerance Patient tolerated treatment well    Behavior During Therapy WFL for tasks assessed/performed              Past Medical History:  Diagnosis Date   Anxiety    Anxiety with depression    Controlled.   Common migraine without aura    Diabetes mellitus type I, controlled (HCC)    Treated with 4 times daily insulin based on blood sugar monitoring   Hip sprain    & contusion   Hyperlipidemia due to type 1 diabetes mellitus (HCC)    Currently on atorvastatin.   Hypothyroid    Lipoma    MVP (mitral valve prolapse) 2005   Renally diagnosed back in 2005 (during her pregnancy)_, most recently evaluated in 2012.  [facility] - Cardiology, Dr. Emily Filbert); 11/2010 TTE: Moderate MR, posteriorly directed (recommend TEE) => TEE (01/2011) - mild MVP (anterior leaflet) with Mild-Mod MR.;    Past Surgical History:  Procedure Laterality Date   LAPAROSCOPIC LYSIS OF ADHESIONS     LIPOMA EXCISION Left    LUMBAR MICRODISCECTOMY     TRANSESOPHAGEAL ECHOCARDIOGRAM  02/13/2011   [facility] -> Dr. Emily Filbert [facility]-Cardiology): EF 55 to 60%.  Borderline LA dilation.  Prolapse of A2 scallop of mitral valve-mild MVP with mild to moderate MR.  (2 eccentric jets, posteriorly directed, c/w anterior leaflet pathology))   TRANSTHORACIC ECHOCARDIOGRAM  12/11/2010   [facility] -> Dr. Emily Filbert [facility]-Cardiology): EF 35 to 6%.  Mild LA dilation.  Moderate MR-eccentric/possibly directed jet consistent with antilipid  pathology.  Mild TR, LP HTN, mild PR.  Recommend TEE.   TREADMILL STRESS ECHOCARDIOGRAM     [facility] -> Dr. Emily Filbert [facility] Physicians-Cardiology): Exercised 10 minutes (11 minutes), achieved Max Heart Rate 162 = 92% Max Predicted Heart Rate, clinically and electrocardiographically negative.  Nonspecific ST and T wave changes.  Prestress echo showed EF 55 to 60% with normal wall motion.  Post-Stress: EF 65 to 70% with nl augmentation of all walls.=> NORMAL STRESS [facility]   Patient Active Problem List   Diagnosis Date Noted   Hypothyroidism 11/09/2021   Type 1 diabetes mellitus with hyperglycemia (HCC) 08/22/2020   History of mitral valve prolapse in adulthood 08/23/2010     REFERRING PROVIDER:  Huel Cote, MD     REFERRING DIAG: M25.351 (ICD-10-CM) - Hip instability, right   Rationale for Evaluation and Treatment: Rehabilitation  THERAPY DIAG:  Pain in right hip  Difficulty in walking, not elsewhere classified  Abnormal posture  Muscle weakness (generalized)  Acute pain of right shoulder  Chronic left shoulder pain  ONSET DATE: 07/21/23   SUBJECTIVE:  SUBJECTIVE STATEMENT:  Pt reports she forgot to take her percocet today, but took some ibuprofen on the way here. 3/10 pain level in sacrum. Arrives with FWW. Has tried to go without it inside the house, which is going well. Compliant with HEP. States she had to decrease range with supine shoulder flexion due to catching in R shoulder.    Eval: T1DM and maintain a good AI1. 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 Has 4 dogs and husband is an avid exerciser. I have had multiple LE fx but have been trauma-related. Bilat 5th mets, Rt ankle fx 2 locations. Has  had back surgery at L4-5 discectomy. Jan 27th went on a family trip- skiing on new equipment- fell back, more on right side with bruise on Rt shoulder. I think the back of my skiis crossed and opened anteriorly and knees splayed out to strain groin muscles. Bindings did not release in fall. Hauled out by ski patrol and was in [facility]- ER xrays denied fx and sent me out on crutches.  After a week or 2 was not improving and tailbone felt like it was on fire. MRI showed bilat fractures, including sacrum. Did not miss a day at work and was unable to tolerate pain on walker. Developed tendonitis in right shoulder walking around the hospital on crutches. The 17th of Feb, Rt shoulder steroid injection. Discovered hip instability when I stepped into the tub-shower with right foot and felt something shift. When I rolled back to put my socks on, both hips shifted/popped/caught and had 10/10 pain and spent the day in bed. Dr B gave me a steroid shot in right hip on Monday and have had dramatic improvement. Was able to tolerate laying on her side for about 15 min last night. Walked short distances at home without RW.   PERTINENT HISTORY:  H/o ankle fx without surgery. T1DM Lt shoulder bursitis with PT in the past Injection to Rt shoulder recently for tendonitis   PAIN:  Are you having pain? Yes: NPRS scale: 1 lateral let, tailbone 3, overall uncomfortable, no pain walking in Pain location: Rt hip, tailbone, Lateral Lt thigh sensation Pain description: I feel a sensation down lateral Lt thigh Aggravating factors: walking Relieving factors: reduced WB, injection  PRECAUTIONS:  None  RED FLAGS: None   WEIGHT BEARING RESTRICTIONS:  WBAT  FALLS:  Has patient fallen in last 6 months? Yes. 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  DIAGNOSTIC FINDINGS:  DG hip 08/14/23: Fractures of the bilateral puboacetabular junction, inferior pubic rami and right sacrum a MRI are not well-defined by radiograph. The right inferior ramus fracture is potentially visualized, the other fractures are radiographically occult. There is no hip dislocation. No pubic symphyseal or sacroiliac diastasis.  PATIENT SURVEYS:  LEFS 18  COGNITIVE STATUS: Within functional limits for tasks assessed   SENSATION: WFL  POSTURE:  Standing: Rt shoulder depression, decreased thoracic kyphosis & lumbar lordosis, notable pelvic rotation Supine: Lt leg functionally shorter, Rt anterior innom  HAND DOMINANCE:  Right  GAIT: Eval: RW, slow cadence, short stride    TREATMENT DATE:   Treatment  2/28: Blank lines following charge title = not provided on this treatment date.   Manual:  TPDN No STM to bilateral proximal adductors  PROM bil shoulders  Gentle R GHJ distraction   There-ex: Glute+ab set 5" x70min (had some sacral discomfort after)                                                                                                                             Treatment                            2/27: Blank lines following charge title = not provided on this treatment date.   Manual:  TPDN No  There-ex: Hesch self correction for Rt ant innominate Ab set with ball squeeze Added GHJ flexion, cues for breathing There-Act:  Self Care: See edu Nuro-Re-ed:  Gait Training:     PATIENT EDUCATION:  Education details: Teacher, music of condition, POC, HEP, exercise form/rationale Person educated: Patient Education method: Explanation, Demonstration, Tactile cues, Verbal cues, and Handouts Education comprehension: verbalized understanding, returned demonstration, verbal cues required, tactile cues required, and needs further education  HOME EXERCISE PROGRAM: [id number] Hesch self correction for Rt  anterior innominate rotation   ASSESSMENT:  CLINICAL IMPRESSION:  Focused on manual interventions today with good tolerance. She does demonstrate significant tightness in bilateral proximal adductors, so spent time here on gentle STM. Performed glute/ab set with pt reported some increased sacral pain following this. With shoulder PROM she improved with passive R movement, though still painful with active.  With HEP she was instructed to avoid pushing past pain limitations. At end of session, she had some weakness with sit to stand transfer due to LE weakness. Able to stand independently after a couple of trials.   Eval: Patient is a 58 y.o. F who was seen today for physical therapy evaluation and treatment for Rt hip pain following an incident on skiis that resulted in strained ER through bilateral LEs. Multiple pelvic fractures and strain through adductors, history of lumbar spine surgical intervention all contributing to pain. Pt does have known tearing in hip labrum but is feeling much better since the injection and would like to avoid surgery if possible.  Pt was very active prior to the accident and would like to return to PLOF. Pt also notes Rt shoulder pain onset since the fall- notable postural changes through thoracic spine creating biomechanical chain input from pelvis to shoulder.     REHAB POTENTIAL: Good  CLINICAL DECISION MAKING: Stable/uncomplicated  EVALUATION COMPLEXITY: Low   GOALS: Goals reviewed with patient? Yes  SHORT TERM GOALS: Target date: 3/22  Able to ambulate household distances, without AD, without antalgic pattern Baseline: Goal status: INITIAL  2.  Demo proper core control with exercises Baseline:  Goal status: INITIAL    LONG TERM GOALS: Target date: POC date  Hip strength 90% of left side Baseline: MMT to be performed as  appropriate Goal status: INITIAL  2.  Progressing into plyometric motions with minimal to no pain Baseline:  Goal status:  INITIAL  3.  Return to work with understanding of exercises & rest breaks to reduce discomfort Baseline:  Goal status: INITIAL  4.  Navigate uneven surfaces with minimal to no pain Baseline:  Goal status: INITIAL     PLAN:  PT FREQUENCY: 1-2x/week  PT DURATION: 12 weeks  PLANNED INTERVENTIONS: 97164- PT Re-evaluation, 97110-Therapeutic exercises, 97530- Therapeutic activity, 97112- Neuromuscular re-education, 97535- Self Care, 16109- Manual therapy, [phone]- Gait training, [phone]- Aquatic Therapy, [phone]- Electrical stimulation (unattended), Patient/Family education, Balance training, Stair training, Taping, Dry Needling, Joint mobilization, Spinal mobilization, Cryotherapy, and Moist heat.  PLAN FOR NEXT SESSION: recheck innominate rotation, consider aquatics? Gentle lumbopelvic stability.     Donnel Saxon Henryetta Corriveau, PTA 08/22/2023, 3:12 PM

## 2023-08-25 NOTE — Therapy (Signed)
 OUTPATIENT PHYSICAL THERAPY TREATMENT   Patient Name: Maria Evans MRN: [id number] DOB:1965/08/02, 58 y.o., female Today's Date: 08/26/2023  END OF SESSION:  PT End of Session - 08/26/23 1450     Visit Number 3    Number of Visits 25    Date for PT Re-Evaluation 11/15/23    Authorization Type [facility] [employer]    PT Start Time 1450   late check in   PT Stop Time 1520    PT Time Calculation (min) 30 min    Activity Tolerance Patient tolerated treatment well               Past Medical History:  Diagnosis Date   Anxiety    Anxiety with depression    Controlled.   Common migraine without aura    Diabetes mellitus type I, controlled (HCC)    Treated with 4 times daily insulin based on blood sugar monitoring   Hip sprain    & contusion   Hyperlipidemia due to type 1 diabetes mellitus (HCC)    Currently on atorvastatin.   Hypothyroid    Lipoma    MVP (mitral valve prolapse) 2005   Renally diagnosed back in 2005 (during her pregnancy)_, most recently evaluated in 2012.  [facility] Phys[facility] - Cardiology, Dr. Emily Filbert); 11/2010 TTE: Moderate MR, posteriorly directed (recommend TEE) => TEE (01/2011) - mild MVP (anterior leaflet) with Mild-Mod MR.;    Past Surgical History:  Procedure Laterality Date   LAPAROSCOPIC LYSIS OF ADHESIONS     LIPOMA EXCISION Left    LUMBAR MICRODISCECTOMY     TRANSESOPHAGEAL ECHOCARDIOGRAM  02/13/2011   [facility] -> Dr. Emily Filbert [facility] Physicians-Cardiology): EF 55 to 60%.  Borderline LA dilation.  Prolapse of A2 scallop of mitral valve-mild MVP with mild to moderate MR.  (2 eccentric jets, posteriorly directed, c/w anterior leaflet pathology))   TRANSTHORACIC ECHOCARDIOGRAM  12/11/2010   [facility] -> Dr. Emily Filbert [facility] Physicians-Cardiology): EF 35 to 6%.  Mild LA dilation.  Moderate MR-eccentric/possibly directed jet consistent with antilipid pathology.  Mild TR, LP HTN, mild PR.   Recommend TEE.   TREADMILL STRESS ECHOCARDIOGRAM     [facility] -> Dr. Emily Filbert [facility] Physicians-Cardiology): Exercised 10 minutes (11 minutes), achieved Max Heart Rate 162 = 92% Max Predicted Heart Rate, clinically and electrocardiographically negative.  Nonspecific ST and T wave changes.  Prestress echo showed EF 55 to 60% with normal wall motion.  Post-Stress: EF 65 to 70% with nl augmentation of all walls.=> NORMAL STRESS [facility]   Patient Active Problem List   Diagnosis Date Noted   Hypothyroidism 11/09/2021   Type 1 diabetes mellitus with hyperglycemia (HCC) 08/22/2020   History of mitral valve prolapse in adulthood 08/23/2010     REFERRING PROVIDER:  Huel Cote, MD     REFERRING DIAG: M25.351 (ICD-10-CM) - Hip instability, right   Rationale for Evaluation and Treatment: Rehabilitation  THERAPY DIAG:  Pain in right hip  Difficulty in walking, not elsewhere classified  Abnormal posture  Muscle weakness (generalized)  ONSET DATE: 07/21/23   SUBJECTIVE:  SUBJECTIVE STATEMENT: 08/26/2023 pt arrives w/ RW. States she feels she is slowly improving, does endorse some pain in tailbone which she attributes to busy day. No issues with HEP, no other new updates. Arrives late due to work activities and is agreeable to shortened session    Eval: T1DM and maintain a good AI1. 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 Has 4 dogs and husband is an avid exerciser. I have had multiple LE fx but have been trauma-related. Bilat 5th mets, Rt ankle fx 2 locations. Has had back surgery at L4-5 discectomy. Jan 27th went on a family trip- skiing on new equipment- fell back, more on right side with bruise on Rt shoulder. I think the  back of my skiis crossed and opened anteriorly and knees splayed out to strain groin muscles. Bindings did not release in fall. Hauled out by ski patrol and was in [facility]- ER xrays denied fx and sent me out on crutches.  After a week or 2 was not improving and tailbone felt like it was on fire. MRI showed bilat fractures, including sacrum. Did not miss a day at work and was unable to tolerate pain on walker. Developed tendonitis in right shoulder walking around the hospital on crutches. The 17th of Feb, Rt shoulder steroid injection. Discovered hip instability when I stepped into the tub-shower with right foot and felt something shift. When I rolled back to put my socks on, both hips shifted/popped/caught and had 10/10 pain and spent the day in bed. Dr B gave me a steroid shot in right hip on Monday and have had dramatic improvement. Was able to tolerate laying on her side for about 15 min last night. Walked short distances at home without RW.   PERTINENT HISTORY:  H/o ankle fx without surgery. T1DM Lt shoulder bursitis with PT in the past Injection to Rt shoulder recently for tendonitis   PAIN:  Are you having pain? Yes: NPRS scale: 1 lateral let, tailbone 3, overall uncomfortable, no pain walking in Pain location: Rt hip, tailbone, Lateral Lt thigh sensation Pain description: I feel a sensation down lateral Lt thigh Aggravating factors: walking Relieving factors: reduced WB, injection  PRECAUTIONS:  None  RED FLAGS: None   WEIGHT BEARING RESTRICTIONS:  WBAT  FALLS:  Has patient fallen in last 6 months? Yes. 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  DIAGNOSTIC FINDINGS:  DG hip 08/14/23: Fractures of the bilateral puboacetabular junction, inferior pubic rami and right sacrum a MRI are not well-defined by  radiograph. The right inferior ramus fracture is potentially visualized, the other fractures are radiographically occult. There is no hip dislocation. No pubic symphyseal or sacroiliac diastasis.  PATIENT SURVEYS:  LEFS 18  COGNITIVE STATUS: Within functional limits for tasks assessed   SENSATION: WFL  POSTURE:  Standing: Rt shoulder depression, decreased thoracic kyphosis & lumbar lordosis, notable pelvic rotation Supine: Lt leg functionally shorter, Rt anterior innom  HAND DOMINANCE:  Right  GAIT: Eval: RW, slow cadence, short stride    TREATMENT DATE:  [facility] Adult PT Treatment:  DATE: 08/26/23  Neuromuscular re-ed: Hooklying adduction isometric + core contraction x8 cues for gentle/comfortable force output, breath control Hooklying core contraction + alternating UE raise x5 each UE  Hooklying shoulder elevation w/ gentle shoulder adduction iso into ball x8 Seated glute sets 3x5   Self Care: Education on gradual progression of activities with respect to symptom behavior, pacing of tasks, activity modification as needed    Treatment                            2/28: Blank lines following charge title = not provided on this treatment date.   Manual:  TPDN No STM to bilateral proximal adductors  PROM bil shoulders  Gentle R GHJ distraction   There-ex: Glute+ab set 5" x40min (had some sacral discomfort after)                                                                                                                             Treatment                            2/27: Blank lines following charge title = not provided on this treatment date.   Manual:  TPDN No  There-ex: Hesch self correction for Rt ant innominate Ab set with ball squeeze Added GHJ flexion, cues for breathing There-Act:  Self Care: See edu Nuro-Re-ed:  Gait Training:     PATIENT EDUCATION:  Education details: rationale for  interventions, HEP  Person educated: Patient Education method: Explanation, Demonstration, Tactile cues, Verbal cues Education comprehension: verbalized understanding, returned demonstration, verbal cues required, tactile cues required, and needs further education     HOME EXERCISE PROGRAM: [id number] Hesch self correction for Rt anterior innominate rotation   ASSESSMENT:  CLINICAL IMPRESSION: 08/26/2023 Pt arrives w/ report of no issues after last session - agreeable to shortened session with late arrival due to work. She endorses some baseline tailbone pain she attributes to walking/standing + work activities. Today focusing on gentle lumbopelvic stability exercises with emphasis on reduction of muscle guarding and appropriate breath control with core contractions. Tolerates session well overall, no adverse events and reports improved tolerance compared to prior sessions. HEP updated accordingly. Reports reduced burning in tailbone at end of session but continues with some aching. Recommend continuing along current POC in order to address relevant deficits and improve functional tolerance. Pt departs today's session in no acute distress, all voiced questions/concerns addressed appropriately from PT perspective.    Eval: Patient is a 58 y.o. F who was seen today for physical therapy evaluation and treatment for Rt hip pain following an incident on skiis that resulted in strained ER through bilateral LEs. Multiple pelvic fractures and strain through adductors, history of lumbar spine surgical intervention all contributing to pain. Pt does have known tearing in hip labrum but is feeling much better since the injection and would like  to avoid surgery if possible.  Pt was very active prior to the accident and would like to return to PLOF. Pt also notes Rt shoulder pain onset since the fall- notable postural changes through thoracic spine creating biomechanical chain input from pelvis to shoulder.      REHAB POTENTIAL: Good  CLINICAL DECISION MAKING: Stable/uncomplicated  EVALUATION COMPLEXITY: Low   GOALS: Goals reviewed with patient? Yes  SHORT TERM GOALS: Target date: 3/22  Able to ambulate household distances, without AD, without antalgic pattern Baseline: Goal status: INITIAL  2.  Demo proper core control with exercises Baseline:  Goal status: INITIAL    LONG TERM GOALS: Target date: POC date  Hip strength 90% of left side Baseline: MMT to be performed as appropriate Goal status: INITIAL  2.  Progressing into plyometric motions with minimal to no pain Baseline:  Goal status: INITIAL  3.  Return to work with understanding of exercises & rest breaks to reduce discomfort Baseline:  Goal status: INITIAL  4.  Navigate uneven surfaces with minimal to no pain Baseline:  Goal status: INITIAL     PLAN:  PT FREQUENCY: 1-2x/week  PT DURATION: 12 weeks  PLANNED INTERVENTIONS: 97164- PT Re-evaluation, 97110-Therapeutic exercises, 97530- Therapeutic activity, 97112- Neuromuscular re-education, 97535- Self Care, 13086- Manual therapy, [phone]- Gait training, [phone]- Aquatic Therapy, [phone]- Electrical stimulation (unattended), Patient/Family education, Balance training, Stair training, Taping, Dry Needling, Joint mobilization, Spinal mobilization, Cryotherapy, and Moist heat.  PLAN FOR NEXT SESSION: consider aquatics. Pt also interested in interventions looking at hamstring tightness, massage therapy.     Ashley Murrain PT, DPT 08/26/2023 4:11 PM

## 2023-08-26 ENCOUNTER — Encounter (HOSPITAL_BASED_OUTPATIENT_CLINIC_OR_DEPARTMENT_OTHER): Payer: Self-pay | Admitting: Physical Therapy

## 2023-08-26 ENCOUNTER — Ambulatory Visit (HOSPITAL_BASED_OUTPATIENT_CLINIC_OR_DEPARTMENT_OTHER): Payer: Commercial Managed Care - PPO | Attending: Orthopaedic Surgery | Admitting: Physical Therapy

## 2023-08-26 ENCOUNTER — Telehealth: Payer: Self-pay | Admitting: Family Medicine

## 2023-08-26 DIAGNOSIS — M25551 Pain in right hip: Secondary | ICD-10-CM | POA: Insufficient documentation

## 2023-08-26 DIAGNOSIS — R262 Difficulty in walking, not elsewhere classified: Secondary | ICD-10-CM | POA: Insufficient documentation

## 2023-08-26 DIAGNOSIS — R293 Abnormal posture: Secondary | ICD-10-CM | POA: Insufficient documentation

## 2023-08-26 DIAGNOSIS — M25511 Pain in right shoulder: Secondary | ICD-10-CM | POA: Diagnosis not present

## 2023-08-26 DIAGNOSIS — M6281 Muscle weakness (generalized): Secondary | ICD-10-CM | POA: Insufficient documentation

## 2023-08-26 DIAGNOSIS — M25561 Pain in right knee: Secondary | ICD-10-CM | POA: Diagnosis not present

## 2023-08-26 NOTE — Telephone Encounter (Signed)
 Accommodation Certification Form completed. Spoke with pt and she will pickup at office. Copy to scan and Brandy.

## 2023-08-27 ENCOUNTER — Other Ambulatory Visit (HOSPITAL_COMMUNITY): Payer: Self-pay

## 2023-08-27 ENCOUNTER — Encounter: Payer: Self-pay | Admitting: Family Medicine

## 2023-08-28 ENCOUNTER — Other Ambulatory Visit (HOSPITAL_COMMUNITY): Payer: Self-pay

## 2023-08-28 ENCOUNTER — Encounter (HOSPITAL_BASED_OUTPATIENT_CLINIC_OR_DEPARTMENT_OTHER): Payer: Commercial Managed Care - PPO | Admitting: Physical Therapy

## 2023-08-28 ENCOUNTER — Telehealth: Payer: Self-pay

## 2023-08-28 MED ORDER — OXYCODONE-ACETAMINOPHEN 5-325 MG PO TABS
1.0000 | ORAL_TABLET | Freq: Three times a day (TID) | ORAL | 0 refills | Status: DC | PRN
  Filled 2023-08-28: qty 15, 5d supply, fill #0

## 2023-08-28 NOTE — Telephone Encounter (Signed)
 Form completed by Dr. Denyse Amass, fax confirmation received.

## 2023-09-02 ENCOUNTER — Ambulatory Visit (HOSPITAL_BASED_OUTPATIENT_CLINIC_OR_DEPARTMENT_OTHER): Payer: Commercial Managed Care - PPO | Admitting: Physical Therapy

## 2023-09-02 ENCOUNTER — Encounter (HOSPITAL_BASED_OUTPATIENT_CLINIC_OR_DEPARTMENT_OTHER): Payer: Self-pay | Admitting: Physical Therapy

## 2023-09-02 DIAGNOSIS — R293 Abnormal posture: Secondary | ICD-10-CM | POA: Diagnosis not present

## 2023-09-02 DIAGNOSIS — R262 Difficulty in walking, not elsewhere classified: Secondary | ICD-10-CM | POA: Diagnosis not present

## 2023-09-02 DIAGNOSIS — M25551 Pain in right hip: Secondary | ICD-10-CM

## 2023-09-02 DIAGNOSIS — M25561 Pain in right knee: Secondary | ICD-10-CM | POA: Diagnosis not present

## 2023-09-02 DIAGNOSIS — M25511 Pain in right shoulder: Secondary | ICD-10-CM | POA: Diagnosis not present

## 2023-09-02 DIAGNOSIS — M6281 Muscle weakness (generalized): Secondary | ICD-10-CM | POA: Diagnosis not present

## 2023-09-02 NOTE — Therapy (Signed)
 OUTPATIENT PHYSICAL THERAPY TREATMENT   Patient Name: Maria Evans MRN: [id number] DOB:[birth year], 58 y.o., female Today's Date: 09/02/2023  END OF SESSION:  PT End of Session - 09/02/23 1520     Visit Number 4    Number of Visits 25    Date for PT Re-Evaluation 11/15/23    Authorization Type [facility] [employer]    PT Start Time 1518    PT Stop Time 1600    PT Time Calculation (min) 42 min    Activity Tolerance Patient tolerated treatment well                Past Medical History:  Diagnosis Date   Anxiety    Anxiety with depression    Controlled.   Common migraine without aura    Diabetes mellitus type I, controlled (HCC)    Treated with 4 times daily insulin based on blood sugar monitoring   Hip sprain    & contusion   Hyperlipidemia due to type 1 diabetes mellitus (HCC)    Currently on atorvastatin.   Hypothyroid    Lipoma    MVP (mitral valve prolapse) 2005   Renally diagnosed back in 2005 (during her pregnancy)_, most recently evaluated in 2012.  [facility] - [facility] Phys[facility] - Cardiology, Dr. Emily Filbert); 11/2010 TTE: Moderate MR, posteriorly directed (recommend TEE) => TEE (01/2011) - mild MVP (anterior leaflet) with Mild-Mod MR.;    Past Surgical History:  Procedure Laterality Date   LAPAROSCOPIC LYSIS OF ADHESIONS     LIPOMA EXCISION Left    LUMBAR MICRODISCECTOMY     TRANSESOPHAGEAL ECHOCARDIOGRAM  02/13/2011   [facility] -> Dr. Emily Filbert [facility] Physicians-Cardiology): EF 55 to 60%.  Borderline LA dilation.  Prolapse of A2 scallop of mitral valve-mild MVP with mild to moderate MR.  (2 eccentric jets, posteriorly directed, c/w anterior leaflet pathology))   TRANSTHORACIC ECHOCARDIOGRAM  12/11/2010   [facility] -> Dr. Emily Filbert [facility] Physicians-Cardiology): EF 35 to 6%.  Mild LA dilation.  Moderate MR-eccentric/possibly directed jet consistent with antilipid pathology.  Mild TR, LP HTN, mild PR.  Recommend TEE.    TREADMILL STRESS ECHOCARDIOGRAM     [facility] -> Dr. Emily Filbert [facility] Physicians-Cardiology): Exercised 10 minutes (11 minutes), achieved Max Heart Rate 162 = 92% Max Predicted Heart Rate, clinically and electrocardiographically negative.  Nonspecific ST and T wave changes.  Prestress echo showed EF 55 to 60% with normal wall motion.  Post-Stress: EF 65 to 70% with nl augmentation of all walls.=> NORMAL STRESS [facility]   Patient Active Problem List   Diagnosis Date Noted   Hypothyroidism 11/09/2021   Type 1 diabetes mellitus with hyperglycemia (HCC) 08/22/2020   History of mitral valve prolapse in adulthood 08/23/2010     REFERRING PROVIDER:  Huel Cote, MD     REFERRING DIAG: M25.351 (ICD-10-CM) - Hip instability, right   Rationale for Evaluation and Treatment: Rehabilitation  THERAPY DIAG:  Pain in right hip  Difficulty in walking, not elsewhere classified  Abnormal posture  ONSET DATE: 07/21/23   SUBJECTIVE:  SUBJECTIVE STATEMENT: 09/02/2023 arrives w/ RW. States PT seems to be going well, is Land tomorrow and states she may get another injection, in L hip. Continues to feel more tailbone pain than anything. Has been walking without walker around the house.     Eval: T1DM and maintain a good AI1. 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 Has 4 dogs and husband is an avid exerciser. I have had multiple LE fx but have been trauma-related. Bilat 5th mets, Rt ankle fx 2 locations. Has had back surgery at L4-5 discectomy. Jan 27th went on a family trip- skiing on new equipment- fell back, more on right side with bruise on Rt shoulder. I think the back of my skiis crossed and opened anteriorly and knees  splayed out to strain groin muscles. Bindings did not release in fall. Hauled out by ski patrol and was in [facility] ER xrays denied fx and sent me out on crutches.  After a week or 2 was not improving and tailbone felt like it was on fire. MRI showed bilat fractures, including sacrum. Did not miss a day at work and was unable to tolerate pain on walker. Developed tendonitis in right shoulder walking around the hospital on crutches. The 17th of Feb, Rt shoulder steroid injection. Discovered hip instability when I stepped into the tub-shower with right foot and felt something shift. When I rolled back to put my socks on, both hips shifted/popped/caught and had 10/10 pain and spent the day in bed. Dr B gave me a steroid shot in right hip on Monday and have had dramatic improvement. Was able to tolerate laying on her side for about 15 min last night. Walked short distances at home without RW.   PERTINENT HISTORY:  H/o ankle fx without surgery. T1DM Lt shoulder bursitis with PT in the past Injection to Rt shoulder recently for tendonitis   PAIN:  Are you having pain? Yes: NPRS scale: 1/10 tailbone, no hip pain Pain location: Rt hip, tailbone, Lateral Lt thigh sensation Pain description: I feel a sensation down lateral Lt thigh Aggravating factors: walking Relieving factors: reduced WB, injection  PRECAUTIONS:  None  RED FLAGS: None   WEIGHT BEARING RESTRICTIONS:  WBAT  FALLS:  Has patient fallen in last 6 months? Yes. 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  DIAGNOSTIC FINDINGS:  DG hip 08/14/23: Fractures of the bilateral puboacetabular junction, inferior pubic rami and right sacrum a MRI are not well-defined by radiograph. The right inferior ramus fracture is potentially visualized, the other fractures are  radiographically occult. There is no hip dislocation. No pubic symphyseal or sacroiliac diastasis.  PATIENT SURVEYS:  LEFS 18  COGNITIVE STATUS: Within functional limits for tasks assessed   SENSATION: WFL  POSTURE:  Standing: Rt shoulder depression, decreased thoracic kyphosis & lumbar lordosis, notable pelvic rotation Supine: Lt leg functionally shorter, Rt anterior innom  HAND DOMINANCE:  Right  GAIT: Eval: RW, slow cadence, short stride    TREATMENT DATE:  [facility] Adult PT Treatment:  DATE: 09/02/23 Therapeutic Exercise: Active hamstring stretch supine x12 BIL  Hooklying butterfly stretch 3x30sec within comfortable ROM  HEP update + education/handout  Neuromuscular re-ed: Hooklying glute set x12 w/ 3 sec hold  Glute set + mini bridge x8 cues for comfortable ROM DKTC w/ swiss ball assist emphasis on core contraction 2x8 cues for breath control  Hooklying yellow band pulldown x8 cues for core contraction and breath control    [facility] Adult PT Treatment:                                                DATE: 08/26/23  Neuromuscular re-ed: Hooklying adduction isometric + core contraction x8 cues for gentle/comfortable force output, breath control Hooklying core contraction + alternating UE raise x5 each UE  Hooklying shoulder elevation w/ gentle shoulder adduction iso into ball x8 Seated glute sets 3x5   Self Care: Education on gradual progression of activities with respect to symptom behavior, pacing of tasks, activity modification as needed    Treatment                            2/28: Blank lines following charge title = not provided on this treatment date.   Manual:  TPDN No STM to bilateral proximal adductors  PROM bil shoulders  Gentle R GHJ distraction   There-ex: Glute+ab set 5" x16min (had some sacral discomfort after)                                                                                                                              Treatment                            2/27: Blank lines following charge title = not provided on this treatment date.   Manual:  TPDN No  There-ex: Hesch self correction for Rt ant innominate Ab set with ball squeeze Added GHJ flexion, cues for breathing There-Act:  Self Care: See edu Nuro-Re-ed:  Gait Training:     PATIENT EDUCATION:  Education details: rationale for interventions, HEP  Person educated: Patient Education method: Programmer, multimedia, Demonstration, Tactile cues, Verbal cues Education comprehension: verbalized understanding, returned demonstration, verbal cues required, tactile cues required, and needs further education     HOME EXERCISE PROGRAM: [id number] Hesch self correction for Rt anterior innominate rotation   ASSESSMENT:  CLINICAL IMPRESSION: 09/02/2023 Pt arrives w/ minimal tailbone pain - no issues after last session but does mention it was difficult returning to work after PT session. Today she demonstrates improved tolerance to progressions for hip/core stability, emphasis on comfortable core contraction and minimizing compensations. Adding active hamstring mobility given pt report of frequent stiffness in hamstrings. She reports improved tailbone pain as session goes on but not  full resolution. No adverse events or increases in pain. Recommend continuing along current POC in order to address relevant deficits and improve functional tolerance. Pt departs today's session in no acute distress, all voiced questions/concerns addressed appropriately from PT perspective.    Eval: Patient is a 58 y.o. F who was seen today for physical therapy evaluation and treatment for Rt hip pain following an incident on skiis that resulted in strained ER through bilateral LEs. Multiple pelvic fractures and strain through adductors, history of lumbar spine surgical intervention all contributing to pain. Pt does have known tearing in hip labrum but is  feeling much better since the injection and would like to avoid surgery if possible.  Pt was very active prior to the accident and would like to return to PLOF. Pt also notes Rt shoulder pain onset since the fall- notable postural changes through thoracic spine creating biomechanical chain input from pelvis to shoulder.     REHAB POTENTIAL: Good  CLINICAL DECISION MAKING: Stable/uncomplicated  EVALUATION COMPLEXITY: Low   GOALS: Goals reviewed with patient? Yes  SHORT TERM GOALS: Target date: 3/22  Able to ambulate household distances, without AD, without antalgic pattern Baseline: Goal status: INITIAL  2.  Demo proper core control with exercises Baseline:  Goal status: INITIAL    LONG TERM GOALS: Target date: POC date  Hip strength 90% of left side Baseline: MMT to be performed as appropriate Goal status: INITIAL  2.  Progressing into plyometric motions with minimal to no pain Baseline:  Goal status: INITIAL  3.  Return to work with understanding of exercises & rest breaks to reduce discomfort Baseline:  Goal status: INITIAL  4.  Navigate uneven surfaces with minimal to no pain Baseline:  Goal status: INITIAL     PLAN:  PT FREQUENCY: 1-2x/week  PT DURATION: 12 weeks  PLANNED INTERVENTIONS: [id number]- PT Re-evaluation, 97110-Therapeutic exercises, 97530- Therapeutic activity, 97112- Neuromuscular re-education, 97535- Self Care, [id number]- Manual therapy, [phone]- Gait training, [phone]- Aquatic Therapy, [phone]- Electrical stimulation (unattended), Patient/Family education, Balance training, Stair training, Taping, Dry Needling, Joint mobilization, Spinal mobilization, Cryotherapy, and Moist heat.  PLAN FOR NEXT SESSION: consider aquatics. Pt also interested in interventions looking at hamstring tightness, massage therapy.     Ashley Murrain PT, DPT 09/02/2023 4:10 PM

## 2023-09-03 ENCOUNTER — Ambulatory Visit (HOSPITAL_BASED_OUTPATIENT_CLINIC_OR_DEPARTMENT_OTHER): Payer: Commercial Managed Care - PPO | Admitting: Orthopaedic Surgery

## 2023-09-03 DIAGNOSIS — M25352 Other instability, left hip: Secondary | ICD-10-CM

## 2023-09-03 MED ORDER — LIDOCAINE HCL 1 % IJ SOLN
4.0000 mL | INTRAMUSCULAR | Status: AC | PRN
  Administered 2023-09-03: 4 mL

## 2023-09-03 MED ORDER — TRIAMCINOLONE ACETONIDE 40 MG/ML IJ SUSP
80.0000 mg | INTRAMUSCULAR | Status: AC | PRN
  Administered 2023-09-03: 80 mg via INTRA_ARTICULAR

## 2023-09-03 NOTE — Progress Notes (Signed)
 Chief Complaint: Bilateral hip pain     History of Present Illness:   09/03/2023: Presents today for follow-up of bilateral hips.  She did get extremely good relief from her right hip injection.  She is seeking a left hip injection at today's visit  Maria Evans is a 58 y.o. female today with ongoing bilateral hip pain since a ski accident in January of this year where she landed directly on her side.  She has been seeing Dr. Denyse Amass for this.  She has been using a walker which has flared up the shoulder.  She has had a shoulder injection as well which has given her relief for the shoulder but is having persistent pain today.  She did recently have right been quite disabling.  She works as a Teacher, early years/pre at [employer].  Have a history of type 1 diabetes.    PMH/PSH/Family History/Social History/Meds/Allergies:    Past Medical History:  Diagnosis Date  . Anxiety   . Anxiety with depression    Controlled.  . Common migraine without aura   . Diabetes mellitus type I, controlled (HCC)    Treated with 4 times daily insulin based on blood sugar monitoring  . Hip sprain    & contusion  . Hyperlipidemia due to type 1 diabetes mellitus (HCC)    Currently on atorvastatin.  Marland Kitchen Hypothyroid   . Lipoma   . MVP (mitral valve prolapse) 2005   Renally diagnosed back in 2005 (during her pregnancy)_, most recently evaluated in 2012.  [facility] Physicians - Cardiology, Dr. Emily Filbert); 11/2010 TTE: Moderate MR, posteriorly directed (recommend TEE) => TEE (01/2011) - mild MVP (anterior leaflet) with Mild-Mod MR.;    Past Surgical History:  Procedure Laterality Date  . LAPAROSCOPIC LYSIS OF ADHESIONS    . LIPOMA EXCISION Left   . LUMBAR MICRODISCECTOMY    . TRANSESOPHAGEAL ECHOCARDIOGRAM  02/13/2011   [facility] -> Dr. Emily Filbert [facility] Physicians-Cardiology): EF 55 to 60%.  Borderline LA dilation.  Prolapse of A2 scallop of mitral valve-mild MVP with mild to  moderate MR.  (2 eccentric jets, posteriorly directed, c/w anterior leaflet pathology))  . TRANSTHORACIC ECHOCARDIOGRAM  12/11/2010   [facility] -> Dr. Emily Filbert [facility] Physicians-Cardiology): EF 35 to 6%.  Mild LA dilation.  Moderate MR-eccentric/possibly directed jet consistent with antilipid pathology.  Mild TR, LP HTN, mild PR.  Recommend TEE.  Marland Kitchen TREADMILL STRESS ECHOCARDIOGRAM     [facility] -> Dr. Emily Filbert [facility] Physicians-Cardiology): Exercised 10 minutes (11 minutes), achieved Max Heart Rate 162 = 92% Max Predicted Heart Rate, clinically and electrocardiographically negative.  Nonspecific ST and T wave changes.  Prestress echo showed EF 55 to 60% with normal wall motion.  Post-Stress: EF 65 to 70% with nl augmentation of all walls.=> NORMAL STRESS [facility]   Social History   Socioeconomic History  . Marital status: Married    Spouse name: Not on file  . Number of children: 1  . Years of education: Not on file  . Highest education level: Not on file  Occupational History  . Occupation: Hospital pharmacist     Employer: [employer]  Tobacco Use  . Smoking status: Never  . Smokeless tobacco: Never  Vaping Use  . Vaping status: Never Used  Substance and Sexual Activity  . Alcohol use: Yes    Comment: occ, wine or beer  . Drug use: Never  . Sexual activity: Yes    Partners: Male  Birth control/protection: Post-menopausal  Other Topics Concern  . Not on file  Social History Narrative   She is currently divorced mother of 14 (73 year old son).   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    She is now with her longtime high school significant other after completing the divorce from her first husband.     She and her fianc intend to elope probably in May or June of this year.      She is an avid  exerciser.  Enjoys jogging and walking.  She does not list 2 miles with treadmill and walks about 45 minutes most days - either indoors on the treadmill or outside.         Social Drivers of Corporate investment banker Strain: Not on file  Food Insecurity: Not on file  Transportation Needs: Not on file  Physical Activity: Not on file  Stress: Not on file  Social Connections: Not on file   Family History  Problem Relation Age of Onset  . Hyperlipidemia Mother   . Hyperlipidemia Father   . Heart Problems Father   . Diabetes Brother   . Esophageal cancer Maternal Grandfather        Long-term smoker  . CAD Paternal Grandmother        Long-term smoker  . Heart attack Paternal Grandmother 47       Cause of death  . Stroke Paternal Grandmother    Allergies  Allergen Reactions  . Nitrofurantoin Itching and Other (See Comments)  . Other     Squash- hives, itching   Current Outpatient Medications  Medication Sig Dispense Refill  . Acetaminophen (TYLENOL PO) Take by mouth as needed.    . ALPRAZolam (XANAX) 0.5 MG tablet Take 1 tablet (0.5 mg total) by mouth daily as needed. 30 tablet 0  . atorvastatin (LIPITOR) 20 MG tablet Take 1 tablet (20 mg total) by mouth every other day. 90 tablet 1  . Blood Pressure Monitoring (OMRON 3 SERIES BP MONITOR) DEVI Use as directed 1 each 0  . Continuous Blood Gluc Receiver (DEXCOM G6 RECEIVER) DEVI Use to check blood sugar 4 times daily 1 each 0  . Continuous Glucose Sensor (DEXCOM G6 SENSOR) MISC USE WITH DEXCOM TO CHECK BLOOD SUGAR 4 TIMES DAILY 9 each 3  . Continuous Glucose Transmitter (DEXCOM G6 TRANSMITTER) MISC Use as instructed to check blood sugar change every 90 days 1 each 3  . cyclobenzaprine (FLEXERIL) 10 MG tablet Take 1 tablet (10 mg total) by mouth at bedtime as needed. 60 tablet 1  . escitalopram (LEXAPRO) 20 MG tablet Take 1 tablet (20 mg total) by mouth daily. 90 tablet 3  . fluocinonide gel (LIDEX) 0.05 % Apply Externally Twice a  day as needed for flare ups 30 g 1  . Glucagon 3 MG/DOSE POWD Place 3 mg into the nose once as needed for up to 1 dose. 1 each 11  . glucose blood (FREESTYLE TEST STRIPS) test strip Use as instructed 1-2 times a day 100 each 12  . GVOKE HYPOPEN 1-PACK 1 MG/0.2ML SOAJ Inject 0.2 mLs into the skin as needed. 0.2 mL PRN  . insulin degludec (TRESIBA FLEXTOUCH) 100 UNIT/ML FlexTouch Pen Inject up to 13 Units into the skin daily. (Patient taking differently: Inject into the skin daily. 11-13 units) 9 mL 3  . Insulin  Lispro-aabc (LYUMJEV KWIKPEN) 100 UNIT/ML KwikPen Inject up to 8 units before each meal 3-4 times a day (Patient taking differently: Inject up to 11 units before each meal 3-4 times a day) 15 mL 11  . Insulin NPH, Human,, Isophane, (HUMULIN N KWIKPEN) 100 UNIT/ML Kiwkpen Inject 2-4 Units into the skin daily in the afternoon. 15 mL 11  . Insulin Pen Needle (TECHLITE PLUS PEN NEEDLES) 32G X 4 MM MISC use as directed 4-6 times daily 300 each 3  . levocetirizine (XYZAL) 5 MG tablet Take 1 tablet (5 mg total) by mouth every evening. 30 tablet 3  . levothyroxine (SYNTHROID) 100 MCG tablet Take 1 tablet (100 mcg total) by mouth daily before breakfast. 90 tablet 3  . lisinopril (ZESTRIL) 2.5 MG tablet Take 1 tablet (2.5 mg total) by mouth daily. 90 tablet 0  . meloxicam (MOBIC) 15 MG tablet Take 1 tablet (15 mg total) by mouth daily with breakfast for 14 days, THEN 1 tablet (15 mg total) daily as needed. 30 tablet 1  . Multiple Vitamin (MULTIVITAMIN) capsule Take 1 capsule by mouth daily.    . Naproxen (NAPROSYN PO) Take by mouth.    . oxyCODONE-acetaminophen (PERCOCET/ROXICET) 5-325 MG tablet Take 1 tablet by mouth every 8 (eight) hours as needed for severe pain (pain score 7-10). 15 tablet 0  . traMADol (ULTRAM) 50 MG tablet Take 1 tablet (50 mg total) by mouth every 8 (eight) hours as needed for severe pain (pain score 7-10). 15 tablet 0  . valACYclovir (VALTREX) 1000 MG tablet Take 1,000 mg by  mouth as needed (fever blisters).     No current facility-administered medications for this visit.   No results found.  Review of Systems:   A ROS was performed including pertinent positives and negatives as documented in the HPI.  Physical Exam :   Constitutional: NAD and appears stated age Neurological: Alert and oriented Psych: Appropriate affect and cooperative Last menstrual period 02/07/2016.   Comprehensive Musculoskeletal Exam:    Bilateral hip with mildly positive FADIR with laxity and external rotation consistent with capsular insufficiency.  Otherwise range of motion is 40 degrees internal/external rotation of the right hip.  No pain with compression about the iliac crests.  Distal neurosensory exam is intact bilaterally.  Walks with mild antalgic gait   Imaging:   Xray (3 views pelvis): Normal  MRI (pelvis): Intact femoral acetabular joint with nondisplaced lateral compression type mechanism   I personally reviewed and interpreted the radiographs.   Assessment and Plan:   58 y.o. female with bilateral hip pain which was initially consistent with a lateral compression type injury.  At this time she is having persistent pain which I do believe may be related to hip instability right worse than left.  She did get very good relief from an injection of the right.  Will plan to proceed with an injection on the left as she did get quite good relief.  She will continue to work through physical therapy with core strengthening as well as pelvic floor work. -Left hip femoral acetabular injection provided after verbal consent obtained  Procedure Note  Patient: Maria Evans             Date of Birth: December 15, 1965           MRN: [id number]             Visit Date: 09/03/2023  Procedures: Visit Diagnoses: No diagnosis found.  Large Joint Inj: L hip joint on 09/03/2023  12:20 PM Indications: pain Details: 22 G 3.5 in needle, ultrasound-guided anterolateral  approach  Arthrogram: No  Medications: 4 mL lidocaine 1 %; 80 mg triamcinolone acetonide 40 MG/ML Outcome: tolerated well, no immediate complications Procedure, treatment alternatives, risks and benefits explained, specific risks discussed. Consent was given by the patient. Immediately prior to procedure a time out was called to verify the correct patient, procedure, equipment, support staff and site/side marked as required. Patient was prepped and draped in the usual sterile fashion.       I personally saw and evaluated the patient, and participated in the management and treatment plan.  Huel Cote, MD Attending Physician, Orthopedic Surgery  This document was dictated using Dragon voice recognition software. A reasonable attempt at proof reading has been made to minimize errors.

## 2023-09-04 NOTE — Telephone Encounter (Signed)
Forwarding to Dr. Corey to review.  

## 2023-09-05 ENCOUNTER — Encounter (HOSPITAL_BASED_OUTPATIENT_CLINIC_OR_DEPARTMENT_OTHER): Payer: Commercial Managed Care - PPO | Admitting: Physical Therapy

## 2023-09-05 ENCOUNTER — Other Ambulatory Visit (HOSPITAL_COMMUNITY): Payer: Self-pay

## 2023-09-05 MED ORDER — OXYCODONE-ACETAMINOPHEN 5-325 MG PO TABS
1.0000 | ORAL_TABLET | Freq: Three times a day (TID) | ORAL | 0 refills | Status: DC | PRN
  Filled 2023-09-05: qty 15, 5d supply, fill #0

## 2023-09-05 NOTE — Addendum Note (Signed)
 Addended by: Rodolph Bong on: 09/05/2023 12:12 PM   Modules accepted: Orders

## 2023-09-10 ENCOUNTER — Encounter (HOSPITAL_BASED_OUTPATIENT_CLINIC_OR_DEPARTMENT_OTHER): Payer: Self-pay | Admitting: Physical Therapy

## 2023-09-10 ENCOUNTER — Ambulatory Visit (HOSPITAL_BASED_OUTPATIENT_CLINIC_OR_DEPARTMENT_OTHER): Payer: Commercial Managed Care - PPO

## 2023-09-10 DIAGNOSIS — R262 Difficulty in walking, not elsewhere classified: Secondary | ICD-10-CM | POA: Diagnosis not present

## 2023-09-10 DIAGNOSIS — M25551 Pain in right hip: Secondary | ICD-10-CM | POA: Diagnosis not present

## 2023-09-10 DIAGNOSIS — M25561 Pain in right knee: Secondary | ICD-10-CM

## 2023-09-10 DIAGNOSIS — M25511 Pain in right shoulder: Secondary | ICD-10-CM | POA: Diagnosis not present

## 2023-09-10 DIAGNOSIS — R293 Abnormal posture: Secondary | ICD-10-CM

## 2023-09-10 DIAGNOSIS — M6281 Muscle weakness (generalized): Secondary | ICD-10-CM | POA: Diagnosis not present

## 2023-09-10 NOTE — Therapy (Signed)
 OUTPATIENT PHYSICAL THERAPY TREATMENT   Patient Name: Maria Evans MRN: [id number] DOB:20-Jan-1966, 58 y.o., female Today's Date: 09/10/2023  END OF SESSION:  PT End of Session - 09/10/23 1451     Visit Number 5    Number of Visits 25    Date for PT Re-Evaluation 11/15/23    Authorization Type [facility] [employer]    PT Start Time 1404    PT Stop Time 1445    PT Time Calculation (min) 41 min    Activity Tolerance Patient tolerated treatment well    Behavior During Therapy WFL for tasks assessed/performed                 Past Medical History:  Diagnosis Date   Anxiety    Anxiety with depression    Controlled.   Common migraine without aura    Diabetes mellitus type I, controlled (HCC)    Treated with 4 times daily insulin based on blood sugar monitoring   Hip sprain    & contusion   Hyperlipidemia due to type 1 diabetes mellitus (HCC)    Currently on atorvastatin.   Hypothyroid    Lipoma    MVP (mitral valve prolapse) 2005   Renally diagnosed back in 2005 (during her pregnancy)_, most recently evaluated in 2012.  [facility] - Cardiology, Dr. Emily Filbert); 11/2010 TTE: Moderate MR, posteriorly directed (recommend TEE) => TEE (01/2011) - mild MVP (anterior leaflet) with Mild-Mod MR.;    Past Surgical History:  Procedure Laterality Date   LAPAROSCOPIC LYSIS OF ADHESIONS     LIPOMA EXCISION Left    LUMBAR MICRODISCECTOMY     TRANSESOPHAGEAL ECHOCARDIOGRAM  02/13/2011   [facility] -> Dr. Emily Filbert [facility]-Cardiology): EF 55 to 60%.  Borderline LA dilation.  Prolapse of A2 scallop of mitral valve-mild MVP with mild to moderate MR.  (2 eccentric jets, posteriorly directed, c/w anterior leaflet pathology))   TRANSTHORACIC ECHOCARDIOGRAM  12/11/2010   [facility] -> Dr. Emily Filbert [facility]-Cardiology): EF 35 to 6%.  Mild LA dilation.  Moderate MR-eccentric/possibly directed jet consistent with  antilipid pathology.  Mild TR, LP HTN, mild PR.  Recommend TEE.   TREADMILL STRESS ECHOCARDIOGRAM     [facility] -> Dr. Emily Filbert [facility]-Cardiology): Exercised 10 minutes (11 minutes), achieved Max Heart Rate 162 = 92% Max Predicted Heart Rate, clinically and electrocardiographically negative.  Nonspecific ST and T wave changes.  Prestress echo showed EF 55 to 60% with normal wall motion.  Post-Stress: EF 65 to 70% with nl augmentation of all walls.=> NORMAL STRESS [facility]   Patient Active Problem List   Diagnosis Date Noted   Hypothyroidism 11/09/2021   Type 1 diabetes mellitus with hyperglycemia (HCC) 08/22/2020   History of mitral valve prolapse in adulthood 08/23/2010     REFERRING PROVIDER:  Huel Cote, MD     REFERRING DIAG: M25.351 (ICD-10-CM) - Hip instability, right   Rationale for Evaluation and Treatment: Rehabilitation  THERAPY DIAG:  Pain in right hip  Difficulty in walking, not elsewhere classified  Abnormal posture  Muscle weakness (generalized)  Pain in joint of right knee  ONSET DATE: 07/21/23   SUBJECTIVE:  SUBJECTIVE STATEMENT: 09/10/2023 arrives w/ RW. Pt states  she is doing well. She is walking independently at home but still needs a RW for outdoors. Pt feels more glute pain today. L hip injection seemed to be relieving. Pt does have a trip planned for next week where she is going to a concert with her brother.     Eval: T1DM and maintain a good AI1. 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 I like to go sailing and skiing, enjoys trail walking around [facility]. Has 4 dogs and husband is an avid exerciser. I have had multiple LE fx but have been trauma-related. Bilat 5th mets, Rt ankle fx 2 locations. Has had back surgery at L4-5 discectomy. Jan 27th  went on a family trip- skiing on new equipment- fell back, more on right side with bruise on Rt shoulder. I think the back of my skiis crossed and opened anteriorly and knees splayed out to strain groin muscles. Bindings did not release in fall. Hauled out by ski patrol and was in [facility]- ER xrays denied fx and sent me out on crutches.  After a week or 2 was not improving and tailbone felt like it was on fire. MRI showed bilat fractures, including sacrum. Did not miss a day at work and was unable to tolerate pain on walker. Developed tendonitis in right shoulder walking around the hospital on crutches. The 17th of Feb, Rt shoulder steroid injection. Discovered hip instability when I stepped into the tub-shower with right foot and felt something shift. When I rolled back to put my socks on, both hips shifted/popped/caught and had 10/10 pain and spent the day in bed. Dr B gave me a steroid shot in right hip on Monday and have had dramatic improvement. Was able to tolerate laying on her side for about 15 min last night. Walked short distances at home without RW.   PERTINENT HISTORY:  H/o ankle fx without surgery. T1DM Lt shoulder bursitis with PT in the past Injection to Rt shoulder recently for tendonitis   PAIN:  Are you having pain? Yes: NPRS scale: 1/10 tailbone, no hip pain Pain location: Rt hip, tailbone, Lateral Lt thigh sensation Pain description: I feel a sensation down lateral Lt thigh Aggravating factors: walking Relieving factors: reduced WB, injection  PRECAUTIONS:  None  RED FLAGS: None   WEIGHT BEARING RESTRICTIONS:  WBAT  FALLS:  Has patient fallen in last 6 months? Yes. 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  DIAGNOSTIC FINDINGS:  DG hip 08/14/23: Fractures of the bilateral puboacetabular  junction, inferior pubic rami and right sacrum a MRI are not well-defined by radiograph. The right inferior ramus fracture is potentially visualized, the other fractures are radiographically occult. There is no hip dislocation. No pubic symphyseal or sacroiliac diastasis.  PATIENT SURVEYS:  LEFS 18  COGNITIVE STATUS: Within functional limits for tasks assessed   SENSATION: WFL  POSTURE:  Standing: Rt shoulder depression, decreased thoracic kyphosis & lumbar lordosis, notable pelvic rotation Supine: Lt leg functionally shorter, Rt anterior innom  HAND DOMINANCE:  Right  GAIT: Eval: RW, slow cadence, short stride    TREATMENT DATE:   [facility] Adult PT Treatment:  DATE: 08/1923  Nustep Lvl 2 5 min  Heel toe rocking 3x8 with seated rest breaks Step up on 4" box and UE support 10x with seated rest break  AD sizing and usage with gait 76ft with emphasis of step to pattern  Edu of walking interval program, walking at work, safety with building walking tolerance.     [facility] Adult PT Treatment:                                                DATE: 09/02/23 Therapeutic Exercise: Active hamstring stretch supine x12 BIL  Hooklying butterfly stretch 3x30sec within comfortable ROM  HEP update + education/handout  Neuromuscular re-ed: Hooklying glute set x12 w/ 3 sec hold  Glute set + mini bridge x8 cues for comfortable ROM DKTC w/ swiss ball assist emphasis on core contraction 2x8 cues for breath control  Hooklying yellow band pulldown x8 cues for core contraction and breath control    [facility] Adult PT Treatment:                                                DATE: 08/26/23  Neuromuscular re-ed: Hooklying adduction isometric + core contraction x8 cues for gentle/comfortable force output, breath control Hooklying core contraction + alternating UE raise x5 each UE  Hooklying shoulder elevation w/ gentle shoulder adduction iso into ball  x8 Seated glute sets 3x5   Self Care: Education on gradual progression of activities with respect to symptom behavior, pacing of tasks, activity modification as needed    Treatment                            2/28: Blank lines following charge title = not provided on this treatment date.   Manual:  TPDN No STM to bilateral proximal adductors  PROM bil shoulders  Gentle R GHJ distraction   There-ex: Glute+ab set 5" x41min (had some sacral discomfort after)                                                                                                                             Treatment                            2/27: Blank lines following charge title = not provided on this treatment date.   Manual:  TPDN No  There-ex: Hesch self correction for Rt ant innominate Ab set with ball squeeze Added GHJ flexion, cues for breathing There-Act:  Self Care: See edu Nuro-Re-ed:  Gait Training:     PATIENT EDUCATION:  Education details: rationale for interventions, HEP  Person educated:  Patient Education method: Explanation, Demonstration, Tactile cues, Verbal cues Education comprehension: verbalized understanding, returned demonstration, verbal cues required, tactile cues required, and needs further education     HOME EXERCISE PROGRAM: [id number] Hesch self correction for Rt anterior innominate rotation   ASSESSMENT:  CLINICAL IMPRESSION: 09/10/2023 patient with improved tolerance to increase exercise volume at today's session.  But does require more seated rest breaks due to right lower extremity weakness and fatigue.  No pain noted during session but patient does report right hip and glutes tightness with increase in exercise volume.  Patient was able to perform all standing exercises today to improve right lower extremity motor control, gait quality, and independence with community ambulation.  Patient was able to wean to single axillary crutch at today's session for gait  and advised on rest breaks as tolerated for walking safety his right lower extremity does tend to buckle with increased and repetitions.  Education provided on walking program in order to improve patient's walking tolerance.  Home exercise program updated accordingly to improve single-leg strength.  Plan to continue with standing and weightbearing type exercises patient does have upcoming trip that she needs to be able to maneuver during. Patient should benefit from continued skilled therapy in order in order to reduce current functional deficits to improve return to normalized ADL and community ambulation.  Eval: Patient is a 57 y.o. F who was seen today for physical therapy evaluation and treatment for Rt hip pain following an incident on skiis that resulted in strained ER through bilateral LEs. Multiple pelvic fractures and strain through adductors, history of lumbar spine surgical intervention all contributing to pain. Pt does have known tearing in hip labrum but is feeling much better since the injection and would like to avoid surgery if possible.  Pt was very active prior to the accident and would like to return to PLOF. Pt also notes Rt shoulder pain onset since the fall- notable postural changes through thoracic spine creating biomechanical chain input from pelvis to shoulder.     REHAB POTENTIAL: Good  CLINICAL DECISION MAKING: Stable/uncomplicated  EVALUATION COMPLEXITY: Low   GOALS: Goals reviewed with patient? Yes  SHORT TERM GOALS: Target date: 3/22  Able to ambulate household distances, without AD, without antalgic pattern Baseline: Goal status: INITIAL  2.  Demo proper core control with exercises Baseline:  Goal status: INITIAL    LONG TERM GOALS: Target date: POC date  Hip strength 90% of left side Baseline: MMT to be performed as appropriate Goal status: INITIAL  2.  Progressing into plyometric motions with minimal to no pain Baseline:  Goal status:  INITIAL  3.  Return to work with understanding of exercises & rest breaks to reduce discomfort Baseline:  Goal status: INITIAL  4.  Navigate uneven surfaces with minimal to no pain Baseline:  Goal status: INITIAL     PLAN:  PT FREQUENCY: 1-2x/week  PT DURATION: 12 weeks  PLANNED INTERVENTIONS: 97164- PT Re-evaluation, 97110-Therapeutic exercises, 97530- Therapeutic activity, 97112- Neuromuscular re-education, 97535- Self Care, 54098- Manual therapy, [phone]- Gait training, [phone]- Aquatic Therapy, [phone]- Electrical stimulation (unattended), Patient/Family education, Balance training, Stair training, Taping, Dry Needling, Joint mobilization, Spinal mobilization, Cryotherapy, and Moist heat.  PLAN FOR NEXT SESSION: consider aquatics. Pt also interested in interventions looking at hamstring tightness, massage therapy.   Zebedee Iba PT, DPT 09/10/23 2:56 PM

## 2023-09-11 ENCOUNTER — Other Ambulatory Visit: Payer: Self-pay

## 2023-09-12 ENCOUNTER — Encounter (HOSPITAL_BASED_OUTPATIENT_CLINIC_OR_DEPARTMENT_OTHER): Payer: Self-pay

## 2023-09-12 ENCOUNTER — Ambulatory Visit (HOSPITAL_BASED_OUTPATIENT_CLINIC_OR_DEPARTMENT_OTHER): Payer: Commercial Managed Care - PPO

## 2023-09-12 DIAGNOSIS — M25561 Pain in right knee: Secondary | ICD-10-CM

## 2023-09-12 DIAGNOSIS — R293 Abnormal posture: Secondary | ICD-10-CM

## 2023-09-12 DIAGNOSIS — M6281 Muscle weakness (generalized): Secondary | ICD-10-CM

## 2023-09-12 DIAGNOSIS — M25551 Pain in right hip: Secondary | ICD-10-CM | POA: Diagnosis not present

## 2023-09-12 DIAGNOSIS — M25511 Pain in right shoulder: Secondary | ICD-10-CM

## 2023-09-12 DIAGNOSIS — R262 Difficulty in walking, not elsewhere classified: Secondary | ICD-10-CM | POA: Diagnosis not present

## 2023-09-12 NOTE — Therapy (Signed)
 OUTPATIENT PHYSICAL THERAPY TREATMENT   Patient Name: Maria Evans MRN: [id number] DOB:[birth year], 58 y.o., female Today's Date: 09/12/2023  END OF SESSION:  PT End of Session - 09/12/23 0934     Visit Number 6    Number of Visits 25    Date for PT Re-Evaluation 11/15/23    Authorization Type [facility] [employer]et[employer]    PT Start Time 0932    PT Stop Time 1016    PT Time Calculation (min) 44 min    Activity Tolerance Patient tolerated treatment well    Behavior During Therapy [facility] for tasks assessed/performed                  Past Medical History:  Diagnosis Date   Anxiety    Anxiety with depression    Controlled.   Common migraine without aura    Diabetes mellitus type I, controlled (HCC)    Treated with 4 times daily insulin based on blood sugar monitoring   Hip sprain    & contusion   Hyperlipidemia due to type 1 diabetes mellitus (HCC)    Currently on atorvastatin.   Hypothyroid    Lipoma    MVP (mitral valve prolapse) 2005   Renally diagnosed back in 2005 (during her pregnancy)_, most recently evaluated in 2012.  [facility] - Cardiology, Dr. Emily Filbert); 11/2010 TTE: Moderate MR, posteriorly directed (recommend TEE) => TEE (01/2011) - mild MVP (anterior leaflet) with Mild-Mod MR.;    Past Surgical History:  Procedure Laterality Date   LAPAROSCOPIC LYSIS OF ADHESIONS     LIPOMA EXCISION Left    LUMBAR MICRODISCECTOMY     TRANSESOPHAGEAL ECHOCARDIOGRAM  02/13/2011   [facility] -> Dr. Emily Filbert [facility]-Cardiology): EF 55 to 60%.  Borderline LA dilation.  Prolapse of A2 scallop of mitral valve-mild MVP with mild to moderate MR.  (2 eccentric jets, posteriorly directed, c/w anterior leaflet pathology))   TRANSTHORACIC ECHOCARDIOGRAM  12/11/2010   [facility] -> Dr. Emily Filbert [facility]-Cardiology): EF 35 to 6%.  Mild LA dilation.  Moderate MR-eccentric/possibly directed jet consistent with  antilipid pathology.  Mild TR, LP HTN, mild PR.  Recommend TEE.   TREADMILL STRESS ECHOCARDIOGRAM     [facility] -> Dr. Emily Filbert [facility]-Cardiology): Exercised 10 minutes (11 minutes), achieved Max Heart Rate 162 = 92% Max Predicted Heart Rate, clinically and electrocardiographically negative.  Nonspecific ST and T wave changes.  Prestress echo showed EF 55 to 60% with normal wall motion.  Post-Stress: EF 65 to 70% with nl augmentation of all walls.=> NORMAL STRESS [facility]   Patient Active Problem List   Diagnosis Date Noted   Hypothyroidism 11/09/2021   Type 1 diabetes mellitus with hyperglycemia (HCC) 08/22/2020   History of mitral valve prolapse in adulthood 08/23/2010     REFERRING PROVIDER:  Huel Cote, MD     REFERRING DIAG: M25.351 (ICD-10-CM) - Hip instability, right   Rationale for Evaluation and Treatment: Rehabilitation  THERAPY DIAG:  Pain in right hip  Difficulty in walking, not elsewhere classified  Abnormal posture  Muscle weakness (generalized)  Acute pain of right shoulder  Pain in joint of right knee  ONSET DATE: 07/21/23   SUBJECTIVE:  SUBJECTIVE STATEMENT: 09/12/2023 arrives with single crutch on L side. States this is her first time trying it since PT last session. R arm bothering her a lot last night and into today. Unsure why. 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  Trying to wean off percocet.     Eval: T1DM and maintain a good AI1. 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 I like to go sailing and skiing, enjoys trail walking around [facility]. Has 4 dogs and husband is an avid exerciser. I have had multiple LE fx but have been trauma-related. Bilat 5th mets, Rt ankle fx 2 locations. Has had back surgery at L4-5 discectomy. Jan  27th went on a family trip- skiing on new equipment- fell back, more on right side with bruise on Rt shoulder. I think the back of my skiis crossed and opened anteriorly and knees splayed out to strain groin muscles. Bindings did not release in fall. Hauled out by ski patrol and was in [facility]- ER xrays denied fx and sent me out on crutches.  After a week or 2 was not improving and tailbone felt like it was on fire. MRI showed bilat fractures, including sacrum. Did not miss a day at work and was unable to tolerate pain on walker. Developed tendonitis in right shoulder walking around the hospital on crutches. The 17th of Feb, Rt shoulder steroid injection. Discovered hip instability when I stepped into the tub-shower with right foot and felt something shift. When I rolled back to put my socks on, both hips shifted/popped/caught and had 10/10 pain and spent the day in bed. Dr B gave me a steroid shot in right hip on Monday and have had dramatic improvement. Was able to tolerate laying on her side for about 15 min last night. Walked short distances at home without RW.   PERTINENT HISTORY:  H/o ankle fx without surgery. T1DM Lt shoulder bursitis with PT in the past Injection to Rt shoulder recently for tendonitis   PAIN:  Are you having pain? Yes: NPRS scale: 1/10 tailbone, no hip pain Pain location: Rt hip, tailbone, Lateral Lt thigh sensation Pain description: I feel a sensation down lateral Lt thigh Aggravating factors: walking Relieving factors: reduced WB, injection  PRECAUTIONS:  None  RED FLAGS: None   WEIGHT BEARING RESTRICTIONS:  WBAT  FALLS:  Has patient fallen in last 6 months? Yes. 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  DIAGNOSTIC FINDINGS:  DG hip 08/14/23: Fractures of the bilateral puboacetabular  junction, inferior pubic rami and right sacrum a MRI are not well-defined by radiograph. The right inferior ramus fracture is potentially visualized, the other fractures are radiographically occult. There is no hip dislocation. No pubic symphyseal or sacroiliac diastasis.  PATIENT SURVEYS:  LEFS 18  COGNITIVE STATUS: Within functional limits for tasks assessed   SENSATION: WFL  POSTURE:  Standing: Rt shoulder depression, decreased thoracic kyphosis & lumbar lordosis, notable pelvic rotation Supine: Lt leg functionally shorter, Rt anterior innom  HAND DOMINANCE:  Right  GAIT: Eval: RW, slow cadence, short stride    TREATMENT DATE:   [facility] Adult PT Treatment:  DATE: 09/12/23 Therapeutic Exercise: PROM R shoulder Supine HSS Manual adductor stretch Nu-step L3 x62min (Tried UE and LE, but Ue's became painful so switched to Les)  Heel toe rocking/raises x10 Step ups 4" with UE support x10ea Glute set with parital bridge 2x10   [facility] Adult PT Treatment:                                                DATE: 08/1923  Nustep Lvl 2 5 min  Heel toe rocking 3x8 with seated rest breaks Step up on 4" box and UE support 10x with seated rest break  AD sizing and usage with gait 93ft with emphasis of step to pattern  Edu of walking interval program, walking at work, safety with building walking tolerance.     [facility] Adult PT Treatment:                                                DATE: 09/02/23 Therapeutic Exercise: Active hamstring stretch supine x12 BIL  Hooklying butterfly stretch 3x30sec within comfortable ROM  HEP update + education/handout  Neuromuscular re-ed: Hooklying glute set x12 w/ 3 sec hold  Glute set + mini bridge x8 cues for comfortable ROM DKTC w/ swiss ball assist emphasis on core contraction 2x8 cues for breath control  Hooklying yellow band pulldown x8 cues for core contraction and breath control    [facility] Adult  PT Treatment:                                                DATE: 08/26/23  Neuromuscular re-ed: Hooklying adduction isometric + core contraction x8 cues for gentle/comfortable force output, breath control Hooklying core contraction + alternating UE raise x5 each UE  Hooklying shoulder elevation w/ gentle shoulder adduction iso into ball x8 Seated glute sets 3x5   Self Care: Education on gradual progression of activities with respect to symptom behavior, pacing of tasks, activity modification as needed    PATIENT EDUCATION:  Education details: rationale for interventions, HEP  Person educated: Patient Education method: Programmer, multimedia, Demonstration, Tactile cues, Verbal cues Education comprehension: verbalized understanding, returned demonstration, verbal cues required, tactile cues required, and needs further education     HOME EXERCISE PROGRAM: [id number] Hesch self correction for Rt anterior innominate rotation   ASSESSMENT:  CLINICAL IMPRESSION: 09/12/2023 Spent time on PROM for R shoulder to improve pain free mobility. Pt with good tolerance for continuance of LE strengthening tasks initiated at last session. Pt noted improved ability for transfers between exercises in clinic as well as when at home. Will continue to monitor pain level and progress as tolerated.   Eval: Patient is a 58 y.o. F who was seen today for physical therapy evaluation and treatment for Rt hip pain following an incident on skiis that resulted in strained ER through bilateral LEs. Multiple pelvic fractures and strain through adductors, history of lumbar spine surgical intervention all contributing to pain. Pt does have known tearing in hip labrum but is feeling much better since the injection and would like to avoid surgery if possible.  Pt was  very active prior to the accident and would like to return to PLOF. Pt also notes Rt shoulder pain onset since the fall- notable postural changes through thoracic spine  creating biomechanical chain input from pelvis to shoulder.     REHAB POTENTIAL: Good  CLINICAL DECISION MAKING: Stable/uncomplicated  EVALUATION COMPLEXITY: Low   GOALS: Goals reviewed with patient? Yes  SHORT TERM GOALS: Target date: 3/22  Able to ambulate household distances, without AD, without antalgic pattern Baseline: Goal status: IN PROGRESS (weaning off FWW to single crutch 3/21)  2.  Demo proper core control with exercises Baseline:  Goal status: IN PROGRESS 3/21    LONG TERM GOALS: Target date: POC date  Hip strength 90% of left side Baseline: MMT to be performed as appropriate Goal status: INITIAL  2.  Progressing into plyometric motions with minimal to no pain Baseline:  Goal status: INITIAL  3.  Return to work with understanding of exercises & rest breaks to reduce discomfort Baseline:  Goal status: IN PROGRESS  4.  Navigate uneven surfaces with minimal to no pain Baseline:  Goal status: INITIAL     PLAN:  PT FREQUENCY: 1-2x/week  PT DURATION: 12 weeks  PLANNED INTERVENTIONS: 97164- PT Re-evaluation, 97110-Therapeutic exercises, 97530- Therapeutic activity, 97112- Neuromuscular re-education, 97535- Self Care, 74259- Manual therapy, [phone]- Gait training, [phone]- Aquatic Therapy, [phone]- Electrical stimulation (unattended), Patient/Family education, Balance training, Stair training, Taping, Dry Needling, Joint mobilization, Spinal mobilization, Cryotherapy, and Moist heat.  PLAN FOR NEXT SESSION: consider aquatics. Pt also interested in interventions looking at hamstring tightness, massage therapy.    Donnel Saxon Deeanna Beightol, PTA 09/12/2023, 10:24 AM

## 2023-09-15 ENCOUNTER — Encounter: Payer: Self-pay | Admitting: Family Medicine

## 2023-09-15 NOTE — Telephone Encounter (Signed)
 Pt received steroid injection for right shoulder pain on 08/21/23.   Forwarding to Dr. Denyse Amass to review and advise regarding repeat injection and/or Continuing PT.

## 2023-09-16 ENCOUNTER — Other Ambulatory Visit (HOSPITAL_COMMUNITY): Payer: Self-pay

## 2023-09-16 ENCOUNTER — Telehealth: Payer: Self-pay

## 2023-09-16 MED ORDER — OXYCODONE-ACETAMINOPHEN 5-325 MG PO TABS
1.0000 | ORAL_TABLET | Freq: Three times a day (TID) | ORAL | 0 refills | Status: DC | PRN
Start: 2023-09-16 — End: 2024-01-29
  Filled 2023-09-16: qty 15, 5d supply, fill #0

## 2023-09-16 NOTE — Telephone Encounter (Signed)
Form completed and placed on Dr. Corey's desk to review and sign.  

## 2023-09-16 NOTE — Telephone Encounter (Signed)
 [employer]  Leave # [id number]

## 2023-09-17 NOTE — Telephone Encounter (Signed)
 Faxed, sent to scan, copy placed in Brandy's box

## 2023-09-17 NOTE — Telephone Encounter (Signed)
 Form reviewed and signed by Dr. Denyse Amass, placed at the front desk for faxing/scanning.

## 2023-09-19 ENCOUNTER — Ambulatory Visit (HOSPITAL_BASED_OUTPATIENT_CLINIC_OR_DEPARTMENT_OTHER): Payer: Commercial Managed Care - PPO | Admitting: Physical Therapy

## 2023-09-19 ENCOUNTER — Encounter (HOSPITAL_BASED_OUTPATIENT_CLINIC_OR_DEPARTMENT_OTHER): Payer: Self-pay | Admitting: Physical Therapy

## 2023-09-19 DIAGNOSIS — M6281 Muscle weakness (generalized): Secondary | ICD-10-CM | POA: Diagnosis not present

## 2023-09-19 DIAGNOSIS — R293 Abnormal posture: Secondary | ICD-10-CM

## 2023-09-19 DIAGNOSIS — M25511 Pain in right shoulder: Secondary | ICD-10-CM | POA: Diagnosis not present

## 2023-09-19 DIAGNOSIS — R262 Difficulty in walking, not elsewhere classified: Secondary | ICD-10-CM

## 2023-09-19 DIAGNOSIS — M25551 Pain in right hip: Secondary | ICD-10-CM

## 2023-09-19 DIAGNOSIS — M25561 Pain in right knee: Secondary | ICD-10-CM | POA: Diagnosis not present

## 2023-09-19 NOTE — Therapy (Signed)
 OUTPATIENT PHYSICAL THERAPY TREATMENT   Patient Name: Maria Evans MRN: [id number] DOB:02-05-1966, 58 y.o., female Today's Date: 09/19/2023  END OF SESSION:  PT End of Session - 09/19/23 1351     Visit Number 7    Number of Visits 25    Date for PT Re-Evaluation 11/15/23    Authorization Type [facility] [employer]    PT Start Time 1350    PT Stop Time 1429    PT Time Calculation (min) 39 min    Activity Tolerance Patient tolerated treatment well                   Past Medical History:  Diagnosis Date   Anxiety    Anxiety with depression    Controlled.   Common migraine without aura    Diabetes mellitus type I, controlled (HCC)    Treated with 4 times daily insulin based on blood sugar monitoring   Hip sprain    & contusion   Hyperlipidemia due to type 1 diabetes mellitus (HCC)    Currently on atorvastatin.   Hypothyroid    Lipoma    MVP (mitral valve prolapse) 2005   Renally diagnosed back in 2005 (during her pregnancy)_, most recently evaluated in 2012.  [facility] - Card[facility], Dr. Emily Filbert); 11/2010 TTE: Moderate MR, posteriorly directed (recommend TEE) => TEE (01/2011) - mild MVP (anterior leaflet) with Mild-Mod MR.;    Past Surgical History:  Procedure Laterality Date   LAPAROSCOPIC LYSIS OF ADHESIONS     LIPOMA EXCISION Left    LUMBAR MICRODISCECTOMY     TRANSESOPHAGEAL ECHOCARDIOGRAM  02/13/2011   [facility] -> Dr. Emily Filbert [facility]): EF 55 to 60%.  Borderline LA dilation.  Prolapse of A2 scallop of mitral valve-mild MVP with mild to moderate MR.  (2 eccentric jets, posteriorly directed, c/w anterior leaflet pathology))   TRANSTHORACIC ECHOCARDIOGRAM  12/11/2010   [facility] -> Dr. Emily Filbert [facility]-Cardiology): EF 35 to 6%.  Mild LA dilation.  Moderate MR-eccentric/possibly directed jet consistent with antilipid pathology.  Mild TR, LP HTN, mild PR.  Recommend TEE.    TREADMILL STRESS ECHOCARDIOGRAM     [facility] -> Dr. Emily Filbert [facility]-Cardiology): Exercised 10 minutes (11 minutes), achieved Max Heart Rate 162 = 92% Max Predicted Heart Rate, clinically and electrocardiographically negative.  Nonspecific ST and T wave changes.  Prestress echo showed EF 55 to 60% with normal wall motion.  Post-Stress: EF 65 to 70% with nl augmentation of all walls.=> NORMAL STRESS [facility]   Patient Active Problem List   Diagnosis Date Noted   Hypothyroidism 11/09/2021   Type 1 diabetes mellitus with hyperglycemia (HCC) 08/22/2020   History of mitral valve prolapse in adulthood 08/23/2010     REFERRING PROVIDER:  Huel Cote, MD     REFERRING DIAG: M25.351 (ICD-10-CM) - Hip instability, right   Rationale for Evaluation and Treatment: Rehabilitation  THERAPY DIAG:  Pain in right hip  Difficulty in walking, not elsewhere classified  Abnormal posture  Muscle weakness (generalized)  ONSET DATE: 07/21/23   SUBJECTIVE:  SUBJECTIVE STATEMENT: 09/19/2023 ambulates in without AD. States today is her first day without it. Worked today, did take some pain medication. Does feel like things are heading the right direction as far as pain and mobility goes. Seeing Dr. Elmer Ramp   Eval: T1DM and maintain a good AI1. 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 Has 4 dogs and husband is an avid exerciser. I have had multiple LE fx but have been trauma-related. Bilat 5th mets, Rt ankle fx 2 locations. Has had back surgery at L4-5 discectomy. Jan 27th went on a family trip- skiing on new equipment- fell back, more on right side with bruise on Rt shoulder. I think the back of my skiis crossed and opened  anteriorly and knees splayed out to strain groin muscles. Bindings did not release in fall. Hauled out by ski patrol and was in [facility]- ER xrays denied fx and sent me out on crutches.  After a week or 2 was not improving and tailbone felt like it was on fire. MRI showed bilat fractures, including sacrum. Did not miss a day at work and was unable to tolerate pain on walker. Developed tendonitis in right shoulder walking around the hospital on crutches. The 17th of Feb, Rt shoulder steroid injection. Discovered hip instability when I stepped into the tub-shower with right foot and felt something shift. When I rolled back to put my socks on, both hips shifted/popped/caught and had 10/10 pain and spent the day in bed. Dr B gave me a steroid shot in right hip on Monday and have had dramatic improvement. Was able to tolerate laying on her side for about 15 min last night. Walked short distances at home without RW.   PERTINENT HISTORY:  H/o ankle fx without surgery. T1DM Lt shoulder bursitis with PT in the past Injection to Rt shoulder recently for tendonitis   PAIN:  Are you having pain? Yes: NPRS scale: no pain at present, tightness in hamstrings Pain location: Rt hip, tailbone, Lateral Lt thigh sensation Pain description: I feel a sensation down lateral Lt thigh Aggravating factors: walking Relieving factors: reduced WB, injection  PRECAUTIONS:  None  RED FLAGS: None   WEIGHT BEARING RESTRICTIONS:  WBAT  FALLS:  Has patient fallen in last 6 months? Yes. 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  DIAGNOSTIC FINDINGS:  DG hip 08/14/23: Fractures of the bilateral puboacetabular junction, inferior pubic rami and right sacrum a MRI are not well-defined by radiograph. The right inferior ramus fracture is potentially  visualized, the other fractures are radiographically occult. There is no hip dislocation. No pubic symphyseal or sacroiliac diastasis.  PATIENT SURVEYS:  LEFS 18  COGNITIVE STATUS: Within functional limits for tasks assessed   SENSATION: WFL  POSTURE:  Standing: Rt shoulder depression, decreased thoracic kyphosis & lumbar lordosis, notable pelvic rotation Supine: Lt leg functionally shorter, Rt anterior innom  HAND DOMINANCE:  Right  GAIT: Eval: RW, slow cadence, short stride    TREATMENT DATE:  [facility] Adult PT Treatment:  DATE: 09/19/23 Therapeutic Exercise: Nu step L3 LE only Supine active hamstring stretch x10 BIL Butterfly stretch 2x69min  HEP discussion, rationale for interventions  Therapeutic Activity: STS x10 cues for symmetrical WB 6 inch step up BIL w/ UE support CGA x6 BIL Ambulation in clinic with cues for appropriate weight shifting and posture Continued education re: pacing of activities, symptom behavior as pertains to functional tolerance     [facility] Adult PT Treatment:                                                DATE: 09/12/23 Therapeutic Exercise: PROM R shoulder Supine HSS Manual adductor stretch Nu-step L3 x13min (Tried UE and LE, but Ue's became painful so switched to Les)  Heel toe rocking/raises x10 Step ups 4" with UE support x10ea Glute set with parital bridge 2x10   [facility] Adult PT Treatment:                                                DATE: 08/1923  Nustep Lvl 2 5 min  Heel toe rocking 3x8 with seated rest breaks Step up on 4" box and UE support 10x with seated rest break  AD sizing and usage with gait 17ft with emphasis of step to pattern  Edu of walking interval program, walking at work, safety with building walking tolerance.     [facility] Adult PT Treatment:                                                DATE: 09/02/23 Therapeutic Exercise: Active hamstring stretch supine x12 BIL   Hooklying butterfly stretch 3x30sec within comfortable ROM  HEP update + education/handout  Neuromuscular re-ed: Hooklying glute set x12 w/ 3 sec hold  Glute set + mini bridge x8 cues for comfortable ROM DKTC w/ swiss ball assist emphasis on core contraction 2x8 cues for breath control  Hooklying yellow band pulldown x8 cues for core contraction and breath control    [facility] Adult PT Treatment:                                                DATE: 08/26/23  Neuromuscular re-ed: Hooklying adduction isometric + core contraction x8 cues for gentle/comfortable force output, breath control Hooklying core contraction + alternating UE raise x5 each UE  Hooklying shoulder elevation w/ gentle shoulder adduction iso into ball x8 Seated glute sets 3x5   Self Care: Education on gradual progression of activities with respect to symptom behavior, pacing of tasks, activity modification as needed    PATIENT EDUCATION:  Education details: rationale for interventions, HEP  Person educated: Patient Education method: Explanation, Demonstration, Tactile cues, Verbal cues Education comprehension: verbalized understanding, returned demonstration, verbal cues required, tactile cues required, and needs further education     HOME EXERCISE PROGRAM: [id number] Hesch self correction for Rt anterior innominate rotation   ASSESSMENT:  CLINICAL IMPRESSION: 09/19/2023 Pt arrives w/ report of consistent progress, no  AD today. Able to progress closed chain functional strengthening which pt does well with, emphasis on comfortable weightshifting and appropriate mechanics. Some muscle fatigue in R>L LE as expected, improves with mobility work at end of session. No adverse events, pt does not endorse any increase in pain on departure and notes she is pleased with functional progress. Recommend continuing along current POC in order to address relevant deficits and improve functional tolerance. Pt departs today's session in  no acute distress, all voiced questions/concerns addressed appropriately from PT perspective.    Eval: Patient is a 58 y.o. F who was seen today for physical therapy evaluation and treatment for Rt hip pain following an incident on skiis that resulted in strained ER through bilateral LEs. Multiple pelvic fractures and strain through adductors, history of lumbar spine surgical intervention all contributing to pain. Pt does have known tearing in hip labrum but is feeling much better since the injection and would like to avoid surgery if possible.  Pt was very active prior to the accident and would like to return to PLOF. Pt also notes Rt shoulder pain onset since the fall- notable postural changes through thoracic spine creating biomechanical chain input from pelvis to shoulder.     REHAB POTENTIAL: Good  CLINICAL DECISION MAKING: Stable/uncomplicated  EVALUATION COMPLEXITY: Low   GOALS: Goals reviewed with patient? Yes  SHORT TERM GOALS: Target date: 3/22  Able to ambulate household distances, without AD, without antalgic pattern Baseline: Goal status: IN PROGRESS (weaning off FWW to single crutch 3/21)  2.  Demo proper core control with exercises Baseline:  Goal status: IN PROGRESS 3/21    LONG TERM GOALS: Target date: POC date  Hip strength 90% of left side Baseline: MMT to be performed as appropriate Goal status: INITIAL  2.  Progressing into plyometric motions with minimal to no pain Baseline:  Goal status: INITIAL  3.  Return to work with understanding of exercises & rest breaks to reduce discomfort Baseline:  Goal status: IN PROGRESS  4.  Navigate uneven surfaces with minimal to no pain Baseline:  Goal status: INITIAL     PLAN:  PT FREQUENCY: 1-2x/week  PT DURATION: 12 weeks  PLANNED INTERVENTIONS: 97164- PT Re-evaluation, 97110-Therapeutic exercises, 97530- Therapeutic activity, 97112- Neuromuscular re-education, 97535- Self Care, [id number]- Manual therapy,  [phone]- Gait training, [phone]- Aquatic Therapy, [phone]- Electrical stimulation (unattended), Patient/Family education, Balance training, Stair training, Taping, Dry Needling, Joint mobilization, Spinal mobilization, Cryotherapy, and Moist heat.  PLAN FOR NEXT SESSION: consider aquatics. Pt also interested in interventions looking at hamstring tightness, massage therapy.    Ashley Murrain PT, DPT 09/19/2023 2:33 PM

## 2023-09-19 NOTE — Progress Notes (Signed)
   Rubin Payor, PhD, LAT, ATC acting as a scribe for [name], MD.  Maria Evans is a 58 y.o. female who presents to [facility] today for cont'd R shoulder pain. Pt was last seen by Dr. Denyse Amass on 08/11/23 and was given a R subacromial steroid injection and prescribed oxycodone and meloxicam.  Today, pt reports Friday she was able to stop using the crutch and walker. She has been working hard at [employer]. She is unsure about trying another should injection, as she is feeling the side-effect of all the meds she has been taking.    She notes she had a migraine headache that was really bad.  She does have a history of migraines but it has been quite a while since she had any significant migraine headaches. Dx imaging: 08/11/23 R shoulder XR  Pertinent review of systems: No fevers or chills  Relevant historical information: Diabetes and hypothyroidism.   Exam:  BP 138/86   Pulse 80   Ht 5' 8.75" (1.746 m)   Wt 149 lb (67.6 kg)   LMP 02/07/2016   SpO2 97%   BMI 22.16 kg/m  General: Well Developed, well nourished, and in no acute distress.   MSK: Right shoulder normal motion pain with abduction.     Assessment and Plan: 58 y.o. female with multiple orthopedic injuries as a result of a skiing incident/injury occurring in January 2025.  She suffered nondisplaced pelvic fractures and sacral fractures and a shoulder injury.  Her pelvis is finally feeling better.  If she has been able to discontinue crutches her shoulder is now feeling a lot better.  She originally was scheduled to consider injection today but feels well enough that she would like to hold off on that for now.  We spent time talking about her migraine headaches.  She has a history of migraines but had been doing pretty well recently.  She is having more headaches now.  This could be migraine or could be medication rebound headaches.  I did prescribe Maxalt that she can use if needed.   She has had benefit with that in the past.  Watchful waiting for now.  She is traveling to [facility] on April 22.  She will schedule with me on April 21 for recheck and be ready to do any kind of special treatment needed at that time.   PDMP not reviewed this encounter. No orders of the defined types were placed in this encounter.  Meds ordered this encounter  Medications   rizatriptan (MAXALT-MLT) 10 MG disintegrating tablet    Sig: Take 1 tablet (10 mg total) by mouth as needed for migraine. May repeat once in 2 hours if needed.    Dispense:  10 tablet    Refill:  3     Discussed warning signs or symptoms. Please see discharge instructions. Patient expresses understanding.   The above documentation has been reviewed and is accurate and complete [name], M.D.

## 2023-09-22 ENCOUNTER — Other Ambulatory Visit: Payer: Self-pay

## 2023-09-22 ENCOUNTER — Ambulatory Visit (HOSPITAL_BASED_OUTPATIENT_CLINIC_OR_DEPARTMENT_OTHER): Payer: Commercial Managed Care - PPO

## 2023-09-22 ENCOUNTER — Other Ambulatory Visit (HOSPITAL_COMMUNITY): Payer: Self-pay

## 2023-09-22 ENCOUNTER — Ambulatory Visit: Admitting: Family Medicine

## 2023-09-22 ENCOUNTER — Encounter (HOSPITAL_BASED_OUTPATIENT_CLINIC_OR_DEPARTMENT_OTHER): Payer: Self-pay

## 2023-09-22 VITALS — BP 138/86 | HR 80 | Ht 68.75 in | Wt 149.0 lb

## 2023-09-22 DIAGNOSIS — G8929 Other chronic pain: Secondary | ICD-10-CM | POA: Diagnosis not present

## 2023-09-22 DIAGNOSIS — R293 Abnormal posture: Secondary | ICD-10-CM

## 2023-09-22 DIAGNOSIS — R262 Difficulty in walking, not elsewhere classified: Secondary | ICD-10-CM

## 2023-09-22 DIAGNOSIS — G43009 Migraine without aura, not intractable, without status migrainosus: Secondary | ICD-10-CM

## 2023-09-22 DIAGNOSIS — M25561 Pain in right knee: Secondary | ICD-10-CM

## 2023-09-22 DIAGNOSIS — M6281 Muscle weakness (generalized): Secondary | ICD-10-CM

## 2023-09-22 DIAGNOSIS — M25511 Pain in right shoulder: Secondary | ICD-10-CM | POA: Diagnosis not present

## 2023-09-22 DIAGNOSIS — G43909 Migraine, unspecified, not intractable, without status migrainosus: Secondary | ICD-10-CM | POA: Insufficient documentation

## 2023-09-22 DIAGNOSIS — M810 Age-related osteoporosis without current pathological fracture: Secondary | ICD-10-CM | POA: Insufficient documentation

## 2023-09-22 DIAGNOSIS — M25551 Pain in right hip: Secondary | ICD-10-CM

## 2023-09-22 MED ORDER — RIZATRIPTAN BENZOATE 10 MG PO TBDP
10.0000 mg | ORAL_TABLET | ORAL | 3 refills | Status: AC | PRN
  Filled 2023-09-22: qty 10, 5d supply, fill #0

## 2023-09-22 NOTE — Therapy (Signed)
 OUTPATIENT PHYSICAL THERAPY TREATMENT   Patient Name: JAILEE JAQUEZ MRN: [id number] DOB:1965/11/16, 58 y.o., female Today's Date: 09/22/2023  END OF SESSION:  PT End of Session - 09/22/23 1601     Visit Number 8    Number of Visits 25    Date for PT Re-Evaluation 11/15/23    Authorization Type [facility] Aet[employer]    PT Start Time 1519    PT Stop Time 1600    PT Time Calculation (min) 41 min    Activity Tolerance Patient tolerated treatment well    Behavior During Therapy WFL for tasks assessed/performed                    Past Medical History:  Diagnosis Date   Anxiety    Anxiety with depression    Controlled.   Common migraine without aura    Diabetes mellitus type I, controlled (HCC)    Treated with 4 times daily insulin based on blood sugar monitoring   Hip sprain    & contusion   Hyperlipidemia due to type 1 diabetes mellitus (HCC)    Currently on atorvastatin.   Hypothyroid    Lipoma    MVP (mitral valve prolapse) 2005   Renally diagnosed back in 2005 (during her pregnancy)_, most recently evaluated in 2012.  [facility] - Cardiology, Dr. Emily Filbert); 11/2010 TTE: Moderate MR, posteriorly directed (recommend TEE) => TEE (01/2011) - mild MVP (anterior leaflet) with Mild-Mod MR.;    Past Surgical History:  Procedure Laterality Date   LAPAROSCOPIC LYSIS OF ADHESIONS     LIPOMA EXCISION Left    LUMBAR MICRODISCECTOMY     TRANSESOPHAGEAL ECHOCARDIOGRAM  02/13/2011   [facility] -> Dr. Emily Filbert [facility] Phys[facility]-Cardiology): EF 55 to 60%.  Borderline LA dilation.  Prolapse of A2 scallop of mitral valve-mild MVP with mild to moderate MR.  (2 eccentric jets, posteriorly directed, c/w anterior leaflet pathology))   TRANSTHORACIC ECHOCARDIOGRAM  12/11/2010   [facility] -> Dr. Emily Filbert [facility] Phys[facility]-Cardiology): EF 35 to 6%.  Mild LA dilation.  Moderate MR-eccentric/possibly directed jet consistent with  antilipid pathology.  Mild TR, LP HTN, mild PR.  Recommend TEE.   TREADMILL STRESS ECHOCARDIOGRAM     [facility] -> Dr. Emily Filbert [facility] Phys[facility]-Cardiology): Exercised 10 minutes (11 minutes), achieved Max Heart Rate 162 = 92% Max Predicted Heart Rate, clinically and electrocardiographically negative.  Nonspecific ST and T wave changes.  Prestress echo showed EF 55 to 60% with normal wall motion.  Post-Stress: EF 65 to 70% with nl augmentation of all walls.=> NORMAL STRESS [facility]   Patient Active Problem List   Diagnosis Date Noted   Osteoporosis 09/22/2023   Migraine headache 09/22/2023   Hypothyroidism 11/09/2021   Type 1 diabetes mellitus with hyperglycemia (HCC) 08/22/2020   History of mitral valve prolapse in adulthood 08/23/2010     REFERRING PROVIDER:  Huel Cote, MD     REFERRING DIAG: M25.351 (ICD-10-CM) - Hip instability, right   Rationale for Evaluation and Treatment: Rehabilitation  THERAPY DIAG:  Pain in right hip  Difficulty in walking, not elsewhere classified  Abnormal posture  Muscle weakness (generalized)  Acute pain of right shoulder  Pain in joint of right knee  ONSET DATE: 07/21/23   SUBJECTIVE:  SUBJECTIVE STATEMENT: 09/22/2023 Saw Dr. Denyse Amass this morning. Did not have steroid injection. Has weaned off percocet and is trying to wean off of ibuprofen. Has been having migraines and nausea. She arrives without AD. States she is getting around better. "Things are loosening up." Reports significant improvement in sacral pain.    Eval: T1DM and maintain a good AI1. 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 I like to go sailing and skiing, enjoys trail walking around [facility]. Has 4 dogs and husband is an avid exerciser. I have had multiple LE fx but have been  trauma-related. Bilat 5th mets, Rt ankle fx 2 locations. Has had back surgery at L4-5 discectomy. Jan 27th went on a family trip- skiing on new equipment- fell back, more on right side with bruise on Rt shoulder. I think the back of my skiis crossed and opened anteriorly and knees splayed out to strain groin muscles. Bindings did not release in fall. Hauled out by ski patrol and was in [facility]- ER xrays denied fx and sent me out on crutches.  After a week or 2 was not improving and tailbone felt like it was on fire. MRI showed bilat fractures, including sacrum. Did not miss a day at work and was unable to tolerate pain on walker. Developed tendonitis in right shoulder walking around the hospital on crutches. The 17th of Feb, Rt shoulder steroid injection. Discovered hip instability when I stepped into the tub-shower with right foot and felt something shift. When I rolled back to put my socks on, both hips shifted/popped/caught and had 10/10 pain and spent the day in bed. Dr B gave me a steroid shot in right hip on Monday and have had dramatic improvement. Was able to tolerate laying on her side for about 15 min last night. Walked short distances at home without RW.   PERTINENT HISTORY:  H/o ankle fx without surgery. T1DM Lt shoulder bursitis with PT in the past Injection to Rt shoulder recently for tendonitis   PAIN:  Are you having pain? Yes: NPRS scale: no pain at present, tightness in hamstrings Pain location: Rt hip, tailbone, Lateral Lt thigh sensation Pain description: I feel a sensation down lateral Lt thigh Aggravating factors: walking Relieving factors: reduced WB, injection  PRECAUTIONS:  None  RED FLAGS: None   WEIGHT BEARING RESTRICTIONS:  WBAT  FALLS:  Has patient fallen in last 6 months? Yes. 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  DIAGNOSTIC FINDINGS:  DG hip 08/14/23: Fractures of the bilateral puboacetabular junction, inferior pubic rami and right sacrum a MRI are not well-defined by radiograph. The right inferior ramus fracture is potentially visualized, the other fractures are radiographically occult. There is no hip dislocation. No pubic symphyseal or sacroiliac diastasis.  PATIENT SURVEYS:  LEFS 18  COGNITIVE STATUS: Within functional limits for tasks assessed   SENSATION: WFL  POSTURE:  Standing: Rt shoulder depression, decreased thoracic kyphosis & lumbar lordosis, notable pelvic rotation Supine: Lt leg functionally shorter, Rt anterior innom  HAND DOMINANCE:  Right  GAIT: Eval: RW, slow cadence, short stride    TREATMENT DATE:    [facility] Adult PT Treatment:  DATE: 09/22/23 Therapeutic Exercise: Nu step L5 LE only 6" step up 2x10ea Single HR 2x10L, 2x6 R Standing hip adductor stretching  Standing marches 2x10 Partial squats at rail 2x5 Supine active hamstring stretch x10 BIL Bridge 2x10 Butterfly stretch    Therapeutic Activity: STS x10 cues for symmetrical WB 6 inch step up BIL w/ UE support CGA x6 BIL Ambulation in clinic with cues for appropriate weight shifting and posture Continued education re: pacing of activities, symptom behavior as pertains to functional tolerance   [facility] Adult PT Treatment:                                                DATE: 09/19/23 Therapeutic Exercise: Nu step L3 LE only Supine active hamstring stretch x10 BIL Butterfly stretch 2x45min  HEP discussion, rationale for interventions  Therapeutic Activity: STS x10 cues for symmetrical WB 6 inch step up BIL w/ UE support CGA x6 BIL Ambulation in clinic with cues for appropriate weight shifting and posture Continued education re: pacing of activities, symptom behavior as pertains to functional  tolerance     [facility] Adult PT Treatment:                                                DATE: 09/12/23 Therapeutic Exercise: PROM R shoulder Supine HSS Manual adductor stretch Nu-step L3 x10min (Tried UE and LE, but Ue's became painful so switched to Les)  Heel toe rocking/raises x10 Step ups 4" with UE support x10ea Glute set with parital bridge 2x10   [facility] Adult PT Treatment:                                                DATE: 08/1923  Nustep Lvl 2 5 min  Heel toe rocking 3x8 with seated rest breaks Step up on 4" box and UE support 10x with seated rest break  AD sizing and usage with gait 47ft with emphasis of step to pattern  Edu of walking interval program, walking at work, safety with building walking tolerance.     [facility] Adult PT Treatment:                                                DATE: 09/02/23 Therapeutic Exercise: Active hamstring stretch supine x12 BIL  Hooklying butterfly stretch 3x30sec within comfortable ROM  HEP update + education/handout  Neuromuscular re-ed: Hooklying glute set x12 w/ 3 sec hold  Glute set + mini bridge x8 cues for comfortable ROM DKTC w/ swiss ball assist emphasis on core contraction 2x8 cues for breath control  Hooklying yellow band pulldown x8 cues for core contraction and breath control    [facility] Adult PT Treatment:  DATE: 08/26/23  Neuromuscular re-ed: Hooklying adduction isometric + core contraction x8 cues for gentle/comfortable force output, breath control Hooklying core contraction + alternating UE raise x5 each UE  Hooklying shoulder elevation w/ gentle shoulder adduction iso into ball x8 Seated glute sets 3x5   Self Care: Education on gradual progression of activities with respect to symptom behavior, pacing of tasks, activity modification as needed    PATIENT EDUCATION:  Education details: rationale for interventions, HEP  Person educated: Patient Education method:  Explanation, Demonstration, Tactile cues, Verbal cues Education comprehension: verbalized understanding, returned demonstration, verbal cues required, tactile cues required, and needs further education     HOME EXERCISE PROGRAM: [id number] Hesch self correction for Rt anterior innominate rotation   ASSESSMENT:  CLINICAL IMPRESSION: 09/22/2023 patient able to progress today with good tolerance.  Patient does fatigue quickly with strengthening exercises so provided adequate rest breaks during session.  Trialed partial squats today with good form and no pain reported.  Discussed with patient healing expectations and to continue modifying exercises at home as needed.  She continues to ambulate with deviated pattern which she states worsens when she is fatigued.  Patient plans to have official referral for PT  for right shoulder.  Eval: Patient is a 58 y.o. F who was seen today for physical therapy evaluation and treatment for Rt hip pain following an incident on skiis that resulted in strained ER through bilateral LEs. Multiple pelvic fractures and strain through adductors, history of lumbar spine surgical intervention all contributing to pain. Pt does have known tearing in hip labrum but is feeling much better since the injection and would like to avoid surgery if possible.  Pt was very active prior to the accident and would like to return to PLOF. Pt also notes Rt shoulder pain onset since the fall- notable postural changes through thoracic spine creating biomechanical chain input from pelvis to shoulder.     REHAB POTENTIAL: Good  CLINICAL DECISION MAKING: Stable/uncomplicated  EVALUATION COMPLEXITY: Low   GOALS: Goals reviewed with patient? Yes  SHORT TERM GOALS: Target date: 3/22  Able to ambulate household distances, without AD, without antalgic pattern Baseline: Goal status: IN PROGRESS (weaning off FWW to single crutch 3/21)  2.  Demo proper core control with exercises Baseline:   Goal status: IN PROGRESS 3/21    LONG TERM GOALS: Target date: POC date  Hip strength 90% of left side Baseline: MMT to be performed as appropriate Goal status: INITIAL  2.  Progressing into plyometric motions with minimal to no pain Baseline:  Goal status: INITIAL  3.  Return to work with understanding of exercises & rest breaks to reduce discomfort Baseline:  Goal status: IN PROGRESS  4.  Navigate uneven surfaces with minimal to no pain Baseline:  Goal status: INITIAL     PLAN:  PT FREQUENCY: 1-2x/week  PT DURATION: 12 weeks  PLANNED INTERVENTIONS: 97164- PT Re-evaluation, 97110-Therapeutic exercises, 97530- Therapeutic activity, 97112- Neuromuscular re-education, 97535- Self Care, 54098- Manual therapy, [phone]- Gait training, [phone]- Aquatic Therapy, [phone]- Electrical stimulation (unattended), Patient/Family education, Balance training, Stair training, Taping, Dry Needling, Joint mobilization, Spinal mobilization, Cryotherapy, and Moist heat.  PLAN FOR NEXT SESSION: consider aquatics. Pt also interested in interventions looking at hamstring tightness, massage therapy.    Riki Altes, PTA

## 2023-09-22 NOTE — Patient Instructions (Addendum)
 Thank you for coming in today.   Try the maxalt for migraine.   Try to wean off the ibuprofen   Schedule April 21 so if you need me before that trip we have an appointment.  Easy to cancel.

## 2023-09-25 ENCOUNTER — Ambulatory Visit (HOSPITAL_BASED_OUTPATIENT_CLINIC_OR_DEPARTMENT_OTHER): Payer: Commercial Managed Care - PPO | Attending: Orthopaedic Surgery | Admitting: Physical Therapy

## 2023-09-25 ENCOUNTER — Encounter (HOSPITAL_BASED_OUTPATIENT_CLINIC_OR_DEPARTMENT_OTHER): Payer: Self-pay | Admitting: Physical Therapy

## 2023-09-25 DIAGNOSIS — M25511 Pain in right shoulder: Secondary | ICD-10-CM | POA: Insufficient documentation

## 2023-09-25 DIAGNOSIS — M6281 Muscle weakness (generalized): Secondary | ICD-10-CM | POA: Diagnosis present

## 2023-09-25 DIAGNOSIS — M25551 Pain in right hip: Secondary | ICD-10-CM | POA: Insufficient documentation

## 2023-09-25 DIAGNOSIS — M25561 Pain in right knee: Secondary | ICD-10-CM | POA: Insufficient documentation

## 2023-09-25 DIAGNOSIS — R262 Difficulty in walking, not elsewhere classified: Secondary | ICD-10-CM | POA: Insufficient documentation

## 2023-09-25 DIAGNOSIS — R293 Abnormal posture: Secondary | ICD-10-CM | POA: Diagnosis present

## 2023-09-25 NOTE — Therapy (Signed)
 OUTPATIENT PHYSICAL THERAPY TREATMENT   Patient Name: Maria Evans MRN: [id number] DOB:[birth year], 58 y.o., female Today's Date: 09/25/2023  END OF SESSION:  PT End of Session - 09/25/23 1516     Visit Number 9    Number of Visits 25    Date for PT Re-Evaluation 11/15/23    Authorization Type [facility] Aet[employer]    PT Start Time 1515    PT Stop Time 1559    PT Time Calculation (min) 44 min    Activity Tolerance Patient tolerated treatment well    Behavior During Therapy WFL for tasks assessed/performed                     Past Medical History:  Diagnosis Date   Anxiety    Anxiety with depression    Controlled.   Common migraine without aura    Diabetes mellitus type I, controlled (HCC)    Treated with 4 times daily insulin based on blood sugar monitoring   Hip sprain    & contusion   Hyperlipidemia due to type 1 diabetes mellitus (HCC)    Currently on atorvastatin.   Hypothyroid    Lipoma    MVP (mitral valve prolapse) 2005   Renally diagnosed back in 2005 (during her pregnancy)_, most recently evaluated in 2012.  [facility] Phys[facility] - Cardiology, Dr. Emily Filbert); 11/2010 TTE: Moderate MR, posteriorly directed (recommend TEE) => TEE (01/2011) - mild MVP (anterior leaflet) with Mild-Mod MR.;    Past Surgical History:  Procedure Laterality Date   LAPAROSCOPIC LYSIS OF ADHESIONS     LIPOMA EXCISION Left    LUMBAR MICRODISCECTOMY     TRANSESOPHAGEAL ECHOCARDIOGRAM  02/13/2011   [facility] -> Dr. Emily Filbert [facility] Physicians-Cardiology): EF 55 to 60%.  Borderline LA dilation.  Prolapse of A2 scallop of mitral valve-mild MVP with mild to moderate MR.  (2 eccentric jets, posteriorly directed, c/w anterior leaflet pathology))   TRANSTHORACIC ECHOCARDIOGRAM  12/11/2010   [facility] -> Dr. Emily Filbert [facility] Physicians-Cardiology): EF 35 to 6%.  Mild LA dilation.  Moderate MR-eccentric/possibly directed jet consistent with  antilipid pathology.  Mild TR, LP HTN, mild PR.  Recommend TEE.   TREADMILL STRESS ECHOCARDIOGRAM     [facility] -> Dr. Emily Filbert [facility] Physicians-Cardiology): Exercised 10 minutes (11 minutes), achieved Max Heart Rate 162 = 92% Max Predicted Heart Rate, clinically and electrocardiographically negative.  Nonspecific ST and T wave changes.  Prestress echo showed EF 55 to 60% with normal wall motion.  Post-Stress: EF 65 to 70% with nl augmentation of all walls.=> NORMAL STRESS [facility]   Patient Active Problem List   Diagnosis Date Noted   Osteoporosis 09/22/2023   Migraine headache 09/22/2023   Hypothyroidism 11/09/2021   Type 1 diabetes mellitus with hyperglycemia (HCC) 08/22/2020   History of mitral valve prolapse in adulthood 08/23/2010     REFERRING PROVIDER:  Huel Cote, MD     REFERRING DIAG: M25.351 (ICD-10-CM) - Hip instability, right   Rationale for Evaluation and Treatment: Rehabilitation  THERAPY DIAG:  Pain in right hip  Difficulty in walking, not elsewhere classified  Abnormal posture  ONSET DATE: 07/21/23   SUBJECTIVE:  SUBJECTIVE STATEMENT: No pain has shot through the roof. Hoping the shoulder will improve now that I am off of the walker. Taking my crutch to get into/out of dept and any longer walks. I can mostly stay in the pharmacy.    Eval: T1DM and maintain a good AI1. 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 I like to go sailing and skiing, enjoys trail walking around [facility]. Has 4 dogs and husband is an avid exerciser. I have had multiple LE fx but have been trauma-related. Bilat 5th mets, Rt ankle fx 2 locations. Has had back surgery at L4-5 discectomy. Jan 27th went on a family trip- skiing on new equipment- fell back, more on right side with bruise on Rt  shoulder. I think the back of my skiis crossed and opened anteriorly and knees splayed out to strain groin muscles. Bindings did not release in fall. Hauled out by ski patrol and was in [facility]- ER xrays denied fx and sent me out on crutches.  After a week or 2 was not improving and tailbone felt like it was on fire. MRI showed bilat fractures, including sacrum. Did not miss a day at work and was unable to tolerate pain on walker. Developed tendonitis in right shoulder walking around the hospital on crutches. The 17th of Feb, Rt shoulder steroid injection. Discovered hip instability when I stepped into the tub-shower with right foot and felt something shift. When I rolled back to put my socks on, both hips shifted/popped/caught and had 10/10 pain and spent the day in bed. Dr B gave me a steroid shot in right hip on Monday and have had dramatic improvement. Was able to tolerate laying on her side for about 15 min last night. Walked short distances at home without RW.   PERTINENT HISTORY:  H/o ankle fx without surgery. T1DM Lt shoulder bursitis with PT in the past Injection to Rt shoulder recently for tendonitis   PAIN:  Are you having pain? Yes: NPRS scale: no pain at present, tightness in hamstrings Pain location: Rt hip, tailbone, Lateral Lt thigh sensation Pain description: I feel a sensation down lateral Lt thigh Aggravating factors: walking Relieving factors: reduced WB, injection  PRECAUTIONS:  None  RED FLAGS: None   WEIGHT BEARING RESTRICTIONS:  WBAT  FALLS:  Has patient fallen in last 6 months? Yes. 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  DIAGNOSTIC FINDINGS:  DG hip 08/14/23: Fractures of the bilateral puboacetabular junction, inferior pubic rami and right sacrum a MRI are not well-defined by  radiograph. The right inferior ramus fracture is potentially visualized, the other fractures are radiographically occult. There is no hip dislocation. No pubic symphyseal or sacroiliac diastasis.  PATIENT SURVEYS:  LEFS 18 09/25/23: 53/80   COGNITIVE STATUS: Within functional limits for tasks assessed   SENSATION: WFL  POSTURE:  Standing: Rt shoulder depression, decreased thoracic kyphosis & lumbar lordosis, notable pelvic rotation Supine: Lt leg functionally shorter, Rt anterior innom  HAND DOMINANCE:  Right  GAIT: Eval: RW, slow cadence, short stride    TREATMENT DATE:   Treatment                            3/27: Blank lines following charge title = not provided on this treatment date.   Manual:  TPDN No  There-ex: Seated hamstring & piriformis stretch Squats- cues for hip hinge Sumo squat There-Act: Discussed upcoming [place] trip LEFS Discussed work and rest breaks/ exercises during the day Self Care:  Nuro-Re-ed:  Investment banker, operational: Glut set in heel strike through stance phase   [facility] Adult PT Treatment:                                                DATE: 09/22/23 Therapeutic Exercise: Nu step L5 LE only 6" step up 2x10ea Single HR 2x10L, 2x6 R Standing hip adductor stretching  Standing marches 2x10 Partial squats at rail 2x5 Supine active hamstring stretch x10 BIL Bridge 2x10 Butterfly stretch    Therapeutic Activity: STS x10 cues for symmetrical WB 6 inch step up BIL w/ UE support CGA x6 BIL Ambulation in clinic with cues for appropriate weight shifting and posture Continued education re: pacing of activities, symptom behavior as pertains to functional tolerance   [facility] Adult PT Treatment:                                                DATE: 09/19/23 Therapeutic Exercise: Nu step L3 LE only Supine active hamstring stretch x10 BIL Butterfly stretch 2x30min  HEP discussion, rationale for interventions  Therapeutic Activity: STS x10 cues  for symmetrical WB 6 inch step up BIL w/ UE support CGA x6 BIL Ambulation in clinic with cues for appropriate weight shifting and posture Continued education re: pacing of activities, symptom behavior as pertains to functional tolerance     PATIENT EDUCATION:  Education details: rationale for interventions, HEP  Person educated: Patient Education method: Explanation, Demonstration, Tactile cues, Verbal cues Education comprehension: verbalized understanding, returned demonstration, verbal cues required, tactile cues required, and needs further education     HOME EXERCISE PROGRAM: [id number] Hesch self correction for Rt anterior innominate rotation   ASSESSMENT:  CLINICAL IMPRESSION: Tendency toward bilateral LE IR that may have been present prior to injury. Tolerated progressions well today and will work on Artist in gait.   Eval: Patient is a 58 y.o. F who was seen today for physical therapy evaluation and treatment for Rt hip pain following an incident on skiis that resulted in strained ER through bilateral LEs. Multiple pelvic fractures and strain through adductors, history of lumbar spine surgical intervention all contributing to pain. Pt does have known tearing in hip labrum but is feeling much better since the injection and would like to avoid surgery if possible.  Pt was very active prior to the accident and would like to return to PLOF. Pt also notes Rt shoulder pain onset since the fall- notable postural changes through thoracic spine creating biomechanical chain input from pelvis to shoulder.     REHAB POTENTIAL: Good  CLINICAL DECISION MAKING: Stable/uncomplicated  EVALUATION COMPLEXITY: Low   GOALS: Goals reviewed with patient? Yes  SHORT TERM GOALS: Target date: 3/22  Able to ambulate household distances, without AD, without antalgic pattern Baseline: Goal status: IN PROGRESS (weaning off FWW to single crutch 3/21)  2.  Demo proper core control with  exercises Baseline:  Goal status: IN PROGRESS 3/21    LONG TERM GOALS: Target date: POC date  Hip strength 90%  of left side Baseline: MMT to be performed as appropriate Goal status: INITIAL  2.  Progressing into plyometric motions with minimal to no pain Baseline:  Goal status: INITIAL  3.  Return to work with understanding of exercises & rest breaks to reduce discomfort Baseline:  Goal status: IN PROGRESS  4.  Navigate uneven surfaces with minimal to no pain Baseline:  Goal status: INITIAL     PLAN:  PT FREQUENCY: 1-2x/week  PT DURATION: 12 weeks  PLANNED INTERVENTIONS: 97164- PT Re-evaluation, 97110-Therapeutic exercises, 97530- Therapeutic activity, 97112- Neuromuscular re-education, 97535- Self Care, 16109- Manual therapy, [phone]- Gait training, [phone]- Aquatic Therapy, [phone]- Electrical stimulation (unattended), Patient/Family education, Balance training, Stair training, Taping, Dry Needling, Joint mobilization, Spinal mobilization, Cryotherapy, and Moist heat.  PLAN FOR NEXT SESSION: consider aquatics. Pt also interested in interventions looking at hamstring tightness, massage therapy.   Tonetta Napoles C. Waldemar Siegel PT, DPT 09/25/23 4:01 PM

## 2023-09-26 DIAGNOSIS — E1165 Type 2 diabetes mellitus with hyperglycemia: Secondary | ICD-10-CM | POA: Diagnosis not present

## 2023-09-26 DIAGNOSIS — E785 Hyperlipidemia, unspecified: Secondary | ICD-10-CM | POA: Diagnosis not present

## 2023-09-26 DIAGNOSIS — Z0000 Encounter for general adult medical examination without abnormal findings: Secondary | ICD-10-CM | POA: Diagnosis not present

## 2023-09-26 DIAGNOSIS — E039 Hypothyroidism, unspecified: Secondary | ICD-10-CM | POA: Diagnosis not present

## 2023-09-30 ENCOUNTER — Ambulatory Visit (HOSPITAL_BASED_OUTPATIENT_CLINIC_OR_DEPARTMENT_OTHER): Payer: Commercial Managed Care - PPO | Admitting: Physical Therapy

## 2023-09-30 ENCOUNTER — Other Ambulatory Visit (HOSPITAL_COMMUNITY): Payer: Self-pay

## 2023-09-30 ENCOUNTER — Other Ambulatory Visit: Payer: Self-pay | Admitting: Internal Medicine

## 2023-09-30 DIAGNOSIS — Z7185 Encounter for immunization safety counseling: Secondary | ICD-10-CM | POA: Diagnosis not present

## 2023-09-30 DIAGNOSIS — M6281 Muscle weakness (generalized): Secondary | ICD-10-CM

## 2023-09-30 DIAGNOSIS — E1165 Type 2 diabetes mellitus with hyperglycemia: Secondary | ICD-10-CM | POA: Diagnosis not present

## 2023-09-30 DIAGNOSIS — M25551 Pain in right hip: Secondary | ICD-10-CM | POA: Diagnosis not present

## 2023-09-30 DIAGNOSIS — R002 Palpitations: Secondary | ICD-10-CM | POA: Diagnosis not present

## 2023-09-30 DIAGNOSIS — E039 Hypothyroidism, unspecified: Secondary | ICD-10-CM | POA: Diagnosis not present

## 2023-09-30 DIAGNOSIS — E139 Other specified diabetes mellitus without complications: Secondary | ICD-10-CM | POA: Diagnosis not present

## 2023-09-30 DIAGNOSIS — E785 Hyperlipidemia, unspecified: Secondary | ICD-10-CM | POA: Diagnosis not present

## 2023-09-30 DIAGNOSIS — F419 Anxiety disorder, unspecified: Secondary | ICD-10-CM | POA: Diagnosis not present

## 2023-09-30 DIAGNOSIS — R262 Difficulty in walking, not elsewhere classified: Secondary | ICD-10-CM

## 2023-09-30 DIAGNOSIS — I341 Nonrheumatic mitral (valve) prolapse: Secondary | ICD-10-CM | POA: Diagnosis not present

## 2023-09-30 DIAGNOSIS — E1065 Type 1 diabetes mellitus with hyperglycemia: Secondary | ICD-10-CM

## 2023-09-30 DIAGNOSIS — R293 Abnormal posture: Secondary | ICD-10-CM

## 2023-09-30 DIAGNOSIS — J3089 Other allergic rhinitis: Secondary | ICD-10-CM | POA: Diagnosis not present

## 2023-09-30 DIAGNOSIS — Z0000 Encounter for general adult medical examination without abnormal findings: Secondary | ICD-10-CM | POA: Diagnosis not present

## 2023-09-30 MED ORDER — LEVOCETIRIZINE DIHYDROCHLORIDE 5 MG PO TABS
5.0000 mg | ORAL_TABLET | Freq: Every evening | ORAL | 3 refills | Status: AC
  Filled 2023-10-19: qty 90, 90d supply, fill #0
  Filled 2024-01-22: qty 90, 90d supply, fill #1
  Filled 2024-04-19: qty 90, 90d supply, fill #2

## 2023-09-30 MED ORDER — ALPRAZOLAM 0.5 MG PO TABS
0.5000 mg | ORAL_TABLET | Freq: Every day | ORAL | 0 refills | Status: DC | PRN
  Filled 2023-09-30: qty 30, 30d supply, fill #0

## 2023-09-30 NOTE — Therapy (Signed)
 OUTPATIENT PHYSICAL THERAPY TREATMENT   Patient Name: Maria Evans MRN: [id number] DOB:[birth year], 58 y.o., female Today's Date: 09/30/2023  END OF SESSION:  PT End of Session - 09/30/23 0848     Visit Number 10    Number of Visits 25    Date for PT Re-Evaluation 11/15/23    Authorization Type [facility] Aet[employer]    PT Start Time 0848    PT Stop Time 0926    PT Time Calculation (min) 38 min    Activity Tolerance Patient tolerated treatment well    Behavior During Therapy [facility] for tasks assessed/performed              Past Medical History:  Diagnosis Date   Anxiety    Anxiety with depression    Controlled.   Common migraine without aura    Diabetes mellitus type I, controlled (HCC)    Treated with 4 times daily insulin based on blood sugar monitoring   Hip sprain    & contusion   Hyperlipidemia due to type 1 diabetes mellitus (HCC)    Currently on atorvastatin.   Hypothyroid    Lipoma    MVP (mitral valve prolapse) 2005   Renally diagnosed back in 2005 (during her pregnancy)_, most recently evaluated in 2012.  [facility] - Cardiology, Dr. Emily Filbert); 11/2010 TTE: Moderate MR, posteriorly directed (recommend TEE) => TEE (01/2011) - mild MVP (anterior leaflet) with Mild-Mod MR.;    Past Surgical History:  Procedure Laterality Date   LAPAROSCOPIC LYSIS OF ADHESIONS     LIPOMA EXCISION Left    LUMBAR MICRODISCECTOMY     TRANSESOPHAGEAL ECHOCARDIOGRAM  02/13/2011   [facility] -> Dr. Emily Filbert [facility]-Cardiology): EF 55 to 60%.  Borderline LA dilation.  Prolapse of A2 scallop of mitral valve-mild MVP with mild to moderate MR.  (2 eccentric jets, posteriorly directed, c/w anterior leaflet pathology))   TRANSTHORACIC ECHOCARDIOGRAM  12/11/2010   [facility] -> Dr. Emily Filbert [facility]-Cardiology): EF 35 to 6%.  Mild LA dilation.  Moderate MR-eccentric/possibly directed jet consistent with antilipid  pathology.  Mild TR, LP HTN, mild PR.  Recommend TEE.   TREADMILL STRESS ECHOCARDIOGRAM     [facility] -> Dr. Emily Filbert [facility]-Cardiology): Exercised 10 minutes (11 minutes), achieved Max Heart Rate 162 = 92% Max Predicted Heart Rate, clinically and electrocardiographically negative.  Nonspecific ST and T wave changes.  Prestress echo showed EF 55 to 60% with normal wall motion.  Post-Stress: EF 65 to 70% with nl augmentation of all walls.=> NORMAL STRESS [facility]   Patient Active Problem List   Diagnosis Date Noted   Osteoporosis 09/22/2023   Migraine headache 09/22/2023   Hypothyroidism 11/09/2021   Type 1 diabetes mellitus with hyperglycemia (HCC) 08/22/2020   History of mitral valve prolapse in adulthood 08/23/2010     REFERRING PROVIDER:  Huel Cote, MD     REFERRING DIAG: M25.351 (ICD-10-CM) - Hip instability, right   Rationale for Evaluation and Treatment: Rehabilitation  THERAPY DIAG:  Pain in right hip  Difficulty in walking, not elsewhere classified  Abnormal posture  Muscle weakness (generalized)  ONSET DATE: 07/21/23   SUBJECTIVE:  SUBJECTIVE STATEMENT: Pt states she has been trying to wean off of her pain medication. States she woke up feeling good. Pt states right shoulder is still giving her a fit. Has been able to get into the car without having to sit first. Has not been able to do stairs in a reciprocal pattern yet.    Eval: T1DM and maintain a good AI1. 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 Has 4 dogs and husband is an avid exerciser. I have had multiple LE fx but have been trauma-related. Bilat 5th mets, Rt ankle fx 2 locations. Has had back surgery at L4-5 discectomy. Jan 27th  went on a family trip- skiing on new equipment- fell back, more on right side with bruise on Rt shoulder. I think the back of my skiis crossed and opened anteriorly and knees splayed out to strain groin muscles. Bindings did not release in fall. Hauled out by ski patrol and was in [facility]- ER xrays denied fx and sent me out on crutches.  After a week or 2 was not improving and tailbone felt like it was on fire. MRI showed bilat fractures, including sacrum. Did not miss a day at work and was unable to tolerate pain on walker. Developed tendonitis in right shoulder walking around the hospital on crutches. The 17th of Feb, Rt shoulder steroid injection. Discovered hip instability when I stepped into the tub-shower with right foot and felt something shift. When I rolled back to put my socks on, both hips shifted/popped/caught and had 10/10 pain and spent the day in bed. Dr B gave me a steroid shot in right hip on Monday and have had dramatic improvement. Was able to tolerate laying on her side for about 15 min last night. Walked short distances at home without RW.   PERTINENT HISTORY:  H/o ankle fx without surgery. T1DM Lt shoulder bursitis with PT in the past Injection to Rt shoulder recently for tendonitis   PAIN:  Are you having pain? Yes: NPRS scale: no pain at present, tightness in hamstrings Pain location: Rt hip, tailbone, Lateral Lt thigh sensation Pain description: I feel a sensation down lateral Lt thigh Aggravating factors: walking Relieving factors: reduced WB, injection  PRECAUTIONS:  None  RED FLAGS: None   WEIGHT BEARING RESTRICTIONS:  WBAT  FALLS:  Has patient fallen in last 6 months? Yes. 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  DIAGNOSTIC FINDINGS:  DG hip 08/14/23: Fractures of the bilateral  puboacetabular junction, inferior pubic rami and right sacrum a MRI are not well-defined by radiograph. The right inferior ramus fracture is potentially visualized, the other fractures are radiographically occult. There is no hip dislocation. No pubic symphyseal or sacroiliac diastasis.  PATIENT SURVEYS:  LEFS 18 09/25/23: 53/80   COGNITIVE STATUS: Within functional limits for tasks assessed   SENSATION: WFL  POSTURE:  Standing: Rt shoulder depression, decreased thoracic kyphosis & lumbar lordosis, notable pelvic rotation Supine: Lt leg functionally shorter, Rt anterior innom  HAND DOMINANCE:  Right  GAIT: Eval: RW, slow cadence, short stride    TREATMENT DATE:  Treatment                            4/8: Marquita Palms  lines following charge title = not provided on this treatment date.   Manual:   There-ex: Nustep L5 x 5 min LEs only Seated hamstring, figure 4 stretch, & piriformis stretch Squats #10 KB touch - cues for hip hinge There-Act: Single leg clock reach with wash rag Fwd step tap on 4" box 2x10 One foot on 4" box push down for glute set 2x10 Side step tap on 4" box 2x10 Self Care:  Nuro-Re-ed:  Gait Training:    Treatment                            3/27: Blank lines following charge title = not provided on this treatment date.   Manual:  TPDN No  There-ex: Seated hamstring & piriformis stretch Squats- cues for hip hinge Sumo squat There-Act: Discussed upcoming [place] trip LEFS Discussed work and rest breaks/ exercises during the day Self Care:  Nuro-Re-ed:  Investment banker, operational: Glut set in heel strike through stance phase   [facility] Adult PT Treatment:                                                DATE: 09/22/23 Therapeutic Exercise: Nu step L5 LE only 6" step up 2x10ea Single HR 2x10L, 2x6 R Standing hip adductor stretching  Standing marches 2x10 Partial squats at rail 2x5 Supine active hamstring stretch x10 BIL Bridge 2x10 Butterfly  stretch  Therapeutic Activity: STS x10 cues for symmetrical WB 6 inch step up BIL w/ UE support CGA x6 BIL Ambulation in clinic with cues for appropriate weight shifting and posture Continued education re: pacing of activities, symptom behavior as pertains to functional tolerance   [facility] Adult PT Treatment:                                                DATE: 09/19/23 Therapeutic Exercise: Nu step L3 LE only Supine active hamstring stretch x10 BIL Butterfly stretch 2x2min  HEP discussion, rationale for interventions  Therapeutic Activity: STS x10 cues for symmetrical WB 6 inch step up BIL w/ UE support CGA x6 BIL Ambulation in clinic with cues for appropriate weight shifting and posture Continued education re: pacing of activities, symptom behavior as pertains to functional tolerance     PATIENT EDUCATION:  Education details: rationale for interventions, HEP  Person educated: Patient Education method: Explanation, Demonstration, Tactile cues, Verbal cues Education comprehension: verbalized understanding, returned demonstration, verbal cues required, tactile cues required, and needs further education     HOME EXERCISE PROGRAM: [id number] Hesch self correction for Rt anterior innominate rotation   ASSESSMENT:  CLINICAL IMPRESSION: Treatment focused on continued R LE strengthening and working on improving R LE stability, balance and weight shift. Fatigues with clock reach.   Eval: Patient is a 58 y.o. F who was seen today for physical therapy evaluation and treatment for Rt hip pain following an incident on skiis that resulted in strained ER through bilateral LEs. Multiple pelvic fractures and strain through adductors, history of lumbar spine surgical intervention all contributing to pain. Pt does have known tearing in hip labrum but is feeling much better since the injection and would like to avoid surgery if possible.  Pt was very active prior to the accident and would like  to return to PLOF. Pt also notes Rt shoulder pain onset since the fall- notable postural changes through thoracic spine creating biomechanical chain input from pelvis to shoulder.     REHAB POTENTIAL: Good  CLINICAL DECISION MAKING: Stable/uncomplicated  EVALUATION COMPLEXITY: Low   GOALS: Goals reviewed with patient? Yes  SHORT TERM GOALS: Target date: 3/22  Able to ambulate household distances, without AD, without antalgic pattern Baseline: Goal status: IN PROGRESS (weaning off FWW to single crutch 3/21)  2.  Demo proper core control with exercises Baseline:  Goal status: IN PROGRESS 3/21    LONG TERM GOALS: Target date: POC date  Hip strength 90% of left side Baseline: MMT to be performed as appropriate Goal status: INITIAL  2.  Progressing into plyometric motions with minimal to no pain Baseline:  Goal status: INITIAL  3.  Return to work with understanding of exercises & rest breaks to reduce discomfort Baseline:  Goal status: IN PROGRESS  4.  Navigate uneven surfaces with minimal to no pain Baseline:  Goal status: INITIAL     PLAN:  PT FREQUENCY: 1-2x/week  PT DURATION: 12 weeks  PLANNED INTERVENTIONS: 97164- PT Re-evaluation, 97110-Therapeutic exercises, 97530- Therapeutic activity, 97112- Neuromuscular re-education, 97535- Self Care, [id number]- Manual therapy, [phone]- Gait training, [phone]- Aquatic Therapy, [phone]- Electrical stimulation (unattended), Patient/Family education, Balance training, Stair training, Taping, Dry Needling, Joint mobilization, Spinal mobilization, Cryotherapy, and Moist heat.  PLAN FOR NEXT SESSION: consider aquatics. Pt also interested in interventions looking at hamstring tightness, massage therapy.   Karessa Onorato April Ma L Ardyn Forge, PT, DPT 09/30/23 8:49 AM

## 2023-10-01 ENCOUNTER — Other Ambulatory Visit: Payer: Self-pay

## 2023-10-01 ENCOUNTER — Other Ambulatory Visit (HOSPITAL_COMMUNITY): Payer: Self-pay

## 2023-10-01 MED ORDER — DEXCOM G6 TRANSMITTER MISC
3 refills | Status: DC
  Filled 2023-10-01: qty 1, 30d supply, fill #0
  Filled 2024-01-22: qty 1, 30d supply, fill #1
  Filled 2024-04-19: qty 1, 30d supply, fill #2

## 2023-10-02 ENCOUNTER — Ambulatory Visit (HOSPITAL_BASED_OUTPATIENT_CLINIC_OR_DEPARTMENT_OTHER): Payer: Commercial Managed Care - PPO | Admitting: Physical Therapy

## 2023-10-02 ENCOUNTER — Encounter (HOSPITAL_BASED_OUTPATIENT_CLINIC_OR_DEPARTMENT_OTHER): Payer: Self-pay | Admitting: Physical Therapy

## 2023-10-02 ENCOUNTER — Other Ambulatory Visit (HOSPITAL_COMMUNITY): Payer: Self-pay

## 2023-10-02 DIAGNOSIS — R262 Difficulty in walking, not elsewhere classified: Secondary | ICD-10-CM

## 2023-10-02 DIAGNOSIS — M25551 Pain in right hip: Secondary | ICD-10-CM | POA: Diagnosis not present

## 2023-10-02 NOTE — Therapy (Signed)
 OUTPATIENT PHYSICAL THERAPY TREATMENT   Patient Name: Maria Evans MRN: [id number] DOB:1965/12/14, 58 y.o., female Today's Date: 10/02/2023  END OF SESSION:  PT End of Session - 10/02/23 1558     Visit Number 11    Number of Visits 25    Date for PT Re-Evaluation 11/15/23    Authorization Type [facility] Aet[employer]    PT Start Time 1558    PT Stop Time 1637    PT Time Calculation (min) 39 min    Activity Tolerance Patient tolerated treatment well    Behavior During Therapy WFL for tasks assessed/performed               Past Medical History:  Diagnosis Date   Anxiety    Anxiety with depression    Controlled.   Common migraine without aura    Diabetes mellitus type I, controlled (HCC)    Treated with 4 times daily insulin based on blood sugar monitoring   Hip sprain    & contusion   Hyperlipidemia due to type 1 diabetes mellitus (HCC)    Currently on atorvastatin.   Hypothyroid    Lipoma    MVP (mitral valve prolapse) 2005   Renally diagnosed back in 2005 (during her pregnancy)_, most recently evaluated in 2012.  [facility] - Cardiology, Dr. Emily Filbert); 11/2010 TTE: Moderate MR, posteriorly directed (recommend TEE) => TEE (01/2011) - mild MVP (anterior leaflet) with Mild-Mod MR.;    Past Surgical History:  Procedure Laterality Date   LAPAROSCOPIC LYSIS OF ADHESIONS     LIPOMA EXCISION Left    LUMBAR MICRODISCECTOMY     TRANSESOPHAGEAL ECHOCARDIOGRAM  02/13/2011   [facility] -> Dr. Emily Filbert [facility]-Cardiology): EF 55 to 60%.  Borderline LA dilation.  Prolapse of A2 scallop of mitral valve-mild MVP with mild to moderate MR.  (2 eccentric jets, posteriorly directed, c/w anterior leaflet pathology))   TRANSTHORACIC ECHOCARDIOGRAM  12/11/2010   [facility] -> Dr. Emily Filbert [facility] Phys[facility]-Cardiology): EF 35 to 6%.  Mild LA dilation.  Moderate MR-eccentric/possibly directed jet consistent with antilipid  pathology.  Mild TR, LP HTN, mild PR.  Recommend TEE.   TREADMILL STRESS ECHOCARDIOGRAM     [facility] -> Dr. Emily Filbert [facility] Physicians-Cardiology): Exercised 10 minutes (11 minutes), achieved Max Heart Rate 162 = 92% Max Predicted Heart Rate, clinically and electrocardiographically negative.  Nonspecific ST and T wave changes.  Prestress echo showed EF 55 to 60% with normal wall motion.  Post-Stress: EF 65 to 70% with nl augmentation of all walls.=> NORMAL STRESS [facility]   Patient Active Problem List   Diagnosis Date Noted   Osteoporosis 09/22/2023   Migraine headache 09/22/2023   Hypothyroidism 11/09/2021   Type 1 diabetes mellitus with hyperglycemia (HCC) 08/22/2020   History of mitral valve prolapse in adulthood 08/23/2010     REFERRING PROVIDER:  Huel Cote, MD     REFERRING DIAG: M25.351 (ICD-10-CM) - Hip instability, right   Rationale for Evaluation and Treatment: Rehabilitation  THERAPY DIAG:  Pain in right hip  Difficulty in walking, not elsewhere classified  ONSET DATE: 07/21/23   SUBJECTIVE:  SUBJECTIVE STATEMENT: I can almost put my jeans on standing up and I have gotten into the car with the leg first twice. I went to [place] and helped my son so I think I overdid it.    Eval: T1DM and maintain a good AI1. 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 I like to go sailing and skiing, enjoys trail walking around [facility]. Has 4 dogs and husband is an avid exerciser. I have had multiple LE fx but have been trauma-related. Bilat 5th mets, Rt ankle fx 2 locations. Has had back surgery at L4-5 discectomy. Jan 27th went on a family trip- skiing on new equipment- fell back, more on right side with bruise on Rt shoulder. I think the back of my skiis crossed and opened anteriorly and  knees splayed out to strain groin muscles. Bindings did not release in fall. Hauled out by ski patrol and was in [facility]- ER xrays denied fx and sent me out on crutches.  After a week or 2 was not improving and tailbone felt like it was on fire. MRI showed bilat fractures, including sacrum. Did not miss a day at work and was unable to tolerate pain on walker. Developed tendonitis in right shoulder walking around the hospital on crutches. The 17th of Feb, Rt shoulder steroid injection. Discovered hip instability when I stepped into the tub-shower with right foot and felt something shift. When I rolled back to put my socks on, both hips shifted/popped/caught and had 10/10 pain and spent the day in bed. Dr B gave me a steroid shot in right hip on Monday and have had dramatic improvement. Was able to tolerate laying on her side for about 15 min last night. Walked short distances at home without RW.   PERTINENT HISTORY:  H/o ankle fx without surgery. T1DM Lt shoulder bursitis with PT in the past Injection to Rt shoulder recently for tendonitis   PAIN:  Are you having pain? Yes: NPRS scale: no pain at present, tightness in hamstrings Pain location: Rt hip, tailbone, Lateral Lt thigh sensation Pain description: I feel a sensation down lateral Lt thigh Aggravating factors: walking Relieving factors: reduced WB, injection  PRECAUTIONS:  None  RED FLAGS: None   WEIGHT BEARING RESTRICTIONS:  WBAT  FALLS:  Has patient fallen in last 6 months? Yes. 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  DIAGNOSTIC FINDINGS:  DG hip 08/14/23: Fractures of the bilateral puboacetabular junction, inferior pubic rami and right sacrum a MRI are not well-defined by radiograph. The right inferior ramus fracture is potentially visualized, the  other fractures are radiographically occult. There is no hip dislocation. No pubic symphyseal or sacroiliac diastasis.  PATIENT SURVEYS:  LEFS 18 09/25/23: 53/80   COGNITIVE STATUS: Within functional limits for tasks assessed   SENSATION: WFL  POSTURE:  Standing: Rt shoulder depression, decreased thoracic kyphosis & lumbar lordosis, notable pelvic rotation Supine: Lt leg functionally shorter, Rt anterior innom  HAND DOMINANCE:  Right  GAIT: Eval: RW, slow cadence, short stride    TREATMENT DATE:  Treatment                            4/10: Blank lines following charge title = not provided on this treatment date.   Manual:  TPDN No Roller to  Rt hip and LE STM Rt lumbar paraspinals There-ex: Bar pull off chair Bent over hip ext, ball behind knee There-Act:  Self Care:  Nuro-Re-ed:  Gait Training: Increasing trunk rotation   Treatment                            4/8: Blank lines following charge title = not provided on this treatment date.   Manual:   There-ex: Nustep L5 x 5 min LEs only Seated hamstring, figure 4 stretch, & piriformis stretch Squats #10 KB touch - cues for hip hinge There-Act: Single leg clock reach with wash rag Fwd step tap on 4" box 2x10 One foot on 4" box push down for glute set 2x10 Side step tap on 4" box 2x10 Self Care:  Nuro-Re-ed:  Gait Training:    PATIENT EDUCATION:  Education details: rationale for interventions, HEP  Person educated: Patient Education method: Programmer, multimedia, Demonstration, Tactile cues, Verbal cues Education comprehension: verbalized understanding, returned demonstration, verbal cues required, tactile cues required, and needs further education     HOME EXERCISE PROGRAM: [id number] Hesch self correction for Rt anterior innominate rotation   ASSESSMENT:  CLINICAL IMPRESSION: Able to ambulate from the clinic without antalgic pattern upon improvement of trunk rotation.   Eval: Patient is a 58 y.o. F  who was seen today for physical therapy evaluation and treatment for Rt hip pain following an incident on skiis that resulted in strained ER through bilateral LEs. Multiple pelvic fractures and strain through adductors, history of lumbar spine surgical intervention all contributing to pain. Pt does have known tearing in hip labrum but is feeling much better since the injection and would like to avoid surgery if possible.  Pt was very active prior to the accident and would like to return to PLOF. Pt also notes Rt shoulder pain onset since the fall- notable postural changes through thoracic spine creating biomechanical chain input from pelvis to shoulder.     REHAB POTENTIAL: Good  CLINICAL DECISION MAKING: Stable/uncomplicated  EVALUATION COMPLEXITY: Low   GOALS: Goals reviewed with patient? Yes  SHORT TERM GOALS: Target date: 3/22  Able to ambulate household distances, without AD, without antalgic pattern Baseline: Goal status: IN PROGRESS (weaning off FWW to single crutch 3/21)  2.  Demo proper core control with exercises Baseline:  Goal status: IN PROGRESS 3/21    LONG TERM GOALS: Target date: POC date  Hip strength 90% of left side Baseline: MMT to be performed as appropriate Goal status: INITIAL  2.  Progressing into plyometric motions with minimal to no pain Baseline:  Goal status: INITIAL  3.  Return to work with understanding of exercises & rest breaks to reduce discomfort Baseline:  Goal status: IN PROGRESS  4.  Navigate uneven surfaces with minimal to no pain Baseline:  Goal status: INITIAL     PLAN:  PT FREQUENCY: 1-2x/week  PT DURATION: 12 weeks  PLANNED INTERVENTIONS: 97164- PT Re-evaluation, 97110-Therapeutic exercises, 97530- Therapeutic activity, 97112- Neuromuscular re-education, 97535- Self Care, 54098- Manual therapy, [phone]- Gait training, [phone]- Aquatic Therapy, [phone]- Electrical stimulation (unattended), Patient/Family education, Balance  training, Stair training, Taping, Dry Needling, Joint mobilization, Spinal mobilization, Cryotherapy, and Moist heat.  PLAN FOR NEXT SESSION: consider aquatics. Pt also interested in interventions looking at hamstring tightness, massage therapy.   Army Fossa, PT, DPT 10/02/23 4:39 PM

## 2023-10-06 ENCOUNTER — Encounter (HOSPITAL_BASED_OUTPATIENT_CLINIC_OR_DEPARTMENT_OTHER): Payer: Self-pay | Admitting: Orthopaedic Surgery

## 2023-10-06 DIAGNOSIS — M25511 Pain in right shoulder: Secondary | ICD-10-CM

## 2023-10-07 ENCOUNTER — Ambulatory Visit (HOSPITAL_BASED_OUTPATIENT_CLINIC_OR_DEPARTMENT_OTHER): Payer: Commercial Managed Care - PPO | Admitting: Physical Therapy

## 2023-10-07 DIAGNOSIS — R262 Difficulty in walking, not elsewhere classified: Secondary | ICD-10-CM

## 2023-10-07 DIAGNOSIS — R293 Abnormal posture: Secondary | ICD-10-CM

## 2023-10-07 DIAGNOSIS — M25551 Pain in right hip: Secondary | ICD-10-CM

## 2023-10-07 DIAGNOSIS — M25511 Pain in right shoulder: Secondary | ICD-10-CM

## 2023-10-07 DIAGNOSIS — M6281 Muscle weakness (generalized): Secondary | ICD-10-CM

## 2023-10-07 NOTE — Therapy (Signed)
 OUTPATIENT PHYSICAL THERAPY TREATMENT AND SHOULDER EVALUATION   Patient Name: Maria Evans MRN: [id number] DOB:26-Jan-1966, 58 y.o., female Today's Date: 10/07/2023  END OF SESSION:  PT End of Session - 10/07/23 1519     Visit Number 12    Number of Visits 25    Date for PT Re-Evaluation 11/15/23    Authorization Type [facility] [employer]    PT Start Time 1519    PT Stop Time 1600    PT Time Calculation (min) 41 min    Activity Tolerance Patient tolerated treatment well    Behavior During Therapy WFL for tasks assessed/performed               Past Medical History:  Diagnosis Date   Anxiety    Anxiety with depression    Controlled.   Common migraine without aura    Diabetes mellitus type I, controlled (HCC)    Treated with 4 times daily insulin based on blood sugar monitoring   Hip sprain    & contusion   Hyperlipidemia due to type 1 diabetes mellitus (HCC)    Currently on atorvastatin.   Hypothyroid    Lipoma    MVP (mitral valve prolapse) 2005   Renally diagnosed back in 2005 (during her pregnancy)_, most recently evaluated in 2012.  [facility] - Cardiology, Dr. Joanne Muckle); 11/2010 TTE: Moderate MR, posteriorly directed (recommend TEE) => TEE (01/2011) - mild MVP (anterior leaflet) with Mild-Mod MR.;    Past Surgical History:  Procedure Laterality Date   LAPAROSCOPIC LYSIS OF ADHESIONS     LIPOMA EXCISION Left    LUMBAR MICRODISCECTOMY     TRANSESOPHAGEAL ECHOCARDIOGRAM  02/13/2011   [facility] -> Dr. Joanne Muckle [facility]Cardiology): EF 55 to 60%.  Borderline LA dilation.  Prolapse of A2 scallop of mitral valve-mild MVP with mild to moderate MR.  (2 eccentric jets, posteriorly directed, c/w anterior leaflet pathology))   TRANSTHORACIC ECHOCARDIOGRAM  12/11/2010   [facility] -> Dr. Joanne Muckle [facility]Cardiology): EF 35 to 6%.  Mild LA dilation.  Moderate MR-eccentric/possibly directed jet  consistent with antilipid pathology.  Mild TR, LP HTN, mild PR.  Recommend TEE.   TREADMILL STRESS ECHOCARDIOGRAM     [facility] -> Dr. Joanne Muckle [facility]-Cardiology): Exercised 10 minutes (11 minutes), achieved Max Heart Rate 162 = 92% Max Predicted Heart Rate, clinically and electrocardiographically negative.  Nonspecific ST and T wave changes.  Prestress echo showed EF 55 to 60% with normal wall motion.  Post-Stress: EF 65 to 70% with nl augmentation of all walls.=> NORMAL STRESS [employer]   Patient Active Problem List   Diagnosis Date Noted   Osteoporosis 09/22/2023   Migraine headache 09/22/2023   Hypothyroidism 11/09/2021   Type 1 diabetes mellitus with hyperglycemia (HCC) 08/22/2020   History of mitral valve prolapse in adulthood 08/23/2010     REFERRING PROVIDER:  Wilhelmenia Harada, MD     REFERRING DIAG: M25.351 (ICD-10-CM) - Hip instability, right   Rationale for Evaluation and Treatment: Rehabilitation  THERAPY DIAG:  Pain in right hip  Difficulty in walking, not elsewhere classified  Abnormal posture  Muscle weakness (generalized)  Acute pain of right shoulder  ONSET DATE: 07/21/23   SUBJECTIVE:  SUBJECTIVE STATEMENT: In regards to pt's R LE: Pt reports she can now stand and put on her pants and underwear. Able to walk from employee parking lot into work. Was able to go up/down stairs. No pain but could tell her leg was tired.   New referral for her shoulder pain is in today. In regards to her shoulder, she feels she has some underlying R shoulder arthritis with history of bursitis in her L shoulder. Thinks she may have had a touch of bursitis on her R shoulder even prior to her fall. During the fall, she did land on her R shoulder and overused it with using the  crutches which greatly irritated it. Did get steroid shot in shoulder 08/11/23. Still gets issues with putting on her shirt -- gets a shooting pain. Most bothered with driving and her arm will start hurting. Pt is right hand dominant so using it at work also makes it fatigued and sore. Feels that the pain is more generalized from the medial arm which can radiate down the forearms.    Eval: T1DM and maintain a good AI1. 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 Has 4 dogs and husband is an avid exerciser. I have had multiple LE fx but have been trauma-related. Bilat 5th mets, Rt ankle fx 2 locations. Has had back surgery at L4-5 discectomy. Jan 27th went on a family trip- skiing on new equipment- fell back, more on right side with bruise on Rt shoulder. I think the back of my skiis crossed and opened anteriorly and knees splayed out to strain groin muscles. Bindings did not release in fall. Hauled out by ski patrol and was [facility]- ER xrays denied fx and sent me out on crutches.  After a week or 2 was not improving and tailbone felt like it was on fire. MRI showed bilat fractures, including sacrum. Did not miss a day at work and was unable to tolerate pain on walker. Developed tendonitis in right shoulder walking around the hospital on crutches. The 17th of Feb, Rt shoulder steroid injection. Discovered hip instability when I stepped into the tub-shower with right foot and felt something shift. When I rolled back to put my socks on, both hips shifted/popped/caught and had 10/10 pain and spent the day in bed. Dr B gave me a steroid shot in right hip on Monday and have had dramatic improvement. Was able to tolerate laying on her side for about 15 min last night. Walked short distances at home without RW.   PERTINENT HISTORY:  H/o ankle fx without surgery. T1DM Lt shoulder bursitis with PT in the past Injection to Rt shoulder recently for tendonitis    PAIN:  Are you having pain? Yes: NPRS scale: no pain at present, tightness in hamstrings Pain location: Rt hip, tailbone, Lateral Lt thigh sensation Pain description: I feel a sensation down lateral Lt thigh Aggravating factors: walking Relieving factors: reduced WB, injection  PRECAUTIONS:  None  RED FLAGS: None   WEIGHT BEARING RESTRICTIONS:  WBAT  FALLS:  Has patient fallen in last 6 months? Yes. 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  DIAGNOSTIC FINDINGS:  DG hip 08/14/23: Fractures of the bilateral puboacetabular junction, inferior pubic rami and right sacrum a MRI are  not well-defined by radiograph. The right inferior ramus fracture is potentially visualized, the other fractures are radiographically occult. There is no hip dislocation. No pubic symphyseal or sacroiliac diastasis.  PATIENT SURVEYS:  LEFS 18 09/25/23: 53/80   COGNITIVE STATUS: Within functional limits for tasks assessed   SENSATION: WFL  POSTURE:  Standing: Rt shoulder depression, decreased thoracic kyphosis & lumbar lordosis, notable pelvic rotation Supine: Lt leg functionally shorter, Rt anterior innom  HAND DOMINANCE:  Right  GAIT: Eval: RW, slow cadence, short stride  UPPER EXTREMITY ROM:   Active ROM Right 10/07/23 Left 10/07/23  Shoulder flexion 165 158  Shoulder extension    Shoulder abduction 160 Wants to help bringing her arm down 160  Shoulder adduction    Shoulder extension    Shoulder internal rotation T7 with pain T4  Shoulder external rotation 45 with pain 54  Elbow flexion    Elbow extension    Wrist flexion    Wrist extension    Wrist ulnar deviation    Wrist radial deviation    Wrist pronation    Wrist supination     (Blank rows = not tested)  UPPER EXTREMITY MMT:  MMT  Right 10/07/23 Left 10/07/23  Shoulder flexion 5 5  Shoulder extension    Shoulder abduction 5 with pain 5  Shoulder adduction    Shoulder extension    Shoulder internal rotation 5 5  Shoulder external rotation 5 5  Middle trapezius in prone 3+ with pain   Lower trapezius in prone 3- with pain   Elbow flexion    Elbow extension    Wrist flexion    Wrist extension    Wrist ulnar deviation    Wrist radial deviation    Wrist pronation    Wrist supination    Grip strength     (Blank rows = not tested)  SPECIAL TESTS:  Roo's test (+) within 10 sec  Painful arc (-) but painful on eccentric control with bringing arm down  Rotator cuff testing otherwise (-)   ULTT (+) median nerve especially, slightly with radial nerve, (-) ulnar nerve   TREATMENT DATE:  Treatment                            4/15: Blank lines following charge title = not provided on this treatment date.   Manual:   There-ex: Doorway pec stretch low, middle, high x 30" each There-Act:  Self Care: Positions to avoid, desk ergonomics, sleep positioning to decrease shoulder irritation Neuro-Re-ed: Attempted nerve glide but too irritating for pt Scap squeeze 2x10 Shoulder ER 2x10 Gait Training:    Treatment                            4/10: Blank lines following charge title = not provided on this treatment date.   Manual:  TPDN No Roller to Rt hip and LE STM Rt lumbar paraspinals There-ex: Bar pull off chair Bent over hip ext, ball behind knee There-Act:  Self Care:  Nuro-Re-ed:  Gait Training: Increasing trunk rotation   Treatment                            4/8: Blank lines following charge title = not provided on this treatment date.   Manual:   There-ex: Nustep L5 x 5 min LEs only Seated hamstring, figure 4 stretch, &  piriformis stretch Squats #10 KB touch - cues for hip hinge There-Act: Single leg clock reach with wash rag Fwd step tap on 4" box 2x10 One foot on 4" box push down  for glute set 2x10 Side step tap on 4" box 2x10 Self Care:  Nuro-Re-ed:  Gait Training:    PATIENT EDUCATION:  Education details: rationale for interventions, HEP  Person educated: Patient Education method: Programmer, multimedia, Demonstration, Tactile cues, Verbal cues Education comprehension: verbalized understanding, returned demonstration, verbal cues required, tactile cues required, and needs further education     HOME EXERCISE PROGRAM: [id number] Hesch self correction for Rt anterior innominate rotation   ASSESSMENT:  CLINICAL IMPRESSION: Session focused on evaluating pt's right shoulder pain. On assessment, pt found to have very anteriorly tilted R scapula with R>L forward shoulder. ULTT was (+) on the right especially for median nerve and slightly with radial nerve. Pt with shortened pec muscles and weak periscapular muscles but otherwise strong RTC. Pt's pain reproducible with Roo's testing. Pt's pain is not pin point and generalized that can radiate down to her forearm. S/S appear consistent with impinged and irritated brachial plexus from landing on her R shoulder s/p fall and then with prolonged crutch use. Pt will benefit from PT to improve on her scapular strength to decrease brachial plexus impingement/irritation with her work, home, and driving tasks.   Eval: Patient is a 58 y.o. F who was seen today for physical therapy evaluation and treatment for Rt hip pain following an incident on skiis that resulted in strained ER through bilateral LEs. Multiple pelvic fractures and strain through adductors, history of lumbar spine surgical intervention all contributing to pain. Pt does have known tearing in hip labrum but is feeling much better since the injection and would like to avoid surgery if possible.  Pt was very active prior to the accident and would like to return to PLOF. Pt also notes Rt shoulder pain onset since the fall- notable postural changes through thoracic spine creating  biomechanical chain input from pelvis to shoulder.     REHAB POTENTIAL: Good  CLINICAL DECISION MAKING: Stable/uncomplicated  EVALUATION COMPLEXITY: Low   GOALS: Goals reviewed with patient? Yes  SHORT TERM GOALS: Target date: 3/22  Able to ambulate household distances, without AD, without antalgic pattern Baseline: Goal status: IN PROGRESS (weaning off FWW to single crutch 3/21)  2.  Demo proper core control with exercises Baseline:  Goal status: IN PROGRESS 3/21    LONG TERM GOALS: Target date: POC date  Hip strength 90% of left side Baseline: MMT to be performed as appropriate Goal status: INITIAL  2.  Progressing into plyometric motions with minimal to no pain Baseline:  Goal status: INITIAL  3.  Return to work with understanding of exercises & rest breaks to reduce discomfort Baseline:  Goal status: IN PROGRESS  4.  Navigate uneven surfaces with minimal to no pain Baseline:  Goal status: INITIAL  5.  Pt will be able to move R UE in all directions without pain for dressing and driving tasks Baseline:  Goal status: INITIAL  6.  Pt will demo at least 4/5 periscapular strength to demo increased shoulder stability and decreased nerve impingement Baseline:  Goal status: INITIAL     PLAN:  PT FREQUENCY: 1-2x/week  PT DURATION: 12 weeks  PLANNED INTERVENTIONS: [id number]- PT Re-evaluation, 97110-Therapeutic exercises, 97530- Therapeutic activity, 97112- Neuromuscular re-education, 97535- Self Care, 54098- Manual therapy, [id number]- Gait training, [phone]- Aquatic Therapy, [phone]- Electrical stimulation (unattended), Patient/Family education, Balance  training, Stair training, Taping, Dry Needling, Joint mobilization, Spinal mobilization, Cryotherapy, and Moist heat.  PLAN FOR NEXT SESSION: consider aquatics. Pt also interested in interventions looking at hamstring tightness, massage therapy. Strengthen rhomboids, mid traps, low traps; stretch pec minor/major; nerve  glides/stretches  Sophiagrace Benbrook April Ma L Makenize Messman, PT, DPT 10/07/23 3:21 PM

## 2023-10-07 NOTE — Telephone Encounter (Signed)
 FYI new PT order in chart

## 2023-10-09 ENCOUNTER — Ambulatory Visit (HOSPITAL_BASED_OUTPATIENT_CLINIC_OR_DEPARTMENT_OTHER): Payer: Commercial Managed Care - PPO | Admitting: Physical Therapy

## 2023-10-09 ENCOUNTER — Encounter (HOSPITAL_BASED_OUTPATIENT_CLINIC_OR_DEPARTMENT_OTHER): Payer: Self-pay | Admitting: Physical Therapy

## 2023-10-09 DIAGNOSIS — M6281 Muscle weakness (generalized): Secondary | ICD-10-CM

## 2023-10-09 DIAGNOSIS — R293 Abnormal posture: Secondary | ICD-10-CM

## 2023-10-09 DIAGNOSIS — R262 Difficulty in walking, not elsewhere classified: Secondary | ICD-10-CM

## 2023-10-09 DIAGNOSIS — M25551 Pain in right hip: Secondary | ICD-10-CM

## 2023-10-09 DIAGNOSIS — M25511 Pain in right shoulder: Secondary | ICD-10-CM

## 2023-10-09 NOTE — Therapy (Signed)
 OUTPATIENT PHYSICAL THERAPY TREATMENT AND SHOULDER EVALUATION   Patient Name: Maria Evans MRN: [id number] DOB:June 18, 1966, 58 y.o., female Today's Date: 10/09/2023  END OF SESSION:  PT End of Session - 10/09/23 1601     Visit Number 13    Number of Visits 25    Date for PT Re-Evaluation 11/15/23    Authorization Type [facility] [employer]    PT Start Time 1600    PT Stop Time 1645    PT Time Calculation (min) 45 min    Activity Tolerance Patient tolerated treatment well    Behavior During Therapy WFL for tasks assessed/performed               Past Medical History:  Diagnosis Date   Anxiety    Anxiety with depression    Controlled.   Common migraine without aura    Diabetes mellitus type I, controlled (HCC)    Treated with 4 times daily insulin based on blood sugar monitoring   Hip sprain    & contusion   Hyperlipidemia due to type 1 diabetes mellitus (HCC)    Currently on atorvastatin.   Hypothyroid    Lipoma    MVP (mitral valve prolapse) 2005   Renally diagnosed back in 2005 (during her pregnancy)_, most recently evaluated in 2012.  [facility], Dr. Joanne Muckle); 11/2010 TTE: Moderate MR, posteriorly directed (recommend TEE) => TEE (01/2011) - mild MVP (anterior leaflet) with Mild-Mod MR.;    Past Surgical History:  Procedure Laterality Date   LAPAROSCOPIC LYSIS OF ADHESIONS     LIPOMA EXCISION Left    LUMBAR MICRODISCECTOMY     TRANSESOPHAGEAL ECHOCARDIOGRAM  02/13/2011   [facility] -> Dr. Joanne Muckle [facility]): EF 55 to 60%.  Borderline LA dilation.  Prolapse of A2 scallop of mitral valve-mild MVP with mild to moderate MR.  (2 eccentric jets, posteriorly directed, c/w anterior leaflet pathology))   TRANSTHORACIC ECHOCARDIOGRAM  12/11/2010   [facility] -> Dr. Joanne Muckle [facility]): EF 35 to 6%.  Mild LA dilation.  Moderate MR-eccentric/possibly directed jet  consistent with antilipid pathology.  Mild TR, LP HTN, mild PR.  Recommend TEE.   TREADMILL STRESS ECHOCARDIOGRAM     [facility] -> Dr. Joanne Muckle [facility]Card[facility]): Exercised 10 minutes (11 minutes), achieved Max Heart Rate 162 = 92% Max Predicted Heart Rate, clinically and electrocardiographically negative.  Nonspecific ST and T wave changes.  Prestress echo showed EF 55 to 60% with normal wall motion.  Post-Stress: EF 65 to 70% with nl augmentation of all walls.=> NORMAL STRESS [facility]   Patient Active Problem List   Diagnosis Date Noted   Osteoporosis 09/22/2023   Migraine headache 09/22/2023   Hypothyroidism 11/09/2021   Type 1 diabetes mellitus with hyperglycemia (HCC) 08/22/2020   History of mitral valve prolapse in adulthood 08/23/2010     REFERRING PROVIDER:  Wilhelmenia Harada, MD     REFERRING DIAG: M25.351 (ICD-10-CM) - Hip instability, right   Rationale for Evaluation and Treatment: Rehabilitation  THERAPY DIAG:  Pain in right hip  Difficulty in walking, not elsewhere classified  Abnormal posture  Muscle weakness (generalized)  Acute pain of right shoulder  ONSET DATE: 07/21/23   SUBJECTIVE:  SUBJECTIVE STATEMENT: My right shoulder has started hurting. Doing stretches before bed.   ERO: New referral for her shoulder pain is in today. In regards to her shoulder, she feels she has some underlying R shoulder arthritis with history of bursitis in her L shoulder. Thinks she may have had a touch of bursitis on her R shoulder even prior to her fall. During the fall, she did land on her R shoulder and overused it with using the crutches which greatly irritated it. Did get steroid shot in shoulder 08/11/23. Still gets issues with putting on her shirt -- gets a shooting  pain. Most bothered with driving and her arm will start hurting. Pt is right hand dominant so using it at work also makes it fatigued and sore. Feels that the pain is more generalized from the medial arm which can radiate down the forearms.    Eval: T1DM and maintain a good AI1. 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 I like to go sailing and skiing, enjoys trail walking around [facility]. Has 4 dogs and husband is an avid exerciser. I have had multiple LE fx but have been trauma-related. Bilat 5th mets, Rt ankle fx 2 locations. Has had back surgery at L4-5 discectomy. Jan 27th went on a family trip- skiing on new equipment- fell back, more on right side with bruise on Rt shoulder. I think the back of my skiis crossed and opened anteriorly and knees splayed out to strain groin muscles. Bindings did not release in fall. Hauled out by ski patrol and was in [facility]- ER xrays denied fx and sent me out on crutches.  After a week or 2 was not improving and tailbone felt like it was on fire. MRI showed bilat fractures, including sacrum. Did not miss a day at work and was unable to tolerate pain on walker. Developed tendonitis in right shoulder walking around the hospital on crutches. The 17th of Feb, Rt shoulder steroid injection. Discovered hip instability when I stepped into the tub-shower with right foot and felt something shift. When I rolled back to put my socks on, both hips shifted/popped/caught and had 10/10 pain and spent the day in bed. Dr B gave me a steroid shot in right hip on Monday and have had dramatic improvement. Was able to tolerate laying on her side for about 15 min last night. Walked short distances at home without RW.   PERTINENT HISTORY:  H/o ankle fx without surgery. T1DM Lt shoulder bursitis with PT in the past Injection to Rt shoulder recently for tendonitis   PAIN:  Are you having pain? Yes: NPRS scale: no pain at present, tightness in hamstrings Pain location: Rt hip, tailbone, Lateral Lt  thigh sensation Pain description: I feel a sensation down lateral Lt thigh Aggravating factors: walking Relieving factors: reduced WB, injection  PRECAUTIONS:  None  RED FLAGS: None   WEIGHT BEARING RESTRICTIONS:  WBAT  FALLS:  Has patient fallen in last 6 months? Yes. 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  DIAGNOSTIC FINDINGS:  DG hip 08/14/23: Fractures of the bilateral puboacetabular junction, inferior pubic rami and right sacrum a MRI are not well-defined by radiograph. The right inferior ramus fracture is potentially visualized, the other fractures are radiographically occult. There is no hip dislocation. No pubic symphyseal or sacroiliac diastasis.  PATIENT SURVEYS:  LEFS 18 09/25/23: 53/80   COGNITIVE STATUS: Within functional limits for tasks assessed   SENSATION: WFL  POSTURE:  Standing: Rt shoulder depression, decreased thoracic kyphosis & lumbar lordosis, notable pelvic rotation Supine: Lt leg functionally shorter, Rt anterior innom  HAND DOMINANCE:  Right  GAIT: Eval: RW, slow cadence, short stride  UPPER EXTREMITY ROM:   Active ROM Right 10/07/23 Left 10/07/23  Shoulder flexion 165 158  Shoulder extension    Shoulder abduction 160 Wants to help bringing her arm down 160  Shoulder adduction    Shoulder extension    Shoulder internal rotation T7 with pain T4  Shoulder external rotation 45 with pain 54  Elbow flexion    Elbow extension    Wrist flexion    Wrist extension    Wrist ulnar deviation    Wrist radial deviation    Wrist pronation    Wrist supination     (Blank rows = not tested)  UPPER EXTREMITY MMT:  MMT Right 10/07/23 Left 10/07/23  Shoulder flexion 5 5  Shoulder extension    Shoulder abduction 5 with pain 5  Shoulder adduction    Shoulder  extension    Shoulder internal rotation 5 5  Shoulder external rotation 5 5  Middle trapezius in prone 3+ with pain   Lower trapezius in prone 3- with pain   Elbow flexion    Elbow extension    Wrist flexion    Wrist extension    Wrist ulnar deviation    Wrist radial deviation    Wrist pronation    Wrist supination    Grip strength     (Blank rows = not tested)  SPECIAL TESTS:  Roo's test (+) within 10 sec  Painful arc (-) but painful on eccentric control with bringing arm down  Rotator cuff testing otherwise (-)   ULTT (+) median nerve especially, slightly with radial nerve, (-) ulnar nerve   TREATMENT DATE:  Treatment                            4/17: Blank lines following charge title = not provided on this treatment date.   Manual:  TPDN No STM pecs, rhomboids, subscap, upper trap There-ex: Supine hamstring stretch with strap- over each shoulder 5 breath holds Happy baby progression There-Act: Discussed work Information systems manager Care:  Nuro-Re-ed: Sidelying ER, abd, horiz abd- PT assist for scapular motion Gait Training:    Treatment                            4/15: Blank lines following charge title = not provided on this treatment date.   Manual:   There-ex: Doorway pec stretch low, middle, high x 30" each There-Act:  Self Care: Positions to avoid, desk ergonomics, sleep positioning to decrease shoulder irritation Neuro-Re-ed: Attempted nerve glide but too irritating for pt Scap squeeze 2x10 Shoulder ER 2x10 Gait Training:    Treatment                            4/10: Blank lines following charge title = not provided on this treatment date.   Manual:  TPDN No Roller to Rt hip and LE STM Rt lumbar paraspinals There-ex: Bar pull off chair Bent over hip ext, ball behind knee There-Act:  Self Care:  Nuro-Re-ed:  Gait Training: Increasing trunk rotation  PATIENT EDUCATION:  Education details: rationale for interventions, HEP  Person  educated: Patient Education method: Explanation, Demonstration, Tactile cues, Verbal cues Education comprehension: verbalized understanding, returned demonstration, verbal cues required, tactile cues required, and needs further education     HOME EXERCISE PROGRAM: [id number] Hesch self correction for Rt anterior innominate rotation   ASSESSMENT:  CLINICAL IMPRESSION: Proximal adductor discomfort addressed with stretching today. Able to release pec spasm to improve ability to retract scapula. Pt reported decreased pain with tactile cues for scapular glide.   Eval: Patient is a 58 y.o. F who was seen today for physical therapy evaluation and treatment for Rt hip pain following an incident on skiis that resulted in strained ER through bilateral LEs. Multiple pelvic fractures and strain through adductors, history of lumbar spine surgical intervention all contributing to pain. Pt does have known tearing in hip labrum but is feeling much better since the injection and would like to avoid surgery if possible.  Pt was very active prior to the accident and would like to return to PLOF. Pt also notes Rt shoulder pain onset since the fall- notable postural changes through thoracic spine creating biomechanical chain input from pelvis to shoulder.     REHAB POTENTIAL: Good  CLINICAL DECISION MAKING: Stable/uncomplicated  EVALUATION COMPLEXITY: Low   GOALS: Goals reviewed with patient? Yes  SHORT TERM GOALS: Target date: 3/22  Able to ambulate household distances, without AD, without antalgic pattern Baseline: Goal status: IN PROGRESS (weaning off FWW to single crutch 3/21)  2.  Demo proper core control with exercises Baseline:  Goal status: IN PROGRESS 3/21    LONG TERM GOALS: Target date: POC date  Hip strength 90% of left side Baseline: MMT to be performed as appropriate Goal status: INITIAL  2.  Progressing into plyometric motions with minimal to no pain Baseline:  Goal status:  INITIAL  3.  Return to work with understanding of exercises & rest breaks to reduce discomfort Baseline:  Goal status: IN PROGRESS  4.  Navigate uneven surfaces with minimal to no pain Baseline:  Goal status: INITIAL  5.  Pt will be able to move R UE in all directions without pain for dressing and driving tasks Baseline:  Goal status: INITIAL  6.  Pt will demo at least 4/5 periscapular strength to demo increased shoulder stability and decreased nerve impingement Baseline:  Goal status: INITIAL     PLAN:  PT FREQUENCY: 1-2x/week  PT DURATION: 12 weeks  PLANNED INTERVENTIONS: [id number]- PT Re-evaluation, 97110-Therapeutic exercises, 97530- Therapeutic activity, 97112- Neuromuscular re-education, 97535- Self Care, [id number]- Manual therapy, [phone]- Gait training, [phone]- Aquatic Therapy, [phone]- Electrical stimulation (unattended), Patient/Family education, Balance training, Stair training, Taping, Dry Needling, Joint mobilization, Spinal mobilization, Cryotherapy, and Moist heat.  PLAN FOR NEXT SESSION: consider aquatics. Pt also interested in interventions looking at hamstring tightness, massage therapy. Strengthen rhomboids, mid traps, low traps; stretch pec minor/major; nerve glides/stretches  Keven Pel, PT, DPT 10/09/23 6:23 PM

## 2023-10-13 ENCOUNTER — Ambulatory Visit: Admitting: Family Medicine

## 2023-10-14 ENCOUNTER — Encounter (HOSPITAL_BASED_OUTPATIENT_CLINIC_OR_DEPARTMENT_OTHER): Payer: Commercial Managed Care - PPO

## 2023-10-16 ENCOUNTER — Other Ambulatory Visit: Payer: Self-pay

## 2023-10-16 ENCOUNTER — Encounter (HOSPITAL_BASED_OUTPATIENT_CLINIC_OR_DEPARTMENT_OTHER): Payer: Commercial Managed Care - PPO | Admitting: Physical Therapy

## 2023-10-19 ENCOUNTER — Other Ambulatory Visit (HOSPITAL_COMMUNITY): Payer: Self-pay

## 2023-10-19 ENCOUNTER — Other Ambulatory Visit: Payer: Self-pay | Admitting: Internal Medicine

## 2023-10-20 ENCOUNTER — Other Ambulatory Visit: Payer: Self-pay

## 2023-10-20 ENCOUNTER — Other Ambulatory Visit (HOSPITAL_COMMUNITY): Payer: Self-pay

## 2023-10-21 ENCOUNTER — Encounter (HOSPITAL_BASED_OUTPATIENT_CLINIC_OR_DEPARTMENT_OTHER): Payer: Self-pay

## 2023-10-21 ENCOUNTER — Other Ambulatory Visit (HOSPITAL_COMMUNITY): Payer: Self-pay

## 2023-10-21 ENCOUNTER — Ambulatory Visit (HOSPITAL_BASED_OUTPATIENT_CLINIC_OR_DEPARTMENT_OTHER)

## 2023-10-21 ENCOUNTER — Encounter (HOSPITAL_BASED_OUTPATIENT_CLINIC_OR_DEPARTMENT_OTHER): Payer: Commercial Managed Care - PPO

## 2023-10-21 DIAGNOSIS — F4322 Adjustment disorder with anxiety: Secondary | ICD-10-CM | POA: Diagnosis not present

## 2023-10-21 DIAGNOSIS — M6281 Muscle weakness (generalized): Secondary | ICD-10-CM

## 2023-10-21 DIAGNOSIS — M25551 Pain in right hip: Secondary | ICD-10-CM | POA: Diagnosis not present

## 2023-10-21 DIAGNOSIS — M25511 Pain in right shoulder: Secondary | ICD-10-CM

## 2023-10-21 DIAGNOSIS — R262 Difficulty in walking, not elsewhere classified: Secondary | ICD-10-CM

## 2023-10-21 DIAGNOSIS — R293 Abnormal posture: Secondary | ICD-10-CM

## 2023-10-21 DIAGNOSIS — M25561 Pain in right knee: Secondary | ICD-10-CM

## 2023-10-21 MED ORDER — LISINOPRIL 2.5 MG PO TABS
2.5000 mg | ORAL_TABLET | Freq: Every day | ORAL | 0 refills | Status: DC
  Filled 2023-10-21: qty 90, 90d supply, fill #0

## 2023-10-21 MED ORDER — ATORVASTATIN CALCIUM 20 MG PO TABS
20.0000 mg | ORAL_TABLET | ORAL | 3 refills | Status: AC
  Filled 2023-10-21: qty 45, 90d supply, fill #0
  Filled 2024-01-22: qty 45, 90d supply, fill #1
  Filled 2024-04-19: qty 45, 90d supply, fill #2

## 2023-10-21 NOTE — Therapy (Signed)
 OUTPATIENT PHYSICAL THERAPY TREATMENT  Patient Name: Maria Evans MRN: [id number] DOB:08-Dec-1965, 58 y.o., female Today's Date: 10/21/2023  END OF SESSION:  PT End of Session - 10/21/23 0940     Visit Number 14    Number of Visits 25    Date for PT Re-Evaluation 11/15/23    Authorization Type [facility] [employer]    PT Start Time [phone]    PT Stop Time 1015    PT Time Calculation (min) 42 min    Activity Tolerance Patient tolerated treatment well    Behavior During Therapy [facility] for tasks assessed/performed                Past Medical History:  Diagnosis Date   Anxiety    Anxiety with depression    Controlled.   Common migraine without aura    Diabetes mellitus type I, controlled (HCC)    Treated with 4 times daily insulin  based on blood sugar monitoring   Hip sprain    & contusion   Hyperlipidemia due to type 1 diabetes mellitus (HCC)    Currently on atorvastatin .   Hypothyroid    Lipoma    MVP (mitral valve prolapse) 2005   Renally diagnosed back in 2005 (during her pregnancy)_, most recently evaluated in 2012.  [facility], Dr. Joanne Muckle); 11/2010 TTE: Moderate MR, posteriorly directed (recommend TEE) => TEE (01/2011) - mild MVP (anterior leaflet) with Mild-Mod MR.;    Past Surgical History:  Procedure Laterality Date   LAPAROSCOPIC LYSIS OF ADHESIONS     LIPOMA EXCISION Left    LUMBAR MICRODISCECTOMY     TRANSESOPHAGEAL ECHOCARDIOGRAM  02/13/2011   [facility] -> Dr. Joanne Muckle [facility]Card[facility]): EF 55 to 60%.  Borderline LA dilation.  Prolapse of A2 scallop of mitral valve-mild MVP with mild to moderate MR.  (2 eccentric jets, posteriorly directed, c/w anterior leaflet pathology))   TRANSTHORACIC ECHOCARDIOGRAM  12/11/2010   [facility] -> Dr. Joanne Muckle [facility]-Card[facility]): EF 35 to 6%.  Mild LA dilation.  Moderate MR-eccentric/possibly directed jet consistent with antilipid  pathology.  Mild TR, LP HTN, mild PR.  Recommend TEE.   TREADMILL STRESS ECHOCARDIOGRAM     [facility] -> Dr. Joanne Muckle [facility]-Card[facility]): Exercised 10 minutes (11 minutes), achieved Max Heart Rate 162 = 92% Max Predicted Heart Rate, clinically and electrocardiographically negative.  Nonspecific ST and T wave changes.  Prestress echo showed EF 55 to 60% with normal wall motion.  Post-Stress: EF 65 to 70% with nl augmentation of all walls.=> NORMAL STRESS [facility]   Patient Active Problem List   Diagnosis Date Noted   Osteoporosis 09/22/2023   Migraine headache 09/22/2023   Hypothyroidism 11/09/2021   Type 1 diabetes mellitus with hyperglycemia (HCC) 08/22/2020   History of mitral valve prolapse in adulthood 08/23/2010     REFERRING PROVIDER:  Wilhelmenia Harada, MD     REFERRING DIAG: M25.351 (ICD-10-CM) - Hip instability, right   Rationale for Evaluation and Treatment: Rehabilitation  THERAPY DIAG:  Difficulty in walking, not elsewhere classified  Pain in right hip  Abnormal posture  Muscle weakness (generalized)  Pain in joint of right knee  Acute pain of right shoulder  ONSET DATE: 07/21/23   SUBJECTIVE:  SUBJECTIVE STATEMENT: Pt reports ongoing tightness/soreness in hips, but overall she is improving. Some pain in R shoulder with reaching movements. Some improvement with exercises.   ERO: New referral for her shoulder pain is in today. In regards to her shoulder, she feels she has some underlying R shoulder arthritis with history of bursitis in her L shoulder. Thinks she may have had a touch of bursitis on her R shoulder even prior to her fall. During the fall, she did land on her R shoulder and overused it with using the crutches which greatly irritated it. Did get  steroid shot in shoulder 08/11/23. Still gets issues with putting on her shirt -- gets a shooting pain. Most bothered with driving and her arm will start hurting. Pt is right hand dominant so using it at work also makes it fatigued and sore. Feels that the pain is more generalized from the medial arm which can radiate down the forearms.    Eval: T1DM and maintain a good AI1. 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 I like to go sailing and skiing, enjoys trail walking around [facility]. Has 4 dogs and husband is an avid exerciser. I have had multiple LE fx but have been trauma-related. Bilat 5th mets, Rt ankle fx 2 locations. Has had back surgery at L4-5 discectomy. Jan 27th went on a family trip- skiing on new equipment- fell back, more on right side with bruise on Rt shoulder. I think the back of my skiis crossed and opened anteriorly and knees splayed out to strain groin muscles. Bindings did not release in fall. Hauled out by ski patrol and was in [facility]- ER xrays denied fx and sent me out on crutches.  After a week or 2 was not improving and tailbone felt like it was on fire. MRI showed bilat fractures, including sacrum. Did not miss a day at work and was unable to tolerate pain on walker. Developed tendonitis in right shoulder walking around the hospital on crutches. The 17th of Feb, Rt shoulder steroid injection. Discovered hip instability when I stepped into the tub-shower with right foot and felt something shift. When I rolled back to put my socks on, both hips shifted/popped/caught and had 10/10 pain and spent the day in bed. Dr B gave me a steroid shot in right hip on Monday and have had dramatic improvement. Was able to tolerate laying on her side for about 15 min last night. Walked short distances at home without RW.   PERTINENT HISTORY:  H/o ankle fx without surgery. T1DM Lt shoulder bursitis with PT in the past Injection to Rt shoulder recently for tendonitis   PAIN:  Are you having pain? Yes: NPRS  scale: no pain at present, tightness in hamstrings Pain location: Rt hip, tailbone, Lateral Lt thigh sensation Pain description: I feel a sensation down lateral Lt thigh Aggravating factors: walking Relieving factors: reduced WB, injection  PRECAUTIONS:  None  RED FLAGS: None   WEIGHT BEARING RESTRICTIONS:  WBAT  FALLS:  Has patient fallen in last 6 months? Yes. 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  DIAGNOSTIC FINDINGS:  DG hip 08/14/23: Fractures of the bilateral puboacetabular junction, inferior pubic rami and right sacrum a MRI are not well-defined by radiograph. The right inferior ramus fracture is potentially visualized, the other fractures are radiographically occult. There  is no hip dislocation. No pubic symphyseal or sacroiliac diastasis.  PATIENT SURVEYS:  LEFS 18 09/25/23: 53/80   COGNITIVE STATUS: Within functional limits for tasks assessed   SENSATION: WFL  POSTURE:  Standing: Rt shoulder depression, decreased thoracic kyphosis & lumbar lordosis, notable pelvic rotation Supine: Lt leg functionally shorter, Rt anterior innom  HAND DOMINANCE:  Right  GAIT: Eval: RW, slow cadence, short stride  UPPER EXTREMITY ROM:   Active ROM Right 10/07/23 Left 10/07/23  Shoulder flexion 165 158  Shoulder extension    Shoulder abduction 160 Wants to help bringing her arm down 160  Shoulder adduction    Shoulder extension    Shoulder internal rotation T7 with pain T4  Shoulder external rotation 45 with pain 54  Elbow flexion    Elbow extension    Wrist flexion    Wrist extension    Wrist ulnar deviation    Wrist radial deviation    Wrist pronation    Wrist supination     (Blank rows = not tested)  UPPER EXTREMITY MMT:  MMT Right 10/07/23 Left 10/07/23  Shoulder flexion 5 5   Shoulder extension    Shoulder abduction 5 with pain 5  Shoulder adduction    Shoulder extension    Shoulder internal rotation 5 5  Shoulder external rotation 5 5  Middle trapezius in prone 3+ with pain   Lower trapezius in prone 3- with pain   Elbow flexion    Elbow extension    Wrist flexion    Wrist extension    Wrist ulnar deviation    Wrist radial deviation    Wrist pronation    Wrist supination    Grip strength     (Blank rows = not tested)  SPECIAL TESTS:  Roo's test (+) within 10 sec  Painful arc (-) but painful on eccentric control with bringing arm down  Rotator cuff testing otherwise (-)   ULTT (+) median nerve especially, slightly with radial nerve, (-) ulnar nerve   TREATMENT DATE:  Treatment                            4/29: Blank lines following charge title = not provided on this treatment date.   Manual:  TPDN No STM pecs, rhomboids, subscap, upper trap Theracane instruction There-ex: PROM R shoulder Serratus shoulder flexion with RTB 2x5 Bilateral ER RTB 2x10     Treatment                            4/17: Blank lines following charge title = not provided on this treatment date.   Manual:  TPDN No STM pecs, rhomboids, subscap, upper trap There-ex: Supine hamstring stretch with strap- over each shoulder 5 breath holds Happy baby progression There-Act: Discussed work Information systems manager Care:  Nuro-Re-ed: Sidelying ER, abd, horiz abd- PT assist for scapular motion Gait Training:    Treatment                            4/15: Blank lines following charge title = not provided on this treatment date.   Manual:   There-ex: Doorway pec stretch low, middle, high x 30" each There-Act:  Self Care: Positions to avoid, desk ergonomics, sleep positioning to decrease shoulder irritation Neuro-Re-ed: Attempted nerve glide but too irritating for pt Scap squeeze 2x10 Shoulder ER 2x10 Gait Training:  Treatment                             4/10: Blank lines following charge title = not provided on this treatment date.   Manual:  TPDN No Roller to Rt hip and LE STM Rt lumbar paraspinals There-ex: Bar pull off chair Bent over hip ext, ball behind knee There-Act:  Self Care:  Nuro-Re-ed:  Gait Training: Increasing trunk rotation    PATIENT EDUCATION:  Education details: rationale for interventions, HEP  Person educated: Patient Education method: Explanation, Demonstration, Tactile cues, Verbal cues Education comprehension: verbalized understanding, returned demonstration, verbal cues required, tactile cues required, and needs further education     HOME EXERCISE PROGRAM: [id number] Hesch self correction for Rt anterior innominate rotation   ASSESSMENT:  CLINICAL IMPRESSION: Focused on R shoulder complaints today. Pt responded well to GHJ distraction with passive ROM, especially abduction. She experienced some shoulder discomfort with PROM initially, but this resolved with light distraction. Felt relief with TRP to UT/LS. Instructed pt in theracnae use which she felt beneficial and purchased one online. Good tolerance for serratus flexion, though challenging. She did have mild lateral shoulder discomfort with resisted Bil ER, so instructed her to decrease range with this which decreased pain.   Eval: Patient is a 58 y.o. F who was seen today for physical therapy evaluation and treatment for Rt hip pain following an incident on skiis that resulted in strained ER through bilateral LEs. Multiple pelvic fractures and strain through adductors, history of lumbar spine surgical intervention all contributing to pain. Pt does have known tearing in hip labrum but is feeling much better since the injection and would like to avoid surgery if possible.  Pt was very active prior to the accident and would like to return to PLOF. Pt also notes Rt shoulder pain onset since the fall- notable postural changes through thoracic spine  creating biomechanical chain input from pelvis to shoulder.     REHAB POTENTIAL: Good  CLINICAL DECISION MAKING: Stable/uncomplicated  EVALUATION COMPLEXITY: Low   GOALS: Goals reviewed with patient? Yes  SHORT TERM GOALS: Target date: 3/22  Able to ambulate household distances, without AD, without antalgic pattern Baseline: Goal status: IN PROGRESS (weaning off FWW to single crutch 3/21)  2.  Demo proper core control with exercises Baseline:  Goal status: IN PROGRESS 3/21    LONG TERM GOALS: Target date: POC date  Hip strength 90% of left side Baseline: MMT to be performed as appropriate Goal status: INITIAL  2.  Progressing into plyometric motions with minimal to no pain Baseline:  Goal status: INITIAL  3.  Return to work with understanding of exercises & rest breaks to reduce discomfort Baseline:  Goal status: IN PROGRESS  4.  Navigate uneven surfaces with minimal to no pain Baseline:  Goal status: INITIAL  5.  Pt will be able to move R UE in all directions without pain for dressing and driving tasks Baseline:  Goal status: INITIAL  6.  Pt will demo at least 4/5 periscapular strength to demo increased shoulder stability and decreased nerve impingement Baseline:  Goal status: INITIAL     PLAN:  PT FREQUENCY: 1-2x/week  PT DURATION: 12 weeks  PLANNED INTERVENTIONS: [id number]- PT Re-evaluation, 97110-Therapeutic exercises, 97530- Therapeutic activity, 97112- Neuromuscular re-education, 97535- Self Care, 32440- Manual therapy, [phone]- Gait training, [phone]- Aquatic Therapy, [phone]- Electrical stimulation (unattended), Patient/Family education, Balance training, Stair training, Taping, Dry Needling, Joint mobilization, Spinal  mobilization, Cryotherapy, and Moist heat.  PLAN FOR NEXT SESSION: consider aquatics. Pt also interested in interventions looking at hamstring tightness, massage therapy. Strengthen rhomboids, mid traps, low traps; stretch pec minor/major;  nerve glides/stretches  Judie Noun, PTA 10/21/23 11:54 AM

## 2023-10-22 ENCOUNTER — Encounter: Payer: Self-pay | Admitting: Family Medicine

## 2023-10-22 ENCOUNTER — Other Ambulatory Visit (HOSPITAL_COMMUNITY): Payer: Self-pay

## 2023-10-22 ENCOUNTER — Other Ambulatory Visit: Payer: Self-pay

## 2023-10-22 MED ORDER — INSUPEN PEN NEEDLES 32G X 4 MM MISC
1.0000 | Freq: Every day | 3 refills | Status: DC
  Filled 2023-10-22 (×2): qty 300, 50d supply, fill #0
  Filled 2023-12-06: qty 300, 50d supply, fill #1
  Filled 2024-01-22: qty 300, 50d supply, fill #2
  Filled 2024-04-11: qty 300, 50d supply, fill #3

## 2023-10-23 ENCOUNTER — Ambulatory Visit (HOSPITAL_BASED_OUTPATIENT_CLINIC_OR_DEPARTMENT_OTHER): Payer: Commercial Managed Care - PPO | Attending: Orthopaedic Surgery | Admitting: Physical Therapy

## 2023-10-23 ENCOUNTER — Encounter (HOSPITAL_BASED_OUTPATIENT_CLINIC_OR_DEPARTMENT_OTHER): Payer: Self-pay | Admitting: Physical Therapy

## 2023-10-23 DIAGNOSIS — M25561 Pain in right knee: Secondary | ICD-10-CM | POA: Insufficient documentation

## 2023-10-23 DIAGNOSIS — M6281 Muscle weakness (generalized): Secondary | ICD-10-CM | POA: Insufficient documentation

## 2023-10-23 DIAGNOSIS — M25551 Pain in right hip: Secondary | ICD-10-CM | POA: Insufficient documentation

## 2023-10-23 DIAGNOSIS — R293 Abnormal posture: Secondary | ICD-10-CM | POA: Diagnosis not present

## 2023-10-23 DIAGNOSIS — R262 Difficulty in walking, not elsewhere classified: Secondary | ICD-10-CM | POA: Insufficient documentation

## 2023-10-23 DIAGNOSIS — M25511 Pain in right shoulder: Secondary | ICD-10-CM | POA: Insufficient documentation

## 2023-10-23 NOTE — Therapy (Signed)
 OUTPATIENT PHYSICAL THERAPY TREATMENT  Patient Name: Maria Evans MRN: [id number] DOB:11-14-1965, 58 y.o., female Today's Date: 10/23/2023  END OF SESSION:  PT End of Session - 10/23/23 1559     Visit Number 15    Number of Visits 25    Date for PT Re-Evaluation 11/15/23    Authorization Type [facility] [employer]    PT Start Time 1600    PT Stop Time 1642    PT Time Calculation (min) 42 min    Activity Tolerance Patient tolerated treatment well    Behavior During Therapy WFL for tasks assessed/performed                Past Medical History:  Diagnosis Date   Anxiety    Anxiety with depression    Controlled.   Common migraine without aura    Diabetes mellitus type I, controlled (HCC)    Treated with 4 times daily insulin  based on blood sugar monitoring   Hip sprain    & contusion   Hyperlipidemia due to type 1 diabetes mellitus (HCC)    Currently on atorvastatin .   Hypothyroid    Lipoma    MVP (mitral valve prolapse) 2005   Renally diagnosed back in 2005 (during her pregnancy)_, most recently evaluated in 2012.  [facility] - Cardiology, Dr. Joanne Muckle); 11/2010 TTE: Moderate MR, posteriorly directed (recommend TEE) => TEE (01/2011) - mild MVP (anterior leaflet) with Mild-Mod MR.;    Past Surgical History:  Procedure Laterality Date   LAPAROSCOPIC LYSIS OF ADHESIONS     LIPOMA EXCISION Left    LUMBAR MICRODISCECTOMY     TRANSESOPHAGEAL ECHOCARDIOGRAM  02/13/2011   [facility] -> Dr. Joanne Muckle [facility]-Cardiology): EF 55 to 60%.  Borderline LA dilation.  Prolapse of A2 scallop of mitral valve-mild MVP with mild to moderate MR.  (2 eccentric jets, posteriorly directed, c/w anterior leaflet pathology))   TRANSTHORACIC ECHOCARDIOGRAM  12/11/2010   [facility] -> Dr. Joanne Muckle [facility]-Cardiology): EF 35 to 6%.  Mild LA dilation.  Moderate MR-eccentric/possibly directed jet consistent with antilipid  pathology.  Mild TR, LP HTN, mild PR.  Recommend TEE.   TREADMILL STRESS ECHOCARDIOGRAM     [facility] -> Dr. Joanne Muckle [facility]-Cardiology): Exercised 10 minutes (11 minutes), achieved Max Heart Rate 162 = 92% Max Predicted Heart Rate, clinically and electrocardiographically negative.  Nonspecific ST and T wave changes.  Prestress echo showed EF 55 to 60% with normal wall motion.  Post-Stress: EF 65 to 70% with nl augmentation of all walls.=> NORMAL STRESS [facility]   Patient Active Problem List   Diagnosis Date Noted   Osteoporosis 09/22/2023   Migraine headache 09/22/2023   Hypothyroidism 11/09/2021   Type 1 diabetes mellitus with hyperglycemia (HCC) 08/22/2020   History of mitral valve prolapse in adulthood 08/23/2010     REFERRING PROVIDER:  Wilhelmenia Harada, MD     REFERRING DIAG: M25.351 (ICD-10-CM) - Hip instability, right   Rationale for Evaluation and Treatment: Rehabilitation  THERAPY DIAG:  Difficulty in walking, not elsewhere classified  Pain in right hip  Abnormal posture  Muscle weakness (generalized)  Pain in joint of right knee  Acute pain of right shoulder  ONSET DATE: 07/21/23   SUBJECTIVE:  SUBJECTIVE STATEMENT: I am walking faster and it feels more natural. I had to take percocet for shoulder pain.  Pain with overhead reaching. Pain reaching behind back. Reports occasional popping clicking but I am guarding so I try not to let it.   ERO: New referral for her shoulder pain is in today. In regards to her shoulder, she feels she has some underlying R shoulder arthritis with history of bursitis in her L shoulder. Thinks she may have had a touch of bursitis on her R shoulder even prior to her fall. During the fall, she did land on her R shoulder and  overused it with using the crutches which greatly irritated it. Did get steroid shot in shoulder 08/11/23. Still gets issues with putting on her shirt -- gets a shooting pain. Most bothered with driving and her arm will start hurting. Pt is right hand dominant so using it at work also makes it fatigued and sore. Feels that the pain is more generalized from the medial arm which can radiate down the forearms.    Eval: T1DM and maintain a good AI1. 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 I like to go sailing and skiing, enjoys trail walking around [facility] Has 4 dogs and husband is an avid exerciser. I have had multiple LE fx but have been trauma-related. Bilat 5th mets, Rt ankle fx 2 locations. Has had back surgery at L4-5 discectomy. Jan 27th went on a family trip- skiing on new equipment- fell back, more on right side with bruise on Rt shoulder. I think the back of my skiis crossed and opened anteriorly and knees splayed out to strain groin muscles. Bindings did not release in fall. Hauled out by ski patrol and was in [facility]- ER xrays denied fx and sent me out on crutches.  After a week or 2 was not improving and tailbone felt like it was on fire. MRI showed bilat fractures, including sacrum. Did not miss a day at work and was unable to tolerate pain on walker. Developed tendonitis in right shoulder walking around the hospital on crutches. The 17th of Feb, Rt shoulder steroid injection. Discovered hip instability when I stepped into the tub-shower with right foot and felt something shift. When I rolled back to put my socks on, both hips shifted/popped/caught and had 10/10 pain and spent the day in bed. Dr B gave me a steroid shot in right hip on Monday and have had dramatic improvement. Was able to tolerate laying on her side for about 15 min last night. Walked short distances at home without RW.   PERTINENT HISTORY:  H/o ankle fx without surgery. T1DM Lt shoulder bursitis with PT in the past Injection to Rt  shoulder recently for tendonitis   PAIN:  Are you having pain? Yes: NPRS scale: no pain at present, tightness in hamstrings Pain location: Rt hip, tailbone, Lateral Lt thigh sensation Pain description: I feel a sensation down lateral Lt thigh Aggravating factors: walking Relieving factors: reduced WB, injection  PRECAUTIONS:  None  RED FLAGS: None   WEIGHT BEARING RESTRICTIONS:  WBAT  FALLS:  Has patient fallen in last 6 months? Yes. 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  DIAGNOSTIC FINDINGS:  DG hip 08/14/23: Fractures of the bilateral puboacetabular junction, inferior pubic rami and right sacrum a MRI are not  well-defined by radiograph. The right inferior ramus fracture is potentially visualized, the other fractures are radiographically occult. There is no hip dislocation. No pubic symphyseal or sacroiliac diastasis.  PATIENT SURVEYS:  LEFS 18 09/25/23: 53/80   COGNITIVE STATUS: Within functional limits for tasks assessed   SENSATION: WFL  POSTURE:  Standing: Rt shoulder depression, decreased thoracic kyphosis & lumbar lordosis, notable pelvic rotation Supine: Lt leg functionally shorter, Rt anterior innom  HAND DOMINANCE:  Right  GAIT: Eval: RW, slow cadence, short stride  UPPER EXTREMITY ROM:   Active ROM Right 10/07/23 Left 10/07/23  Shoulder flexion 165 158  Shoulder extension    Shoulder abduction 160 Wants to help bringing her arm down 160  Shoulder adduction    Shoulder extension    Shoulder internal rotation T7 with pain T4  Shoulder external rotation 45 with pain 54  Elbow flexion    Elbow extension    Wrist flexion    Wrist extension    Wrist ulnar deviation    Wrist radial deviation    Wrist pronation    Wrist supination     (Blank rows = not tested)  UPPER  EXTREMITY MMT:  MMT Right 10/07/23 Left 10/07/23  Shoulder flexion 5 5  Shoulder extension    Shoulder abduction 5 with pain 5  Shoulder adduction    Shoulder extension    Shoulder internal rotation 5 5  Shoulder external rotation 5 5  Middle trapezius in prone 3+ with pain   Lower trapezius in prone 3- with pain   Elbow flexion    Elbow extension    Wrist flexion    Wrist extension    Wrist ulnar deviation    Wrist radial deviation    Wrist pronation    Wrist supination    Grip strength     (Blank rows = not tested)  SPECIAL TESTS:  Roo's test (+) within 10 sec  Painful arc (-) but painful on eccentric control with bringing arm down  Rotator cuff testing otherwise (-)   ULTT (+) median nerve especially, slightly with radial nerve, (-) ulnar nerve   TREATMENT DATE:  5/1: see plan  Treatment                            4/29: Blank lines following charge title = not provided on this treatment date.   Manual:  TPDN No STM pecs, rhomboids, subscap, upper trap Theracane instruction There-ex: PROM R shoulder Serratus shoulder flexion with RTB 2x5 Bilateral ER RTB 2x10     Treatment                            4/17: Blank lines following charge title = not provided on this treatment date.   Manual:  TPDN No STM pecs, rhomboids, subscap, upper trap There-ex: Supine hamstring stretch with strap- over each shoulder 5 breath holds Happy baby progression There-Act: Discussed work Information systems manager Care:  Nuro-Re-ed: Sidelying ER, abd, horiz abd- PT assist for scapular motion Gait Training:    Treatment                            4/15: Blank lines following charge title = not provided on this treatment date.   Manual:   There-ex: Doorway pec stretch low, middle, high x 30" each There-Act:  Self Care: Positions to avoid, desk ergonomics, sleep  positioning to decrease shoulder irritation Neuro-Re-ed: Attempted nerve glide but too irritating for pt Scap  squeeze 2x10 Shoulder ER 2x10 Gait Training:    Treatment                            4/10: Blank lines following charge title = not provided on this treatment date.   Manual:  TPDN No Roller to Rt hip and LE STM Rt lumbar paraspinals There-ex: Bar pull off chair Bent over hip ext, ball behind knee There-Act:  Self Care:  Nuro-Re-ed:  Gait Training: Increasing trunk rotation    PATIENT EDUCATION:  Education details: rationale for interventions, HEP  Person educated: Patient Education method: Programmer, multimedia, Demonstration, Tactile cues, Verbal cues Education comprehension: verbalized understanding, returned demonstration, verbal cues required, tactile cues required, and needs further education     HOME EXERCISE PROGRAM: [id number] Hesch self correction for Rt anterior innominate rotation   ASSESSMENT:  CLINICAL IMPRESSION: Time taken today to evaluate for differential dx of adhesive capsulitis and labral tear in right shoulder. Discussed thoughts with patient. She has an appt with Dr Hermina Loosen coming up and I would like to explore the option of lidocaine  injection to labrum as diagnostic tool. Pt has DM and has blood sugar imbalances with steroid injections- discussed using that PRN if s/s of adhesive capsulitis increase. I placed ktape on her shoulder for deltoid activation which did help feel more supported, did not present with capsular end feel, significant anterior humeral head shift in testing. Pain with passive IR but relieved with AP pressure to GHJ for alignment.   Eval: Patient is a 58 y.o. F who was seen today for physical therapy evaluation and treatment for Rt hip pain following an incident on skiis that resulted in strained ER through bilateral LEs. Multiple pelvic fractures and strain through adductors, history of lumbar spine surgical intervention all contributing to pain. Pt does have known tearing in hip labrum but is feeling much better since the injection and  would like to avoid surgery if possible.  Pt was very active prior to the accident and would like to return to PLOF. Pt also notes Rt shoulder pain onset since the fall- notable postural changes through thoracic spine creating biomechanical chain input from pelvis to shoulder.     REHAB POTENTIAL: Good  CLINICAL DECISION MAKING: Stable/uncomplicated  EVALUATION COMPLEXITY: Low   GOALS: Goals reviewed with patient? Yes  SHORT TERM GOALS: Target date: 3/22  Able to ambulate household distances, without AD, without antalgic pattern Baseline: Goal status: IN PROGRESS (weaning off FWW to single crutch 3/21)  2.  Demo proper core control with exercises Baseline:  Goal status: IN PROGRESS 3/21    LONG TERM GOALS: Target date: POC date  Hip strength 90% of left side Baseline: MMT to be performed as appropriate Goal status: INITIAL  2.  Progressing into plyometric motions with minimal to no pain Baseline:  Goal status: INITIAL  3.  Return to work with understanding of exercises & rest breaks to reduce discomfort Baseline:  Goal status: IN PROGRESS  4.  Navigate uneven surfaces with minimal to no pain Baseline:  Goal status: INITIAL  5.  Pt will be able to move R UE in all directions without pain for dressing and driving tasks Baseline:  Goal status: INITIAL  6.  Pt will demo at least 4/5 periscapular strength to demo increased shoulder stability and decreased nerve impingement Baseline:  Goal status: INITIAL  PLAN:  PT FREQUENCY: 1-2x/week  PT DURATION: 12 weeks  PLANNED INTERVENTIONS: [id number]- PT Re-evaluation, 97110-Therapeutic exercises, 97530- Therapeutic activity, 97112- Neuromuscular re-education, 97535- Self Care, [id number]- Manual therapy, [phone]- Gait training, [id number]- Aquatic Therapy, [phone]- Electrical stimulation (unattended), Patient/Family education, Balance training, Stair training, Taping, Dry Needling, Joint mobilization, Spinal mobilization, Cryotherapy,  and Moist heat.  PLAN FOR NEXT SESSION: consider aquatics. Pt also interested in interventions looking at hamstring tightness, massage therapy. Strengthen rhomboids, mid traps, low traps; stretch pec minor/major; nerve glides/stretches  Cris Talavera C. Mckinzie Saksa PT, DPT 10/23/23 4:57 PM

## 2023-10-23 NOTE — Telephone Encounter (Signed)
 Last shoulder injection 08/11/23.   Please reach out to pt to assist with scheduling visit for re-eval and shoulder injection.   Thank you!

## 2023-10-24 ENCOUNTER — Other Ambulatory Visit (HOSPITAL_COMMUNITY): Payer: Self-pay

## 2023-10-27 ENCOUNTER — Other Ambulatory Visit (HOSPITAL_COMMUNITY): Payer: Self-pay

## 2023-10-27 ENCOUNTER — Other Ambulatory Visit: Payer: Self-pay

## 2023-10-27 MED ORDER — ESCITALOPRAM OXALATE 20 MG PO TABS
20.0000 mg | ORAL_TABLET | Freq: Every day | ORAL | 0 refills | Status: DC
  Filled 2023-10-27: qty 90, 90d supply, fill #0

## 2023-10-28 ENCOUNTER — Encounter (HOSPITAL_BASED_OUTPATIENT_CLINIC_OR_DEPARTMENT_OTHER): Payer: Self-pay

## 2023-10-28 ENCOUNTER — Ambulatory Visit (HOSPITAL_BASED_OUTPATIENT_CLINIC_OR_DEPARTMENT_OTHER): Payer: Commercial Managed Care - PPO

## 2023-10-28 DIAGNOSIS — R262 Difficulty in walking, not elsewhere classified: Secondary | ICD-10-CM

## 2023-10-28 DIAGNOSIS — M25561 Pain in right knee: Secondary | ICD-10-CM | POA: Diagnosis not present

## 2023-10-28 DIAGNOSIS — R293 Abnormal posture: Secondary | ICD-10-CM | POA: Diagnosis not present

## 2023-10-28 DIAGNOSIS — M6281 Muscle weakness (generalized): Secondary | ICD-10-CM

## 2023-10-28 DIAGNOSIS — F4322 Adjustment disorder with anxiety: Secondary | ICD-10-CM | POA: Diagnosis not present

## 2023-10-28 DIAGNOSIS — M25511 Pain in right shoulder: Secondary | ICD-10-CM | POA: Diagnosis not present

## 2023-10-28 DIAGNOSIS — M25551 Pain in right hip: Secondary | ICD-10-CM

## 2023-10-28 NOTE — Therapy (Signed)
 OUTPATIENT PHYSICAL THERAPY TREATMENT  Patient Name: Maria Evans MRN: [id number] DOB:27-Dec-1965, 58 y.o., female Today's Date: 10/28/2023  END OF SESSION:  PT End of Session - 10/28/23 1601     Visit Number 16    Number of Visits 25    Date for PT Re-Evaluation 11/15/23    Authorization Type [facility] [employer]    PT Start Time 1602    PT Stop Time 1650    PT Time Calculation (min) 48 min    Activity Tolerance Patient tolerated treatment well    Behavior During Therapy WFL for tasks assessed/performed                 Past Medical History:  Diagnosis Date   Anxiety    Anxiety with depression    Controlled.   Common migraine without aura    Diabetes mellitus type I, controlled (HCC)    Treated with 4 times daily insulin  based on blood sugar monitoring   Hip sprain    & contusion   Hyperlipidemia due to type 1 diabetes mellitus (HCC)    Currently on atorvastatin .   Hypothyroid    Lipoma    MVP (mitral valve prolapse) 2005   Renally diagnosed back in 2005 (during her pregnancy)_, most recently evaluated in 2012.  [facility], Dr. Joanne Muckle); 11/2010 TTE: Moderate MR, posteriorly directed (recommend TEE) => TEE (01/2011) - mild MVP (anterior leaflet) with Mild-Mod MR.;    Past Surgical History:  Procedure Laterality Date   LAPAROSCOPIC LYSIS OF ADHESIONS     LIPOMA EXCISION Left    LUMBAR MICRODISCECTOMY     TRANSESOPHAGEAL ECHOCARDIOGRAM  02/13/2011   [facility] -> Dr. Joanne Muckle [facility]): EF 55 to 60%.  Borderline LA dilation.  Prolapse of A2 scallop of mitral valve-mild MVP with mild to moderate MR.  (2 eccentric jets, posteriorly directed, c/w anterior leaflet pathology))   TRANSTHORACIC ECHOCARDIOGRAM  12/11/2010   [facility] -> Dr. Joanne Muckle [facility]): EF 35 to 6%.  Mild LA dilation.  Moderate MR-eccentric/possibly directed jet consistent with  antilipid pathology.  Mild TR, LP HTN, mild PR.  Recommend TEE.   TREADMILL STRESS ECHOCARDIOGRAM     [facility] -> Dr. Joanne Muckle [facility]): Exercised 10 minutes (11 minutes), achieved Max Heart Rate 162 = 92% Max Predicted Heart Rate, clinically and electrocardiographically negative.  Nonspecific ST and T wave changes.  Prestress echo showed EF 55 to 60% with normal wall motion.  Post-Stress: EF 65 to 70% with nl augmentation of all walls.=> NORMAL STRESS [facility]   Patient Active Problem List   Diagnosis Date Noted   Osteoporosis 09/22/2023   Migraine headache 09/22/2023   Hypothyroidism 11/09/2021   Type 1 diabetes mellitus with hyperglycemia (HCC) 08/22/2020   History of mitral valve prolapse in adulthood 08/23/2010     REFERRING PROVIDER:  Wilhelmenia Harada, MD     REFERRING DIAG: M25.351 (ICD-10-CM) - Hip instability, right   Rationale for Evaluation and Treatment: Rehabilitation  THERAPY DIAG:  Difficulty in walking, not elsewhere classified  Pain in right hip  Abnormal posture  Muscle weakness (generalized)  Pain in joint of right knee  Acute pain of right shoulder  ONSET DATE: 07/21/23   SUBJECTIVE:  SUBJECTIVE STATEMENT: Pt reports she was able to steer her sailboat last weekend without significant difficulty or pain. Has been using theracane. Shoulder has not bothered her today.   ERO: New referral for her shoulder pain is in today. In regards to her shoulder, she feels she has some underlying R shoulder arthritis with history of bursitis in her L shoulder. Thinks she may have had a touch of bursitis on her R shoulder even prior to her fall. During the fall, she did land on her R shoulder and overused it with using the crutches which greatly irritated  it. Did get steroid shot in shoulder 08/11/23. Still gets issues with putting on her shirt -- gets a shooting pain. Most bothered with driving and her arm will start hurting. Pt is right hand dominant so using it at work also makes it fatigued and sore. Feels that the pain is more generalized from the medial arm which can radiate down the forearms.    Eval: T1DM and maintain a good AI1. 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 I like to go sailing and skiing, enjoys trail walking around [facility]. Has 4 dogs and husband is an avid exerciser. I have had multiple LE fx but have been trauma-related. Bilat 5th mets, Rt ankle fx 2 locations. Has had back surgery at L4-5 discectomy. Jan 27th went on a family trip- skiing on new equipment- fell back, more on right side with bruise on Rt shoulder. I think the back of my skiis crossed and opened anteriorly and knees splayed out to strain groin muscles. Bindings did not release in fall. Hauled out by ski patrol and was in [facility]- ER xrays denied fx and sent me out on crutches.  After a week or 2 was not improving and tailbone felt like it was on fire. MRI showed bilat fractures, including sacrum. Did not miss a day at work and was unable to tolerate pain on walker. Developed tendonitis in right shoulder walking around the hospital on crutches. The 17th of Feb, Rt shoulder steroid injection. Discovered hip instability when I stepped into the tub-shower with right foot and felt something shift. When I rolled back to put my socks on, both hips shifted/popped/caught and had 10/10 pain and spent the day in bed. Dr B gave me a steroid shot in right hip on Monday and have had dramatic improvement. Was able to tolerate laying on her side for about 15 min last night. Walked short distances at home without RW.   PERTINENT HISTORY:  H/o ankle fx without surgery. T1DM Lt shoulder bursitis with PT in the past Injection to Rt shoulder recently for tendonitis   PAIN:  Are you having pain?  Yes: NPRS scale: no pain at present, tightness in hamstrings Pain location: Rt hip, tailbone, Lateral Lt thigh sensation Pain description: I feel a sensation down lateral Lt thigh Aggravating factors: walking Relieving factors: reduced WB, injection  PRECAUTIONS:  None  RED FLAGS: None   WEIGHT BEARING RESTRICTIONS:  WBAT  FALLS:  Has patient fallen in last 6 months? Yes. 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  DIAGNOSTIC FINDINGS:  DG hip 08/14/23: Fractures of the bilateral puboacetabular junction, inferior pubic rami and right sacrum a MRI are not well-defined by radiograph. The right inferior ramus fracture is potentially visualized, the other fractures are  radiographically occult. There is no hip dislocation. No pubic symphyseal or sacroiliac diastasis.  PATIENT SURVEYS:  LEFS 18 09/25/23: 53/80   COGNITIVE STATUS: Within functional limits for tasks assessed   SENSATION: WFL  POSTURE:  Standing: Rt shoulder depression, decreased thoracic kyphosis & lumbar lordosis, notable pelvic rotation Supine: Lt leg functionally shorter, Rt anterior innom  HAND DOMINANCE:  Right  GAIT: Eval: RW, slow cadence, short stride  UPPER EXTREMITY ROM:   Active ROM Right 10/07/23 Left 10/07/23  Shoulder flexion 165 158  Shoulder extension    Shoulder abduction 160 Wants to help bringing her arm down 160  Shoulder adduction    Shoulder extension    Shoulder internal rotation T7 with pain T4  Shoulder external rotation 45 with pain 54  Elbow flexion    Elbow extension    Wrist flexion    Wrist extension    Wrist ulnar deviation    Wrist radial deviation    Wrist pronation    Wrist supination     (Blank rows = not tested)  UPPER EXTREMITY MMT:  MMT Right 10/07/23 Left 10/07/23  Shoulder  flexion 5 5  Shoulder extension    Shoulder abduction 5 with pain 5  Shoulder adduction    Shoulder extension    Shoulder internal rotation 5 5  Shoulder external rotation 5 5  Middle trapezius in prone 3+ with pain   Lower trapezius in prone 3- with pain   Elbow flexion    Elbow extension    Wrist flexion    Wrist extension    Wrist ulnar deviation    Wrist radial deviation    Wrist pronation    Wrist supination    Grip strength     (Blank rows = not tested)  SPECIAL TESTS:  Roo's test (+) within 10 sec  Painful arc (-) but painful on eccentric control with bringing arm down  Rotator cuff testing otherwise (-)   ULTT (+) median nerve especially, slightly with radial nerve, (-) ulnar nerve   TREATMENT DATE:   5/6: -Butterfly stretch supine -Bridge x20 -90/90 march with tA  -R shoulder rhythmic stabilization -TPR to R scapular mm -quadruped cat/cow x10ea -qped alternating shoulder taps x10 -qped LE extensions -bird dog x5 -prone shoulder row 4# 2x10 R -prone hip extension with knee flexed 2x10ea -wall clocks RTB 2x5R -seated on physioball:  -marches 2x10  -LAQ 2x10ea Squats to plinth 2x10 Rhomboid stretch in doorway   5/1: see plan  Treatment                            4/29: Blank lines following charge title = not provided on this treatment date.   Manual:  TPDN No STM pecs, rhomboids, subscap, upper trap Theracane instruction There-ex: PROM R shoulder Serratus shoulder flexion with RTB 2x5 Bilateral ER RTB 2x10     Treatment                            4/17: Blank lines following charge title = not provided on this treatment date.   Manual:  TPDN No STM pecs, rhomboids, subscap, upper trap There-ex: Supine hamstring stretch with strap- over each shoulder 5 breath holds Happy baby progression There-Act: Discussed work Information systems manager Care:  Nuro-Re-ed: Sidelying ER, abd, horiz abd- PT assist for scapular motion Gait  Training:    Treatment  4/15: Blank lines following charge title = not provided on this treatment date.   Manual:   There-ex: Doorway pec stretch low, middle, high x 30" each There-Act:  Self Care: Positions to avoid, desk ergonomics, sleep positioning to decrease shoulder irritation Neuro-Re-ed: Attempted nerve glide but too irritating for pt Scap squeeze 2x10 Shoulder ER 2x10 Gait Training:    Treatment                            4/10: Blank lines following charge title = not provided on this treatment date.   Manual:  TPDN No Roller to Rt hip and LE STM Rt lumbar paraspinals There-ex: Bar pull off chair Bent over hip ext, ball behind knee There-Act:  Self Care:  Nuro-Re-ed:  Gait Training: Increasing trunk rotation    PATIENT EDUCATION:  Education details: rationale for interventions, HEP  Person educated: Patient Education method: Explanation, Demonstration, Tactile cues, Verbal cues Education comprehension: verbalized understanding, returned demonstration, verbal cues required, tactile cues required, and needs further education     HOME EXERCISE PROGRAM: [id number] Hesch self correction for Rt anterior innominate rotation   ASSESSMENT:  CLINICAL IMPRESSION: Pt had some R shoulder pain with lateral rhythmic stabilization in supine which decreased following TPR to scapular mm. Good tolerance for quadruped activities and core stabilization progressions. Pt did report fatigue with resisted wall clocks and some scapular discomfort following this. Felt relief following rhomboid stretch in doorway.   Eval: Patient is a 58 y.o. F who was seen today for physical therapy evaluation and treatment for Rt hip pain following an incident on skiis that resulted in strained ER through bilateral LEs. Multiple pelvic fractures and strain through adductors, history of lumbar spine surgical intervention all contributing to pain. Pt does have known  tearing in hip labrum but is feeling much better since the injection and would like to avoid surgery if possible.  Pt was very active prior to the accident and would like to return to PLOF. Pt also notes Rt shoulder pain onset since the fall- notable postural changes through thoracic spine creating biomechanical chain input from pelvis to shoulder.     REHAB POTENTIAL: Good  CLINICAL DECISION MAKING: Stable/uncomplicated  EVALUATION COMPLEXITY: Low   GOALS: Goals reviewed with patient? Yes  SHORT TERM GOALS: Target date: 3/22  Able to ambulate household distances, without AD, without antalgic pattern Baseline: Goal status: IN PROGRESS (weaning off FWW to single crutch 3/21)  2.  Demo proper core control with exercises Baseline:  Goal status: IN PROGRESS 3/21    LONG TERM GOALS: Target date: POC date  Hip strength 90% of left side Baseline: MMT to be performed as appropriate Goal status: INITIAL  2.  Progressing into plyometric motions with minimal to no pain Baseline:  Goal status: INITIAL  3.  Return to work with understanding of exercises & rest breaks to reduce discomfort Baseline:  Goal status: IN PROGRESS  4.  Navigate uneven surfaces with minimal to no pain Baseline:  Goal status: INITIAL  5.  Pt will be able to move R UE in all directions without pain for dressing and driving tasks Baseline:  Goal status: INITIAL  6.  Pt will demo at least 4/5 periscapular strength to demo increased shoulder stability and decreased nerve impingement Baseline:  Goal status: INITIAL     PLAN:  PT FREQUENCY: 1-2x/week  PT DURATION: 12 weeks  PLANNED INTERVENTIONS: [id number]- PT Re-evaluation, 97110-Therapeutic exercises, 97530- Therapeutic  activity, [id number]- Neuromuscular re-education, [phone]- Self Care, [id number]- Manual therapy, [phone]- Gait training, [phone]- Aquatic Therapy, [phone]- Electrical stimulation (unattended), Patient/Family education, Balance training, Stair training,  Taping, Dry Needling, Joint mobilization, Spinal mobilization, Cryotherapy, and Moist heat.  PLAN FOR NEXT SESSION: consider aquatics. Pt also interested in interventions looking at hamstring tightness, massage therapy. Strengthen rhomboids, mid traps, low traps; stretch pec minor/major; nerve glides/stretches  Herb Loges, PTA  10/28/23 5:06 PM

## 2023-10-30 ENCOUNTER — Ambulatory Visit (HOSPITAL_BASED_OUTPATIENT_CLINIC_OR_DEPARTMENT_OTHER): Payer: Commercial Managed Care - PPO | Admitting: Physical Therapy

## 2023-10-30 ENCOUNTER — Encounter (HOSPITAL_BASED_OUTPATIENT_CLINIC_OR_DEPARTMENT_OTHER): Payer: Self-pay | Admitting: Physical Therapy

## 2023-10-30 DIAGNOSIS — M25551 Pain in right hip: Secondary | ICD-10-CM

## 2023-10-30 DIAGNOSIS — F4322 Adjustment disorder with anxiety: Secondary | ICD-10-CM | POA: Diagnosis not present

## 2023-10-30 DIAGNOSIS — M6281 Muscle weakness (generalized): Secondary | ICD-10-CM | POA: Diagnosis not present

## 2023-10-30 DIAGNOSIS — R262 Difficulty in walking, not elsewhere classified: Secondary | ICD-10-CM

## 2023-10-30 DIAGNOSIS — M25561 Pain in right knee: Secondary | ICD-10-CM | POA: Diagnosis not present

## 2023-10-30 DIAGNOSIS — R293 Abnormal posture: Secondary | ICD-10-CM

## 2023-10-30 DIAGNOSIS — M25511 Pain in right shoulder: Secondary | ICD-10-CM | POA: Diagnosis not present

## 2023-10-30 NOTE — Therapy (Signed)
 OUTPATIENT PHYSICAL THERAPY TREATMENT  Patient Name: Maria Evans MRN: [id number] DOB:05-11-1966, 58 y.o., female Today's Date: 11/01/2023  END OF SESSION:  PT End of Session - 11/01/23 1154     Visit Number 17    Number of Visits 25    Date for PT Re-Evaluation 11/15/23    Authorization Type [facility] [employer]    PT Start Time 1624    PT Stop Time 1700    PT Time Calculation (min) 36 min    Activity Tolerance Patient tolerated treatment well    Behavior During Therapy WFL for tasks assessed/performed                 Past Medical History:  Diagnosis Date   Anxiety    Anxiety with depression    Controlled.   Common migraine without aura    Diabetes mellitus type I, controlled (HCC)    Treated with 4 times daily insulin  based on blood sugar monitoring   Hip sprain    & contusion   Hyperlipidemia due to type 1 diabetes mellitus (HCC)    Currently on atorvastatin .   Hypothyroid    Lipoma    MVP (mitral valve prolapse) 2005   Renally diagnosed back in 2005 (during her pregnancy)_, most recently evaluated in 2012.  [facility] - [facility], Dr. Joanne Muckle); 11/2010 TTE: Moderate MR, posteriorly directed (recommend TEE) => TEE (01/2011) - mild MVP (anterior leaflet) with Mild-Mod MR.;    Past Surgical History:  Procedure Laterality Date   LAPAROSCOPIC LYSIS OF ADHESIONS     LIPOMA EXCISION Left    LUMBAR MICRODISCECTOMY     TRANSESOPHAGEAL ECHOCARDIOGRAM  02/13/2011   [facility] -> Dr. Joanne Muckle [facility]): EF 55 to 60%.  Borderline LA dilation.  Prolapse of A2 scallop of mitral valve-mild MVP with mild to moderate MR.  (2 eccentric jets, posteriorly directed, c/w anterior leaflet pathology))   TRANSTHORACIC ECHOCARDIOGRAM  12/11/2010   [facility] -> Dr. Joanne Muckle [facility]): EF 35 to 6%.  Mild LA dilation.  Moderate MR-eccentric/possibly directed jet consistent with  antilipid pathology.  Mild TR, LP HTN, mild PR.  Recommend TEE.   TREADMILL STRESS ECHOCARDIOGRAM     [facility] -> Dr. Joanne Muckle [facility]): Exercised 10 minutes (11 minutes), achieved Max Heart Rate 162 = 92% Max Predicted Heart Rate, clinically and electrocardiographically negative.  Nonspecific ST and T wave changes.  Prestress echo showed EF 55 to 60% with normal wall motion.  Post-Stress: EF 65 to 70% with nl augmentation of all walls.=> NORMAL STRESS [facility]   Patient Active Problem List   Diagnosis Date Noted   Osteoporosis 09/22/2023   Migraine headache 09/22/2023   Hypothyroidism 11/09/2021   Type 1 diabetes mellitus with hyperglycemia (HCC) 08/22/2020   History of mitral valve prolapse in adulthood 08/23/2010     REFERRING PROVIDER:  Wilhelmenia Harada, MD     REFERRING DIAG: M25.351 (ICD-10-CM) - Hip instability, right   Rationale for Evaluation and Treatment: Rehabilitation  THERAPY DIAG:  Difficulty in walking, not elsewhere classified  Pain in right hip  Abnormal posture  Muscle weakness (generalized)  ONSET DATE: 07/21/23   SUBJECTIVE:  SUBJECTIVE STATEMENT: I realize I need to build up my LE strength. The door stretch sets my shoulder on fire. I was crawling on my elbow on the bed I really felt it.   ERO: New referral for her shoulder pain is in today. In regards to her shoulder, she feels she has some underlying R shoulder arthritis with history of bursitis in her L shoulder. Thinks she may have had a touch of bursitis on her R shoulder even prior to her fall. During the fall, she did land on her R shoulder and overused it with using the crutches which greatly irritated it. Did get steroid shot in shoulder 08/11/23. Still gets issues with putting on  her shirt -- gets a shooting pain. Most bothered with driving and her arm will start hurting. Pt is right hand dominant so using it at work also makes it fatigued and sore. Feels that the pain is more generalized from the medial arm which can radiate down the forearms.    Eval: T1DM and maintain a good AI1. 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 I like to go sailing and skiing, enjoys trail walking around [facility]. Has 4 dogs and husband is an avid exerciser. I have had multiple LE fx but have been trauma-related. Bilat 5th mets, Rt ankle fx 2 locations. Has had back surgery at L4-5 discectomy. Jan 27th went on a family trip- skiing on new equipment- fell back, more on right side with bruise on Rt shoulder. I think the back of my skiis crossed and opened anteriorly and knees splayed out to strain groin muscles. Bindings did not release in fall. Hauled out by ski patrol and was in [facility]- ER xrays denied fx and sent me out on crutches.  After a week or 2 was not improving and tailbone felt like it was on fire. MRI showed bilat fractures, including sacrum. Did not miss a day at work and was unable to tolerate pain on walker. Developed tendonitis in right shoulder walking around the hospital on crutches. The 17th of Feb, Rt shoulder steroid injection. Discovered hip instability when I stepped into the tub-shower with right foot and felt something shift. When I rolled back to put my socks on, both hips shifted/popped/caught and had 10/10 pain and spent the day in bed. Dr B gave me a steroid shot in right hip on Monday and have had dramatic improvement. Was able to tolerate laying on her side for about 15 min last night. Walked short distances at home without RW.   PERTINENT HISTORY:  H/o ankle fx without surgery. T1DM Lt shoulder bursitis with PT in the past Injection to Rt shoulder recently for tendonitis   PAIN:  Are you having pain? Yes: NPRS scale: no pain at present, tightness in hamstrings Pain location: Rt  hip, tailbone, Lateral Lt thigh sensation Pain description: I feel a sensation down lateral Lt thigh Aggravating factors: walking Relieving factors: reduced WB, injection  PRECAUTIONS:  None  RED FLAGS: None   WEIGHT BEARING RESTRICTIONS:  WBAT  FALLS:  Has patient fallen in last 6 months? Yes. 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  DIAGNOSTIC FINDINGS:  DG hip 08/14/23: Fractures of the bilateral puboacetabular junction, inferior pubic rami and right sacrum a MRI are not well-defined by radiograph. The right inferior ramus fracture is potentially  visualized, the other fractures are radiographically occult. There is no hip dislocation. No pubic symphyseal or sacroiliac diastasis.  PATIENT SURVEYS:  LEFS 18 09/25/23: 53/80   COGNITIVE STATUS: Within functional limits for tasks assessed   SENSATION: WFL  POSTURE:  Standing: Rt shoulder depression, decreased thoracic kyphosis & lumbar lordosis, notable pelvic rotation Supine: Lt leg functionally shorter, Rt anterior innom  HAND DOMINANCE:  Right  GAIT: Eval: RW, slow cadence, short stride  UPPER EXTREMITY ROM:   Active ROM Right 10/07/23 Left 10/07/23  Shoulder flexion 165 158  Shoulder extension    Shoulder abduction 160 Wants to help bringing her arm down 160  Shoulder adduction    Shoulder extension    Shoulder internal rotation T7 with pain T4  Shoulder external rotation 45 with pain 54  Elbow flexion    Elbow extension    Wrist flexion    Wrist extension    Wrist ulnar deviation    Wrist radial deviation    Wrist pronation    Wrist supination     (Blank rows = not tested)  UPPER EXTREMITY MMT:  MMT Right 10/07/23 Left 10/07/23  Shoulder flexion 5 5  Shoulder extension    Shoulder abduction 5 with pain 5  Shoulder  adduction    Shoulder extension    Shoulder internal rotation 5 5  Shoulder external rotation 5 5  Middle trapezius in prone 3+ with pain   Lower trapezius in prone 3- with pain   Elbow flexion    Elbow extension    Wrist flexion    Wrist extension    Wrist ulnar deviation    Wrist radial deviation    Wrist pronation    Wrist supination    Grip strength     (Blank rows = not tested)  SPECIAL TESTS:  Roo's test (+) within 10 sec  Painful arc (-) but painful on eccentric control with bringing arm down  Rotator cuff testing otherwise (-)   ULTT (+) median nerve especially, slightly with radial nerve, (-) ulnar nerve   TREATMENT DATE:   5/8: Prone rib mobs STM bilateral mid traps, rhomoids, upper trap, levator Discussed shoulder anatomy/symptoms Ktape for RC/deltoid support  5/6: -Butterfly stretch supine -Bridge x20 -90/90 march with tA  -R shoulder rhythmic stabilization -TPR to R scapular mm -quadruped cat/cow x10ea -qped alternating shoulder taps x10 -qped LE extensions -bird dog x5 -prone shoulder row 4# 2x10 R -prone hip extension with knee flexed 2x10ea -wall clocks RTB 2x5R -seated on physioball:  -marches 2x10  -LAQ 2x10ea Squats to plinth 2x10 Rhomboid stretch in doorway   5/1: see plan  Treatment                            4/29: Blank lines following charge title = not provided on this treatment date.   Manual:  TPDN No STM pecs, rhomboids, subscap, upper trap Theracane instruction There-ex: PROM R shoulder Serratus shoulder flexion with RTB 2x5 Bilateral ER RTB 2x10      PATIENT EDUCATION:  Education details: rationale for interventions, HEP  Person educated: Patient Education method: Explanation, Demonstration, Tactile cues, Verbal cues Education comprehension: verbalized understanding, returned demonstration, verbal cues required, tactile cues required, and needs further education     HOME EXERCISE PROGRAM: [id number] Hesch self  correction for Rt anterior innominate rotation   ASSESSMENT:  CLINICAL IMPRESSION: Increased symptoms in shoulder following pec stretch at door and crawling on elbows indicating biceps  involvement. Will keep appt with Dr Hermina Loosen on the 16th. Encouraged her to perform active pec stretch rather than passive in door for protection.   Eval: Patient is a 58 y.o. F who was seen today for physical therapy evaluation and treatment for Rt hip pain following an incident on skiis that resulted in strained ER through bilateral LEs. Multiple pelvic fractures and strain through adductors, history of lumbar spine surgical intervention all contributing to pain. Pt does have known tearing in hip labrum but is feeling much better since the injection and would like to avoid surgery if possible.  Pt was very active prior to the accident and would like to return to PLOF. Pt also notes Rt shoulder pain onset since the fall- notable postural changes through thoracic spine creating biomechanical chain input from pelvis to shoulder.     REHAB POTENTIAL: Good  CLINICAL DECISION MAKING: Stable/uncomplicated  EVALUATION COMPLEXITY: Low   GOALS: Goals reviewed with patient? Yes  SHORT TERM GOALS: Target date: 3/22  Able to ambulate household distances, without AD, without antalgic pattern Baseline: Goal status: IN PROGRESS (weaning off FWW to single crutch 3/21)  2.  Demo proper core control with exercises Baseline:  Goal status: IN PROGRESS 3/21    LONG TERM GOALS: Target date: POC date  Hip strength 90% of left side Baseline: MMT to be performed as appropriate Goal status: INITIAL  2.  Progressing into plyometric motions with minimal to no pain Baseline:  Goal status: INITIAL  3.  Return to work with understanding of exercises & rest breaks to reduce discomfort Baseline:  Goal status: IN PROGRESS  4.  Navigate uneven surfaces with minimal to no pain Baseline:  Goal status: INITIAL  5.  Pt  will be able to move R UE in all directions without pain for dressing and driving tasks Baseline:  Goal status: INITIAL  6.  Pt will demo at least 4/5 periscapular strength to demo increased shoulder stability and decreased nerve impingement Baseline:  Goal status: INITIAL     PLAN:  PT FREQUENCY: 1-2x/week  PT DURATION: 12 weeks  PLANNED INTERVENTIONS: [id number]- PT Re-evaluation, 97110-Therapeutic exercises, 97530- Therapeutic activity, 97112- Neuromuscular re-education, 97535- Self Care, [id number]- Manual therapy, [phone]- Gait training, [phone]- Aquatic Therapy, [phone]- Electrical stimulation (unattended), Patient/Family education, Balance training, Stair training, Taping, Dry Needling, Joint mobilization, Spinal mobilization, Cryotherapy, and Moist heat.  PLAN FOR NEXT SESSION: consider aquatics. Pt also interested in interventions looking at hamstring tightness, massage therapy. Strengthen rhomboids, mid traps, low traps; stretch pec minor/major; nerve glides/stretches  Tamari Busic C. Khamil Lamica PT, DPT 11/01/23 11:54 AM

## 2023-11-03 ENCOUNTER — Encounter (HOSPITAL_BASED_OUTPATIENT_CLINIC_OR_DEPARTMENT_OTHER): Admitting: Orthopaedic Surgery

## 2023-11-03 DIAGNOSIS — F4322 Adjustment disorder with anxiety: Secondary | ICD-10-CM | POA: Diagnosis not present

## 2023-11-04 ENCOUNTER — Encounter (HOSPITAL_BASED_OUTPATIENT_CLINIC_OR_DEPARTMENT_OTHER): Payer: Self-pay

## 2023-11-04 ENCOUNTER — Ambulatory Visit (HOSPITAL_BASED_OUTPATIENT_CLINIC_OR_DEPARTMENT_OTHER): Payer: Commercial Managed Care - PPO

## 2023-11-04 DIAGNOSIS — M25561 Pain in right knee: Secondary | ICD-10-CM

## 2023-11-04 DIAGNOSIS — M6281 Muscle weakness (generalized): Secondary | ICD-10-CM | POA: Diagnosis not present

## 2023-11-04 DIAGNOSIS — R262 Difficulty in walking, not elsewhere classified: Secondary | ICD-10-CM | POA: Diagnosis not present

## 2023-11-04 DIAGNOSIS — R293 Abnormal posture: Secondary | ICD-10-CM | POA: Diagnosis not present

## 2023-11-04 DIAGNOSIS — M25511 Pain in right shoulder: Secondary | ICD-10-CM | POA: Diagnosis not present

## 2023-11-04 DIAGNOSIS — M25551 Pain in right hip: Secondary | ICD-10-CM | POA: Diagnosis not present

## 2023-11-04 NOTE — Therapy (Signed)
 OUTPATIENT PHYSICAL THERAPY TREATMENT  Patient Name: Maria Evans MRN: [id number] DOB:[birth year], 58 y.o., female Today's Date: 11/04/2023  END OF SESSION:  PT End of Session - 11/04/23 1604     Visit Number 18    Number of Visits 25    Date for PT Re-Evaluation 11/15/23    Authorization Type [facility] [employer]    PT Start Time 1600    PT Stop Time 1645    PT Time Calculation (min) 45 min    Activity Tolerance Patient tolerated treatment well    Behavior During Therapy WFL for tasks assessed/performed                  Past Medical History:  Diagnosis Date   Anxiety    Anxiety with depression    Controlled.   Common migraine without aura    Diabetes mellitus type I, controlled (HCC)    Treated with 4 times daily insulin  based on blood sugar monitoring   Hip sprain    & contusion   Hyperlipidemia due to type 1 diabetes mellitus (HCC)    Currently on atorvastatin .   Hypothyroid    Lipoma    MVP (mitral valve prolapse) 2005   Renally diagnosed back in 2005 (during her pregnancy)_, most recently evaluated in 2012.  [facility], Dr. Joanne Muckle); 11/2010 TTE: Moderate MR, posteriorly directed (recommend TEE) => TEE (01/2011) - mild MVP (anterior leaflet) with Mild-Mod MR.;    Past Surgical History:  Procedure Laterality Date   LAPAROSCOPIC LYSIS OF ADHESIONS     LIPOMA EXCISION Left    LUMBAR MICRODISCECTOMY     TRANSESOPHAGEAL ECHOCARDIOGRAM  02/13/2011   [facility] -> Dr. Joanne Muckle [facility]): EF 55 to 60%.  Borderline LA dilation.  Prolapse of A2 scallop of mitral valve-mild MVP with mild to moderate MR.  (2 eccentric jets, posteriorly directed, c/w anterior leaflet pathology))   TRANSTHORACIC ECHOCARDIOGRAM  12/11/2010   [facility] -> Dr. Joanne Muckle [facility]): EF 35 to 6%.  Mild LA dilation.  Moderate MR-eccentric/possibly directed jet consistent with  antilipid pathology.  Mild TR, LP HTN, mild PR.  Recommend TEE.   TREADMILL STRESS ECHOCARDIOGRAM     [facility] -> Dr. Joanne Muckle [facility]): Exercised 10 minutes (11 minutes), achieved Max Heart Rate 162 = 92% Max Predicted Heart Rate, clinically and electrocardiographically negative.  Nonspecific ST and T wave changes.  Prestress echo showed EF 55 to 60% with normal wall motion.  Post-Stress: EF 65 to 70% with nl augmentation of all walls.=> NORMAL STRESS [facility]   Patient Active Problem List   Diagnosis Date Noted   Osteoporosis 09/22/2023   Migraine headache 09/22/2023   Hypothyroidism 11/09/2021   Type 1 diabetes mellitus with hyperglycemia (HCC) 08/22/2020   History of mitral valve prolapse in adulthood 08/23/2010     REFERRING PROVIDER:  Wilhelmenia Harada, MD     REFERRING DIAG: M25.351 (ICD-10-CM) - Hip instability, right   Rationale for Evaluation and Treatment: Rehabilitation  THERAPY DIAG:  Difficulty in walking, not elsewhere classified  Abnormal posture  Pain in right hip  Muscle weakness (generalized)  Pain in joint of right knee  Acute pain of right shoulder  ONSET DATE: 07/21/23   SUBJECTIVE:  SUBJECTIVE STATEMENT: Pt reports ongoing shoulder pain with crawling on bed on elbows. "I think I overdid it today when unloading groceries today." Lower back as bene hurting since.   ERO: New referral for her shoulder pain is in today. In regards to her shoulder, she feels she has some underlying R shoulder arthritis with history of bursitis in her L shoulder. Thinks she may have had a touch of bursitis on her R shoulder even prior to her fall. During the fall, she did land on her R shoulder and overused it with using the crutches which greatly irritated  it. Did get steroid shot in shoulder 08/11/23. Still gets issues with putting on her shirt -- gets a shooting pain. Most bothered with driving and her arm will start hurting. Pt is right hand dominant so using it at work also makes it fatigued and sore. Feels that the pain is more generalized from the medial arm which can radiate down the forearms.    Eval: T1DM and maintain a good AI1. 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 I like to go sailing and skiing, enjoys trail walking around [facility]. Has 4 dogs and husband is an avid exerciser. I have had multiple LE fx but have been trauma-related. Bilat 5th mets, Rt ankle fx 2 locations. Has had back surgery at L4-5 discectomy. Jan 27th went on a family trip- skiing on new equipment- fell back, more on right side with bruise on Rt shoulder. I think the back of my skiis crossed and opened anteriorly and knees splayed out to strain groin muscles. Bindings did not release in fall. Hauled out by ski patrol and was in [facility]- ER xrays denied fx and sent me out on crutches.  After a week or 2 was not improving and tailbone felt like it was on fire. MRI showed bilat fractures, including sacrum. Did not miss a day at work and was unable to tolerate pain on walker. Developed tendonitis in right shoulder walking around the hospital on crutches. The 17th of Feb, Rt shoulder steroid injection. Discovered hip instability when I stepped into the tub-shower with right foot and felt something shift. When I rolled back to put my socks on, both hips shifted/popped/caught and had 10/10 pain and spent the day in bed. Dr B gave me a steroid shot in right hip on Monday and have had dramatic improvement. Was able to tolerate laying on her side for about 15 min last night. Walked short distances at home without RW.   PERTINENT HISTORY:  H/o ankle fx without surgery. T1DM Lt shoulder bursitis with PT in the past Injection to Rt shoulder recently for tendonitis   PAIN:  Are you having pain?  Yes: NPRS scale: no pain at present, tightness in hamstrings Pain location: Rt hip, tailbone, Lateral Lt thigh sensation Pain description: I feel a sensation down lateral Lt thigh Aggravating factors: walking Relieving factors: reduced WB, injection  PRECAUTIONS:  None  RED FLAGS: None   WEIGHT BEARING RESTRICTIONS:  WBAT  FALLS:  Has patient fallen in last 6 months? Yes. 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  DIAGNOSTIC FINDINGS:  DG hip 08/14/23: Fractures of the bilateral puboacetabular junction, inferior pubic rami and right sacrum a MRI are not well-defined by radiograph. The right inferior ramus fracture is potentially visualized, the other fractures  are radiographically occult. There is no hip dislocation. No pubic symphyseal or sacroiliac diastasis.  PATIENT SURVEYS:  LEFS 18 09/25/23: 53/80   COGNITIVE STATUS: Within functional limits for tasks assessed   SENSATION: WFL  POSTURE:  Standing: Rt shoulder depression, decreased thoracic kyphosis & lumbar lordosis, notable pelvic rotation Supine: Lt leg functionally shorter, Rt anterior innom  HAND DOMINANCE:  Right  GAIT: Eval: RW, slow cadence, short stride  UPPER EXTREMITY ROM:   Active ROM Right 10/07/23 Left 10/07/23  Shoulder flexion 165 158  Shoulder extension    Shoulder abduction 160 Wants to help bringing her arm down 160  Shoulder adduction    Shoulder extension    Shoulder internal rotation T7 with pain T4  Shoulder external rotation 45 with pain 54  Elbow flexion    Elbow extension    Wrist flexion    Wrist extension    Wrist ulnar deviation    Wrist radial deviation    Wrist pronation    Wrist supination     (Blank rows = not tested)  UPPER EXTREMITY MMT:  MMT Right 10/07/23 Left 10/07/23  Shoulder  flexion 5 5  Shoulder extension    Shoulder abduction 5 with pain 5  Shoulder adduction    Shoulder extension    Shoulder internal rotation 5 5  Shoulder external rotation 5 5  Middle trapezius in prone 3+ with pain   Lower trapezius in prone 3- with pain   Elbow flexion    Elbow extension    Wrist flexion    Wrist extension    Wrist ulnar deviation    Wrist radial deviation    Wrist pronation    Wrist supination    Grip strength     (Blank rows = not tested)  SPECIAL TESTS:  Roo's test (+) within 10 sec  Painful arc (-) but painful on eccentric control with bringing arm down  Rotator cuff testing otherwise (-)   ULTT (+) median nerve especially, slightly with radial nerve, (-) ulnar nerve   TREATMENT DATE:   5/13: -Butterfly stretch supine -Bridge x20 -90/90 march with tA  -quadruped cat/cow to childs pose x10ea -sidelying hip circles x10ea/bil -prone shoulder row 4# 2x10 R -staggered STS x10ea -sumo squats with 10lb KB 2x10 -lunges x5ea (holding onto back of bike)    5/8: Prone rib mobs STM bilateral mid traps, rhomoids, upper trap, levator Discussed shoulder anatomy/symptoms Ktape for RC/deltoid support  5/6: -Butterfly stretch supine -Bridge x20 -90/90 march with tA  -R shoulder rhythmic stabilization -TPR to R scapular mm -quadruped cat/cow x10ea -qped alternating shoulder taps x10 -qped LE extensions -bird dog x5 -prone shoulder row 4# 2x10 R -prone hip extension with knee flexed 2x10ea -wall clocks RTB 2x5R -seated on physioball:  -marches 2x10  -LAQ 2x10ea Squats to plinth 2x10 Rhomboid stretch in doorway   5/1: see plan  Treatment                            4/29: Blank lines following charge title = not provided on this treatment date.   Manual:  TPDN No STM pecs, rhomboids, subscap, upper trap Theracane instruction There-ex: PROM R shoulder Serratus shoulder flexion with RTB 2x5 Bilateral ER RTB 2x10      PATIENT  EDUCATION:  Education details: rationale for interventions, HEP  Person educated: Patient Education method: Explanation, Demonstration, Tactile cues, Verbal cues Education comprehension: verbalized understanding, returned demonstration, verbal cues required, tactile cues  required, and needs further education     HOME EXERCISE PROGRAM: [id number] Hesch self correction for Rt anterior innominate rotation   ASSESSMENT:  CLINICAL IMPRESSION: Good response following lumbar stretching in beginning of session. Denied low back pain with exercises today. Pt challenged by sidelying hip circles today due to weakness. Also progressed to standard lunges with good tolerance, though poor endurance noted. Added to HEP for pt to perform at home. Pt feels ready to maybe attend some fitness classes at [employer] soon. She was instructed to avoid activities at home/work that aggravate her R shoulder. Pt sees Dr. Hermina Loosen on Friday regarding R shoulder. Will continue to progress as tolerated.   Eval: Patient is a 58 y.o. F who was seen today for physical therapy evaluation and treatment for Rt hip pain following an incident on skiis that resulted in strained ER through bilateral LEs. Multiple pelvic fractures and strain through adductors, history of lumbar spine surgical intervention all contributing to pain. Pt does have known tearing in hip labrum but is feeling much better since the injection and would like to avoid surgery if possible.  Pt was very active prior to the accident and would like to return to PLOF. Pt also notes Rt shoulder pain onset since the fall- notable postural changes through thoracic spine creating biomechanical chain input from pelvis to shoulder.     REHAB POTENTIAL: Good  CLINICAL DECISION MAKING: Stable/uncomplicated  EVALUATION COMPLEXITY: Low   GOALS: Goals reviewed with patient? Yes  SHORT TERM GOALS: Target date: 3/22  Able to ambulate household distances, without AD, without  antalgic pattern Baseline: Goal status: IN PROGRESS (weaning off FWW to single crutch 3/21)  2.  Demo proper core control with exercises Baseline:  Goal status: IN PROGRESS 3/21    LONG TERM GOALS: Target date: POC date  Hip strength 90% of left side Baseline: MMT to be performed as appropriate Goal status: INITIAL  2.  Progressing into plyometric motions with minimal to no pain Baseline:  Goal status: INITIAL  3.  Return to work with understanding of exercises & rest breaks to reduce discomfort Baseline:  Goal status: IN PROGRESS  4.  Navigate uneven surfaces with minimal to no pain Baseline:  Goal status: INITIAL  5.  Pt will be able to move R UE in all directions without pain for dressing and driving tasks Baseline:  Goal status: INITIAL  6.  Pt will demo at least 4/5 periscapular strength to demo increased shoulder stability and decreased nerve impingement Baseline:  Goal status: INITIAL     PLAN:  PT FREQUENCY: 1-2x/week  PT DURATION: 12 weeks  PLANNED INTERVENTIONS: [id number]- PT Re-evaluation, 97110-Therapeutic exercises, 97530- Therapeutic activity, 97112- Neuromuscular re-education, 97535- Self Care, 54098- Manual therapy, [phone]- Gait training, [phone]- Aquatic Therapy, [phone]- Electrical stimulation (unattended), Patient/Family education, Balance training, Stair training, Taping, Dry Needling, Joint mobilization, Spinal mobilization, Cryotherapy, and Moist heat.  PLAN FOR NEXT SESSION: consider aquatics. Pt also interested in interventions looking at hamstring tightness, massage therapy. Strengthen rhomboids, mid traps, low traps; stretch pec minor/major; nerve glides/stretches  Herb Loges, PTA  11/04/23 5:02 PM

## 2023-11-06 ENCOUNTER — Encounter (HOSPITAL_BASED_OUTPATIENT_CLINIC_OR_DEPARTMENT_OTHER): Payer: Self-pay | Admitting: Physical Therapy

## 2023-11-06 ENCOUNTER — Ambulatory Visit (HOSPITAL_BASED_OUTPATIENT_CLINIC_OR_DEPARTMENT_OTHER): Payer: Commercial Managed Care - PPO | Admitting: Physical Therapy

## 2023-11-06 DIAGNOSIS — M6281 Muscle weakness (generalized): Secondary | ICD-10-CM | POA: Diagnosis not present

## 2023-11-06 DIAGNOSIS — M25511 Pain in right shoulder: Secondary | ICD-10-CM

## 2023-11-06 DIAGNOSIS — R293 Abnormal posture: Secondary | ICD-10-CM

## 2023-11-06 DIAGNOSIS — R262 Difficulty in walking, not elsewhere classified: Secondary | ICD-10-CM | POA: Diagnosis not present

## 2023-11-06 DIAGNOSIS — M25551 Pain in right hip: Secondary | ICD-10-CM

## 2023-11-06 DIAGNOSIS — M25561 Pain in right knee: Secondary | ICD-10-CM | POA: Diagnosis not present

## 2023-11-06 NOTE — Therapy (Signed)
 OUTPATIENT PHYSICAL THERAPY TREATMENT  Patient Name: Maria Evans MRN: [id number] DOB:07-11-1965, 58 y.o., female Today's Date: 11/06/2023  END OF SESSION:  PT End of Session - 11/06/23 1603     Visit Number 19    Number of Visits 35    Date for PT Re-Evaluation 01/24/24    Authorization Type [facility] [employer]    PT Start Time 1601    PT Stop Time 1643    PT Time Calculation (min) 42 min    Activity Tolerance Patient tolerated treatment well    Behavior During Therapy WFL for tasks assessed/performed                   Past Medical History:  Diagnosis Date   Anxiety    Anxiety with depression    Controlled.   Common migraine without aura    Diabetes mellitus type I, controlled (HCC)    Treated with 4 times daily insulin  based on blood sugar monitoring   Hip sprain    & contusion   Hyperlipidemia due to type 1 diabetes mellitus (HCC)    Currently on atorvastatin .   Hypothyroid    Lipoma    MVP (mitral valve prolapse) 2005   Renally diagnosed back in 2005 (during her pregnancy)_, most recently evaluated in 2012.  [facility] - Cardiology, Dr. Joanne Muckle); 11/2010 TTE: Moderate MR, posteriorly directed (recommend TEE) => TEE (01/2011) - mild MVP (anterior leaflet) with Mild-Mod MR.;    Past Surgical History:  Procedure Laterality Date   LAPAROSCOPIC LYSIS OF ADHESIONS     LIPOMA EXCISION Left    LUMBAR MICRODISCECTOMY     TRANSESOPHAGEAL ECHOCARDIOGRAM  02/13/2011   [place] -> Dr. Joanne Muckle [facility]-Cardiology): EF 55 to 60%.  Borderline LA dilation.  Prolapse of A2 scallop of mitral valve-mild MVP with mild to moderate MR.  (2 eccentric jets, posteriorly directed, c/w anterior leaflet pathology))   TRANSTHORACIC ECHOCARDIOGRAM  12/11/2010   [facility] -> Dr. Joanne Muckle [facility]-Cardiology): EF 35 to 6%.  Mild LA dilation.  Moderate MR-eccentric/possibly directed jet consistent with  antilipid pathology.  Mild TR, LP HTN, mild PR.  Recommend TEE.   TREADMILL STRESS ECHOCARDIOGRAM     [facility] -> Dr. Joanne Muckle [facility] Phys[facility]-Cardiology): Exercised 10 minutes (11 minutes), achieved Max Heart Rate 162 = 92% Max Predicted Heart Rate, clinically and electrocardiographically negative.  Nonspecific ST and T wave changes.  Prestress echo showed EF 55 to 60% with normal wall motion.  Post-Stress: EF 65 to 70% with nl augmentation of all walls.=> NORMAL STRESS [facility]   Patient Active Problem List   Diagnosis Date Noted   Osteoporosis 09/22/2023   Migraine headache 09/22/2023   Hypothyroidism 11/09/2021   Type 1 diabetes mellitus with hyperglycemia (HCC) 08/22/2020   History of mitral valve prolapse in adulthood 08/23/2010     REFERRING PROVIDER:  Wilhelmenia Harada, MD     REFERRING DIAG: M25.351 (ICD-10-CM) - Hip instability, right   Rationale for Evaluation and Treatment: Rehabilitation  THERAPY DIAG:  Difficulty in walking, not elsewhere classified - Plan: PT plan of care cert/re-cert  Abnormal posture - Plan: PT plan of care cert/re-cert  Pain in right hip - Plan: PT plan of care cert/re-cert  Muscle weakness (generalized) - Plan: PT plan of care cert/re-cert  Pain in joint of right knee - Plan: PT plan of care cert/re-cert  Acute pain of right shoulder - Plan: PT plan of care cert/re-cert  ONSET  DATE: 07/21/23   SUBJECTIVE:                                                                                                                                                                                           SUBJECTIVE STATEMENT: I was sore after last session. Made it to the 3rd floor on the stairs. Did not reach for the tylenol  today.   ERO: New referral for her shoulder pain is in today. In regards to her shoulder, she feels she has some underlying R shoulder arthritis with history of bursitis in her L shoulder. Thinks she may have had a  touch of bursitis on her R shoulder even prior to her fall. During the fall, she did land on her R shoulder and overused it with using the crutches which greatly irritated it. Did get steroid shot in shoulder 08/11/23. Still gets issues with putting on her shirt -- gets a shooting pain. Most bothered with driving and her arm will start hurting. Pt is right hand dominant so using it at work also makes it fatigued and sore. Feels that the pain is more generalized from the medial arm which can radiate down the forearms.    Eval: T1DM and maintain a good AI1. 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 I like to go sailing and skiing, enjoys trail walking around [facility]. Has 4 dogs and husband is an avid exerciser. I have had multiple LE fx but have been trauma-related. Bilat 5th mets, Rt ankle fx 2 locations. Has had back surgery at L4-5 discectomy. Jan 27th went on a family trip- skiing on new equipment- fell back, more on right side with bruise on Rt shoulder. I think the back of my skiis crossed and opened anteriorly and knees splayed out to strain groin muscles. Bindings did not release in fall. Hauled out by ski patrol and was in [facility]- ER xrays denied fx and sent me out on crutches.  After a week or 2 was not improving and tailbone felt like it was on fire. MRI showed bilat fractures, including sacrum. Did not miss a day at work and was unable to tolerate pain on walker. Developed tendonitis in right shoulder walking around the hospital on crutches. The 17th of Feb, Rt shoulder steroid injection. Discovered hip instability when I stepped into the tub-shower with right foot and felt something shift. When I rolled back to put my socks on, both hips shifted/popped/caught and had 10/10 pain and spent the day in bed. Dr B gave me a steroid shot in right hip on Monday and have had dramatic improvement. Was able to tolerate laying on her side  for about 15 min last night. Walked short distances at home without RW.    PERTINENT HISTORY:  H/o ankle fx without surgery. T1DM Lt shoulder bursitis with PT in the past Injection to Rt shoulder recently for tendonitis   PAIN:  Are you having pain? Yes: NPRS scale: no pain at present, tightness in hamstrings Pain location: Rt hip, tailbone, Lateral Lt thigh sensation Pain description: I feel a sensation down lateral Lt thigh Aggravating factors: walking Relieving factors: reduced WB, injection  PRECAUTIONS:  None  RED FLAGS: None   WEIGHT BEARING RESTRICTIONS:  WBAT  FALLS:  Has patient fallen in last 6 months? Yes. 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  DIAGNOSTIC FINDINGS:  DG hip 08/14/23: Fractures of the bilateral puboacetabular junction, inferior pubic rami and right sacrum a MRI are not well-defined by radiograph. The right inferior ramus fracture is potentially visualized, the other fractures are radiographically occult. There is no hip dislocation. No pubic symphyseal or sacroiliac diastasis.  PATIENT SURVEYS:  LEFS 18 09/25/23: 53/80  5/15: 57/80  COGNITIVE STATUS: Within functional limits for tasks assessed   SENSATION: WFL  POSTURE:  Standing: Rt shoulder depression, decreased thoracic kyphosis & lumbar lordosis, notable pelvic rotation Supine: Lt leg functionally shorter, Rt anterior innom  HAND DOMINANCE:  Right  GAIT: Eval: RW, slow cadence, short stride  UPPER EXTREMITY ROM:   Active ROM Right 10/07/23 Left 10/07/23  Shoulder flexion 165 158  Shoulder extension    Shoulder abduction 160 Wants to help bringing her arm down 160  Shoulder adduction    Shoulder extension    Shoulder internal rotation T7 with pain T4  Shoulder external rotation 45 with pain 54   (Blank rows = not tested)  UPPER EXTREMITY MMT:  MMT Right 10/07/23  Left 10/07/23  Shoulder flexion 5 5  Shoulder extension    Shoulder abduction 5 with pain 5  Shoulder adduction    Shoulder extension    Shoulder internal rotation 5 5  Shoulder external rotation 5 5  Middle trapezius in prone 3+ with pain   Lower trapezius in prone 3- with pain    (Blank rows = not tested)  MMT (lb) Right 5/15 Left 5/15  Hip flexion    Hip extension    Hip abduction- sidelying 11.6 19.4  Hip adduction    Hip internal rotation    Hip external rotation    Knee flexion    Knee extension 50.0 52.4   (Blank rows = not tested)   TREATMENT DATE:   5/15: Muscle testing & LEFS Lateral step up 6" Single leg stance and with squat- red tband at knees, chair for single UE support Wall squat with mini clam red tband Squat to heel raise  5/13: -Butterfly stretch supine -Bridge x20 -90/90 march with tA  -quadruped cat/cow to childs pose x10ea -sidelying hip circles x10ea/bil -prone shoulder row 4# 2x10 R -staggered STS x10ea -sumo squats with 10lb KB 2x10 -lunges x5ea (holding onto back of bike)    5/8: Prone rib mobs STM bilateral mid traps, rhomoids, upper trap, levator Discussed shoulder anatomy/symptoms Ktape for RC/deltoid support    PATIENT EDUCATION:  Education details: rationale for interventions, HEP  Person educated: Patient Education method: Explanation, Demonstration, Tactile cues, Verbal cues Education comprehension: verbalized understanding, returned demonstration, verbal cues required,  tactile cues required, and needs further education     HOME EXERCISE PROGRAM: [id number] Hesch self correction for Rt anterior innominate rotation   ASSESSMENT:  CLINICAL IMPRESSION: Pt continues to make progress in functional strength for hip but is lacking necessary strength for PLOF. Extending POC in order to properly progress strength while avoiding injury.   Eval: Patient is a 58 y.o. F who was seen today for physical therapy evaluation and  treatment for Rt hip pain following an incident on skiis that resulted in strained ER through bilateral LEs. Multiple pelvic fractures and strain through adductors, history of lumbar spine surgical intervention all contributing to pain. Pt does have known tearing in hip labrum but is feeling much better since the injection and would like to avoid surgery if possible.  Pt was very active prior to the accident and would like to return to PLOF. Pt also notes Rt shoulder pain onset since the fall- notable postural changes through thoracic spine creating biomechanical chain input from pelvis to shoulder.     REHAB POTENTIAL: Good  CLINICAL DECISION MAKING: Stable/uncomplicated  EVALUATION COMPLEXITY: Low   GOALS: Goals reviewed with patient? Yes  SHORT TERM GOALS: Target date: 3/22  Able to ambulate household distances, without AD, without antalgic pattern Baseline: Goal status: MET  2.  Demo proper core control with exercises Baseline:  Goal status: MET    LONG TERM GOALS: Target date: POC date  Hip strength 90% of left side Baseline: MMT to be performed as appropriate Goal status: ongoing  2.  Progressing into plyometric motions with minimal to no pain Baseline:  Goal status: ongoing  3.  Return to work with understanding of exercises & rest breaks to reduce discomfort Baseline:  Goal status: ongoing- requires further progression  4.  Navigate uneven surfaces with minimal to no pain Baseline:  Goal status: partially met- able to ride boat with fatigue but no pain. Walked the trails at the healing gardens at [employer]  5.  Pt will be able to move R UE in all directions without pain for dressing and driving tasks Baseline:  Goal status: ongoing- f/u with MD   6.  Pt will demo at least 4/5 periscapular strength to demo increased shoulder stability and decreased nerve impingement Baseline:  Goal status: partially met- good strength but shoulder flares up quickly.       PLAN:  PT FREQUENCY: 1-2x/week  PT DURATION: 12 weeks  PLANNED INTERVENTIONS: 97164- PT Re-evaluation, 97110-Therapeutic exercises, 97530- Therapeutic activity, 97112- Neuromuscular re-education, 97535- Self Care, 62130- Manual therapy, [phone]- Gait training, [phone]- Aquatic Therapy, [phone]- Electrical stimulation (unattended), Patient/Family education, Balance training, Stair training, Taping, Dry Needling, Joint mobilization, Spinal mobilization, Cryotherapy, and Moist heat.  PLAN FOR NEXT SESSION: consider aquatics. Pt also interested in interventions looking at hamstring tightness, massage therapy. Strengthen rhomboids, mid traps, low traps; stretch pec minor/major; nerve glides/stretches  Courtany Mcmurphy C. Eloina Ergle PT, DPT 11/06/23 4:55 PM

## 2023-11-07 ENCOUNTER — Ambulatory Visit (HOSPITAL_BASED_OUTPATIENT_CLINIC_OR_DEPARTMENT_OTHER): Admitting: Orthopaedic Surgery

## 2023-11-07 DIAGNOSIS — M25511 Pain in right shoulder: Secondary | ICD-10-CM | POA: Diagnosis not present

## 2023-11-07 NOTE — Progress Notes (Signed)
 Chief Complaint: Right shoulder pain     History of Present Illness:   11/07/2023: Presents today for follow-up of her right shoulder.  She is having a hard time with overhead motion with radiating pain laterally.  She is having a hard time laying on the side.    PMH/PSH/Family History/Social History/Meds/Allergies:    Past Medical History:  Diagnosis Date   Anxiety    Anxiety with depression    Controlled.   Common migraine without aura    Diabetes mellitus type I, controlled (HCC)    Treated with 4 times daily insulin  based on blood sugar monitoring   Hip sprain    & contusion   Hyperlipidemia due to type 1 diabetes mellitus (HCC)    Currently on atorvastatin .   Hypothyroid    Lipoma    MVP (mitral valve prolapse) 2005   Renally diagnosed back in 2005 (during her pregnancy)_, most recently evaluated in 2012.  [facility] - Cardiology, Dr. Joanne Muckle); 11/2010 TTE: Moderate MR, posteriorly directed (recommend TEE) => TEE (01/2011) - mild MVP (anterior leaflet) with Mild-Mod MR.;    Past Surgical History:  Procedure Laterality Date   LAPAROSCOPIC LYSIS OF ADHESIONS     LIPOMA EXCISION Left    LUMBAR MICRODISCECTOMY     TRANSESOPHAGEAL ECHOCARDIOGRAM  02/13/2011   [facility] -> Dr. Joanne Muckle [facility] Physicians-Cardiology): EF 55 to 60%.  Borderline LA dilation.  Prolapse of A2 scallop of mitral valve-mild MVP with mild to moderate MR.  (2 eccentric jets, posteriorly directed, c/w anterior leaflet pathology))   TRANSTHORACIC ECHOCARDIOGRAM  12/11/2010   [facility] -> Dr. Joanne Muckle [facility] Phys[facility]-Cardiology): EF 35 to 6%.  Mild LA dilation.  Moderate MR-eccentric/possibly directed jet consistent with antilipid pathology.  Mild TR, LP HTN, mild PR.  Recommend TEE.   TREADMILL STRESS ECHOCARDIOGRAM     [facility] -> Dr. Joanne Muckle [facility] Physicians-Cardiology): Exercised 10 minutes (11  minutes), achieved Max Heart Rate 162 = 92% Max Predicted Heart Rate, clinically and electrocardiographically negative.  Nonspecific ST and T wave changes.  Prestress echo showed EF 55 to 60% with normal wall motion.  Post-Stress: EF 65 to 70% with nl augmentation of all walls.=> NORMAL STRESS [facility]   Social History   Socioeconomic History   Marital status: Married    Spouse name: Not on file   Number of children: 1   Years of education: Not on file   Highest education level: Not on file  Occupational History   Occupation: Hospital pharmacist     Employer: [employer]  Tobacco Use   Smoking status: Never   Smokeless tobacco: Never  Vaping Use   Vaping status: Never Used  Substance and Sexual Activity   Alcohol use: Yes    Comment: occ, wine or beer   Drug use: Never   Sexual activity: Yes    Partners: Male    Birth control/protection: Post-menopausal  Other Topics Concern   Not on file  Social History Narrative   She is currently divorced mother of 67 (56 year old son).   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    She is now with her longtime high school significant other after completing the divorce from her first husband.     She and her fianc intend to elope probably in May or June  of this year.      She is an avid exerciser.  Enjoys jogging and walking.  She does not list 2 miles with treadmill and walks about 45 minutes most days - either indoors on the treadmill or outside.         Social Drivers of Corporate investment banker Strain: Not on file  Food Insecurity: Not on file  Transportation Needs: Not on file  Physical Activity: Not on file  Stress: Not on file  Social Connections: Not on file   Family History  Problem Relation Age of Onset   Hyperlipidemia Mother    Hyperlipidemia Father    Heart Problems Father     Diabetes Brother    Esophageal cancer Maternal Grandfather        Long-term smoker   CAD Paternal Grandmother        Long-term smoker   Heart attack Paternal Grandmother 28       Cause of death   Stroke Paternal Grandmother    Allergies  Allergen Reactions   Nitrofurantoin Itching and Other (See Comments)   Other     Squash- hives, itching   Current Outpatient Medications  Medication Sig Dispense Refill   Acetaminophen  (TYLENOL  PO) Take by mouth as needed.     ALPRAZolam  (XANAX ) 0.5 MG tablet Take 1 tablet (0.5 mg total) by mouth daily as needed. 30 tablet 0   ALPRAZolam  (XANAX ) 0.5 MG tablet Take 1 tablet (0.5 mg total) by mouth daily as needed. 30 tablet 0   atorvastatin  (LIPITOR) 20 MG tablet Take 1 tablet (20 mg total) by mouth every other day. 90 tablet 3   Blood Pressure Monitoring (OMRON 3 SERIES BP MONITOR) DEVI Use as directed 1 each 0   Continuous Blood Gluc Receiver (DEXCOM G6 RECEIVER) DEVI Use to check blood sugar 4 times daily 1 each 0   Continuous Glucose Sensor (DEXCOM G6 SENSOR) MISC USE WITH DEXCOM TO CHECK BLOOD SUGAR 4 TIMES DAILY 9 each 3   Continuous Glucose Transmitter (DEXCOM G6 TRANSMITTER) MISC Use as instructed to check blood sugar change every 90 days 1 each 3   cyclobenzaprine  (FLEXERIL ) 10 MG tablet Take 1 tablet (10 mg total) by mouth at bedtime as needed. 60 tablet 1   escitalopram  (LEXAPRO ) 20 MG tablet Take 1 tablet (20 mg total) by mouth daily. 90 tablet 0   fluocinonide  gel (LIDEX ) 0.05 % Apply Externally Twice a day as needed for flare ups 30 g 1   Glucagon  3 MG/DOSE POWD Place 3 mg into the nose once as needed for up to 1 dose. 1 each 11   glucose blood (FREESTYLE TEST STRIPS) test strip Use as instructed 1-2 times a day 100 each 12   GVOKE HYPOPEN  1-PACK 1 MG/0.2ML SOAJ Inject 0.2 mLs into the skin as needed. 0.2 mL PRN   insulin  degludec (TRESIBA  FLEXTOUCH) 100 UNIT/ML FlexTouch Pen Inject up to 13 Units into the skin daily. (Patient taking  differently: Inject into the skin daily. 11-13 units) 9 mL 3   Insulin  Lispro-aabc (LYUMJEV  KWIKPEN) 100 UNIT/ML KwikPen Inject up to 8 units before each meal 3-4 times a day (Patient taking differently: Inject up to 11 units before each meal 3-4 times a day) 15 mL 11   Insulin  NPH, Human,, Isophane, (HUMULIN  N KWIKPEN) 100 UNIT/ML Kiwkpen Inject 2-4 Units into the skin daily in the afternoon. 15 mL 11   Insulin  Pen Needle (INSUPEN PEN NEEDLES) 32G X 4 MM MISC Use as  directed 4-6 times daily 300 each 3   levocetirizine (XYZAL ) 5 MG tablet Take 1 tablet (5 mg total) by mouth every evening. 90 tablet 3   levothyroxine  (SYNTHROID ) 100 MCG tablet Take 1 tablet (100 mcg total) by mouth daily before breakfast. 90 tablet 3   lisinopril  (ZESTRIL ) 2.5 MG tablet Take 1 tablet (2.5 mg total) by mouth daily. 90 tablet 0   meloxicam  (MOBIC ) 15 MG tablet Take 1 tablet (15 mg total) by mouth daily with breakfast for 14 days, THEN 1 tablet (15 mg total) daily as needed. (Patient not taking: Reported on 09/22/2023) 30 tablet 1   Multiple Vitamin (MULTIVITAMIN) capsule Take 1 capsule by mouth daily.     oxyCODONE -acetaminophen  (PERCOCET/ROXICET) 5-325 MG tablet Take 1 tablet by mouth every 8 (eight) hours as needed for severe pain (pain score 7-10). 15 tablet 0   rizatriptan  (MAXALT -MLT) 10 MG disintegrating tablet Take 1 tablet (10 mg total) by mouth as needed for migraine. May repeat once in 2 hours if needed. 10 tablet 3   traMADol  (ULTRAM ) 50 MG tablet Take 1 tablet (50 mg total) by mouth every 8 (eight) hours as needed for severe pain (pain score 7-10). 15 tablet 0   valACYclovir (VALTREX) 1000 MG tablet Take 1,000 mg by mouth as needed (fever blisters).     No current facility-administered medications for this visit.   No results found.  Review of Systems:   A ROS was performed including pertinent positives and negatives as documented in the HPI.  Physical Exam :   Constitutional: NAD and appears stated  age Neurological: Alert and oriented Psych: Appropriate affect and cooperative Last menstrual period 02/07/2016.   Comprehensive Musculoskeletal Exam:    Right shoulder with lateral deltoid pain.  Positive Neer impingement.  Forward elevation is to 170 degrees bilaterally.  Imaging:   Xray (3 views right shoulder): Normal   I personally reviewed and interpreted the radiographs.   Assessment and Plan:   58 y.o. female with right shoulder pain consistent with possible rotator cuff impingement.  At this time given the fact that she has tried 6 weeks of physical therapy and is hoping to and avoid injection I do believe an MRI is needed in order to further assess her shoulder.  Will plan to proceed with this and I will see her back following the results.  -Right shoulder MRI ordered   I personally saw and evaluated the patient, and participated in the management and treatment plan.  Maria Harada, MD Attending Physician, Orthopedic Surgery  This document was dictated using Dragon voice recognition software. A reasonable attempt at proof reading has been made to minimize errors.

## 2023-11-10 ENCOUNTER — Encounter (HOSPITAL_BASED_OUTPATIENT_CLINIC_OR_DEPARTMENT_OTHER): Payer: Self-pay | Admitting: Orthopaedic Surgery

## 2023-11-11 ENCOUNTER — Ambulatory Visit (HOSPITAL_BASED_OUTPATIENT_CLINIC_OR_DEPARTMENT_OTHER): Payer: Commercial Managed Care - PPO

## 2023-11-11 ENCOUNTER — Encounter (HOSPITAL_BASED_OUTPATIENT_CLINIC_OR_DEPARTMENT_OTHER): Payer: Self-pay

## 2023-11-12 ENCOUNTER — Encounter (HOSPITAL_BASED_OUTPATIENT_CLINIC_OR_DEPARTMENT_OTHER): Admitting: Orthopaedic Surgery

## 2023-11-13 ENCOUNTER — Ambulatory Visit (HOSPITAL_BASED_OUTPATIENT_CLINIC_OR_DEPARTMENT_OTHER): Payer: Commercial Managed Care - PPO | Admitting: Physical Therapy

## 2023-11-13 ENCOUNTER — Encounter (HOSPITAL_BASED_OUTPATIENT_CLINIC_OR_DEPARTMENT_OTHER): Payer: Self-pay | Admitting: Physical Therapy

## 2023-11-13 DIAGNOSIS — M25551 Pain in right hip: Secondary | ICD-10-CM | POA: Diagnosis not present

## 2023-11-13 DIAGNOSIS — R262 Difficulty in walking, not elsewhere classified: Secondary | ICD-10-CM | POA: Diagnosis not present

## 2023-11-13 DIAGNOSIS — R293 Abnormal posture: Secondary | ICD-10-CM

## 2023-11-13 DIAGNOSIS — M25511 Pain in right shoulder: Secondary | ICD-10-CM | POA: Diagnosis not present

## 2023-11-13 DIAGNOSIS — M6281 Muscle weakness (generalized): Secondary | ICD-10-CM | POA: Diagnosis not present

## 2023-11-13 DIAGNOSIS — M25561 Pain in right knee: Secondary | ICD-10-CM | POA: Diagnosis not present

## 2023-11-13 NOTE — Therapy (Signed)
 OUTPATIENT PHYSICAL THERAPY TREATMENT  Patient Name: Maria Evans MRN: [id number] DOB:10-24-1965, 58 y.o., female Today's Date: 11/13/2023  END OF SESSION:  PT End of Session - 11/13/23 1602     Visit Number 20    Number of Visits 35    Date for PT Re-Evaluation 01/24/24    Authorization Type [facility] [employer]    PT Start Time 1601    PT Stop Time 1646    PT Time Calculation (min) 45 min    Activity Tolerance Patient tolerated treatment well    Behavior During Therapy WFL for tasks assessed/performed                   Past Medical History:  Diagnosis Date   Anxiety    Anxiety with depression    Controlled.   Common migraine without aura    Diabetes mellitus type I, controlled (HCC)    Treated with 4 times daily insulin  based on blood sugar monitoring   Hip sprain    & contusion   Hyperlipidemia due to type 1 diabetes mellitus (HCC)    Currently on atorvastatin .   Hypothyroid    Lipoma    MVP (mitral valve prolapse) 2005   Renally diagnosed back in 2005 (during her pregnancy)_, most recently evaluated in 2012.  [facility] Card[facility], Dr. Joanne Muckle); 11/2010 TTE: Moderate MR, posteriorly directed (recommend TEE) => TEE (01/2011) - mild MVP (anterior leaflet) with Mild-Mod MR.;    Past Surgical History:  Procedure Laterality Date   LAPAROSCOPIC LYSIS OF ADHESIONS     LIPOMA EXCISION Left    LUMBAR MICRODISCECTOMY     TRANSESOPHAGEAL ECHOCARDIOGRAM  02/13/2011   [facility] -> Dr. Joanne Muckle [facility]): EF 55 to 60%.  Borderline LA dilation.  Prolapse of A2 scallop of mitral valve-mild MVP with mild to moderate MR.  (2 eccentric jets, posteriorly directed, c/w anterior leaflet pathology))   TRANSTHORACIC ECHOCARDIOGRAM  12/11/2010   [facility] -> Dr. Joanne Muckle [facility]Card[facility]): EF 35 to 6%.  Mild LA dilation.  Moderate MR-eccentric/possibly directed jet consistent with  antilipid pathology.  Mild TR, LP HTN, mild PR.  Recommend TEE.   TREADMILL STRESS ECHOCARDIOGRAM     [facility] -> Dr. Joanne Muckle [facility]): Exercised 10 minutes (11 minutes), achieved Max Heart Rate 162 = 92% Max Predicted Heart Rate, clinically and electrocardiographically negative.  Nonspecific ST and T wave changes.  Prestress echo showed EF 55 to 60% with normal wall motion.  Post-Stress: EF 65 to 70% with nl augmentation of all walls.=> NORMAL STRESS [facility]   Patient Active Problem List   Diagnosis Date Noted   Osteoporosis 09/22/2023   Migraine headache 09/22/2023   Hypothyroidism 11/09/2021   Type 1 diabetes mellitus with hyperglycemia (HCC) 08/22/2020   History of mitral valve prolapse in adulthood 08/23/2010     REFERRING PROVIDER:  Wilhelmenia Harada, MD     REFERRING DIAG: M25.351 (ICD-10-CM) - Hip instability, right   Rationale for Evaluation and Treatment: Rehabilitation  THERAPY DIAG:  Difficulty in walking, not elsewhere classified  Abnormal posture  Pain in right hip  ONSET DATE: 07/21/23   SUBJECTIVE:  SUBJECTIVE STATEMENT: [place]. I jogged across the street without thinking about it.   ERO: New referral for her shoulder pain is in today. In regards to her shoulder, she feels she has some underlying R shoulder arthritis with history of bursitis in her L shoulder. Thinks she may have had a touch of bursitis on her R shoulder even prior to her fall. During the fall, she did land on her R shoulder and overused it with using the crutches which greatly irritated it. Did get steroid shot in shoulder 08/11/23. Still gets issues with putting on her shirt -- gets a shooting pain. Most bothered with driving and her arm will start hurting.  Pt is right hand dominant so using it at work also makes it fatigued and sore. Feels that the pain is more generalized from the medial arm which can radiate down the forearms.    Eval: T1DM and maintain a good AI1. 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 I like to go sailing and skiing, enjoys trail walking around [facility] Has 4 dogs and husband is an avid exerciser. I have had multiple LE fx but have been trauma-related. Bilat 5th mets, Rt ankle fx 2 locations. Has had back surgery at L4-5 discectomy. Jan 27th went on a family trip- skiing on new equipment- fell back, more on right side with bruise on Rt shoulder. I think the back of my skiis crossed and opened anteriorly and knees splayed out to strain groin muscles. Bindings did not release in fall. Hauled out by ski patrol and was in [facility]- ER xrays denied fx and sent me out on crutches.  After a week or 2 was not improving and tailbone felt like it was on fire. MRI showed bilat fractures, including sacrum. Did not miss a day at work and was unable to tolerate pain on walker. Developed tendonitis in right shoulder walking around the hospital on crutches. The 17th of Feb, Rt shoulder steroid injection. Discovered hip instability when I stepped into the tub-shower with right foot and felt something shift. When I rolled back to put my socks on, both hips shifted/popped/caught and had 10/10 pain and spent the day in bed. Dr B gave me a steroid shot in right hip on Monday and have had dramatic improvement. Was able to tolerate laying on her side for about 15 min last night. Walked short distances at home without RW.   PERTINENT HISTORY:  H/o ankle fx without surgery. T1DM Lt shoulder bursitis with PT in the past Injection to Rt shoulder recently for tendonitis   PAIN:  Are you having pain? Yes: NPRS scale: no pain at present, tightness in hamstrings Pain location: Rt hip, tailbone, Lateral Lt thigh sensation Pain description: I feel a sensation down lateral  Lt thigh Aggravating factors: walking Relieving factors: reduced WB, injection  PRECAUTIONS:  None  RED FLAGS: None   WEIGHT BEARING RESTRICTIONS:  WBAT  FALLS:  Has patient fallen in last 6 months? Yes. 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  DIAGNOSTIC FINDINGS:  DG hip 08/14/23: Fractures of the bilateral puboacetabular junction, inferior pubic rami and right sacrum a MRI are not well-defined by radiograph. The right inferior ramus fracture is potentially visualized, the other fractures are radiographically occult. There is no hip dislocation. No pubic symphyseal or sacroiliac  diastasis.  PATIENT SURVEYS:  LEFS 18 09/25/23: 53/80  5/15: 57/80  COGNITIVE STATUS: Within functional limits for tasks assessed   SENSATION: WFL  POSTURE:  Standing: Rt shoulder depression, decreased thoracic kyphosis & lumbar lordosis, notable pelvic rotation Supine: Lt leg functionally shorter, Rt anterior innom  HAND DOMINANCE:  Right  GAIT: Eval: RW, slow cadence, short stride  UPPER EXTREMITY ROM:   Active ROM Right 10/07/23 Left 10/07/23  Shoulder flexion 165 158  Shoulder extension    Shoulder abduction 160 Wants to help bringing her arm down 160  Shoulder adduction    Shoulder extension    Shoulder internal rotation T7 with pain T4  Shoulder external rotation 45 with pain 54   (Blank rows = not tested)  UPPER EXTREMITY MMT:  MMT Right 10/07/23 Left 10/07/23  Shoulder flexion 5 5  Shoulder extension    Shoulder abduction 5 with pain 5  Shoulder adduction    Shoulder extension    Shoulder internal rotation 5 5  Shoulder external rotation 5 5  Middle trapezius in prone 3+ with pain   Lower trapezius in prone 3- with pain    (Blank rows = not tested)  MMT (lb) Right 5/15 Left 5/15   Hip flexion    Hip extension    Hip abduction- sidelying 11.6 19.4  Hip adduction    Hip internal rotation    Hip external rotation    Knee flexion    Knee extension 50.0 52.4   (Blank rows = not tested)   TREATMENT DATE:   5/22: Squat with single LE medial anchor resist, green x15 and then a 10s hold Lateral runners step up Standing plank with resisted hip flexion Plank<>primal push up Seated on bosu- C sit with UE progressions flexion & abd, addition of march and trunk rotation  5/15: Muscle testing & LEFS Lateral step up 6" Single leg stance and with squat- red tband at knees, chair for single UE support Wall squat with mini clam red tband Squat to heel raise  5/13: -Butterfly stretch supine -Bridge x20 -90/90 march with tA  -quadruped cat/cow to childs pose x10ea -sidelying hip circles x10ea/bil -prone shoulder row 4# 2x10 R -staggered STS x10ea -sumo squats with 10lb KB 2x10 -lunges x5ea (holding onto back of bike)    PATIENT EDUCATION:  Education details: rationale for interventions, HEP  Person educated: Patient Education method: Programmer, multimedia, Demonstration, Tactile cues, Verbal cues Education comprehension: verbalized understanding, returned demonstration, verbal cues required, tactile cues required, and needs further education     HOME EXERCISE PROGRAM: [id number] Hesch self correction for Rt anterior innominate rotation   ASSESSMENT:  CLINICAL IMPRESSION: Split each exercise into neuro re ed to improve core/glut utilization vs overuse of hip flexors and lumbar extensors. Excellent progression and tolerance for unstable surface walking as in her 3.5 mile hike today. MRI scheduled for shoulder on Tuesday.   Eval: Patient is a 58 y.o. F who was seen today for physical therapy evaluation and treatment for Rt hip pain following an incident on skiis that resulted in strained ER through bilateral LEs. Multiple pelvic fractures and strain through adductors,  history of lumbar spine surgical intervention all contributing to pain. Pt does have known tearing in hip labrum but is feeling much better since the injection and would like to avoid surgery if possible.  Pt was very active prior to the accident and would like to return to PLOF. Pt also notes Rt shoulder pain onset since the fall- notable postural  changes through thoracic spine creating biomechanical chain input from pelvis to shoulder.     REHAB POTENTIAL: Good  CLINICAL DECISION MAKING: Stable/uncomplicated  EVALUATION COMPLEXITY: Low   GOALS: Goals reviewed with patient? Yes  SHORT TERM GOALS: Target date: 3/22  Able to ambulate household distances, without AD, without antalgic pattern Baseline: Goal status: MET  2.  Demo proper core control with exercises Baseline:  Goal status: MET    LONG TERM GOALS: Target date: POC date  Hip strength 90% of left side Baseline: MMT to be performed as appropriate Goal status: ongoing  2.  Progressing into plyometric motions with minimal to no pain Baseline:  Goal status: ongoing  3.  Return to work with understanding of exercises & rest breaks to reduce discomfort Baseline:  Goal status: ongoing- requires further progression  4.  Navigate uneven surfaces with minimal to no pain Baseline:  Goal status: partially met- able to ride boat with fatigue but no pain. Walked the trails at the healing gardens at [employer]  5.  Pt will be able to move R UE in all directions without pain for dressing and driving tasks Baseline:  Goal status: ongoing- f/u with MD   6.  Pt will demo at least 4/5 periscapular strength to demo increased shoulder stability and decreased nerve impingement Baseline:  Goal status: partially met- good strength but shoulder flares up quickly.      PLAN:  PT FREQUENCY: 1-2x/week  PT DURATION: 12 weeks  PLANNED INTERVENTIONS: [id number]- PT Re-evaluation, 97110-Therapeutic exercises, 97530- Therapeutic activity, 97112-  Neuromuscular re-education, 97535- Self Care, 11914- Manual therapy, [phone]- Gait training, [phone]- Aquatic Therapy, [phone]- Electrical stimulation (unattended), Patient/Family education, Balance training, Stair training, Taping, Dry Needling, Joint mobilization, Spinal mobilization, Cryotherapy, and Moist heat.  PLAN FOR NEXT SESSION: consider aquatics. Pt also interested in interventions looking at hamstring tightness, massage therapy. Strengthen rhomboids, mid traps, low traps; stretch pec minor/major; nerve glides/stretches  Varick Keys C. Elazar Argabright PT, DPT 11/13/23 4:50 PM

## 2023-11-18 ENCOUNTER — Encounter (HOSPITAL_BASED_OUTPATIENT_CLINIC_OR_DEPARTMENT_OTHER): Payer: Self-pay

## 2023-11-18 ENCOUNTER — Ambulatory Visit (HOSPITAL_BASED_OUTPATIENT_CLINIC_OR_DEPARTMENT_OTHER): Payer: Commercial Managed Care - PPO

## 2023-11-18 ENCOUNTER — Ambulatory Visit
Admission: RE | Admit: 2023-11-18 | Discharge: 2023-11-18 | Disposition: A | Discharge status: Home / Self Care | Source: Ambulatory Visit | Attending: Orthopaedic Surgery

## 2023-11-18 DIAGNOSIS — M25551 Pain in right hip: Secondary | ICD-10-CM | POA: Diagnosis not present

## 2023-11-18 DIAGNOSIS — M25511 Pain in right shoulder: Secondary | ICD-10-CM | POA: Diagnosis not present

## 2023-11-18 DIAGNOSIS — M6281 Muscle weakness (generalized): Secondary | ICD-10-CM | POA: Diagnosis not present

## 2023-11-18 DIAGNOSIS — M25561 Pain in right knee: Secondary | ICD-10-CM

## 2023-11-18 DIAGNOSIS — R262 Difficulty in walking, not elsewhere classified: Secondary | ICD-10-CM

## 2023-11-18 DIAGNOSIS — M7581 Other shoulder lesions, right shoulder: Secondary | ICD-10-CM | POA: Diagnosis not present

## 2023-11-18 DIAGNOSIS — R293 Abnormal posture: Secondary | ICD-10-CM

## 2023-11-18 DIAGNOSIS — M19011 Primary osteoarthritis, right shoulder: Secondary | ICD-10-CM | POA: Diagnosis not present

## 2023-11-18 NOTE — Therapy (Signed)
 OUTPATIENT PHYSICAL THERAPY TREATMENT  Patient Name: Maria Evans MRN: [id number] DOB:01-08-1966, 58 y.o., female Today's Date: 11/18/2023  END OF SESSION:  PT End of Session - 11/18/23 1715     Visit Number 21    Number of Visits 35    Date for PT Re-Evaluation 01/24/24    Authorization Type [facility] [employer]    PT Start Time 1604    PT Stop Time 1646    PT Time Calculation (min) 42 min    Activity Tolerance Patient tolerated treatment well    Behavior During Therapy WFL for tasks assessed/performed                    Past Medical History:  Diagnosis Date   Anxiety    Anxiety with depression    Controlled.   Common migraine without aura    Diabetes mellitus type I, controlled (HCC)    Treated with 4 times daily insulin  based on blood sugar monitoring   Hip sprain    & contusion   Hyperlipidemia due to type 1 diabetes mellitus (HCC)    Currently on atorvastatin .   Hypothyroid    Lipoma    MVP (mitral valve prolapse) 2005   Renally diagnosed back in 2005 (during her pregnancy)_, most recently evaluated in 2012.  [facility], Dr. Joanne Muckle); 11/2010 TTE: Moderate MR, posteriorly directed (recommend TEE) => TEE (01/2011) - mild MVP (anterior leaflet) with Mild-Mod MR.;    Past Surgical History:  Procedure Laterality Date   LAPAROSCOPIC LYSIS OF ADHESIONS     LIPOMA EXCISION Left    LUMBAR MICRODISCECTOMY     TRANSESOPHAGEAL ECHOCARDIOGRAM  02/13/2011   [facility] -> Dr. Joanne Muckle [facility]): EF 55 to 60%.  Borderline LA dilation.  Prolapse of A2 scallop of mitral valve-mild MVP with mild to moderate MR.  (2 eccentric jets, posteriorly directed, c/w anterior leaflet pathology))   TRANSTHORACIC ECHOCARDIOGRAM  12/11/2010   [facility] -> Dr. Joanne Muckle [facility]): EF 35 to 6%.  Mild LA dilation.  Moderate MR-eccentric/possibly directed jet consistent with  antilipid pathology.  Mild TR, LP HTN, mild PR.  Recommend TEE.   TREADMILL STRESS ECHOCARDIOGRAM     [facility] -> Dr. Joanne Muckle [facility]): Exercised 10 minutes (11 minutes), achieved Max Heart Rate 162 = 92% Max Predicted Heart Rate, clinically and electrocardiographically negative.  Nonspecific ST and T wave changes.  Prestress echo showed EF 55 to 60% with normal wall motion.  Post-Stress: EF 65 to 70% with nl augmentation of all walls.=> NORMAL STRESS [facility]   Patient Active Problem List   Diagnosis Date Noted   Osteoporosis 09/22/2023   Migraine headache 09/22/2023   Hypothyroidism 11/09/2021   Type 1 diabetes mellitus with hyperglycemia (HCC) 08/22/2020   History of mitral valve prolapse in adulthood 08/23/2010     REFERRING PROVIDER:  Wilhelmenia Harada, MD     REFERRING DIAG: M25.351 (ICD-10-CM) - Hip instability, right   Rationale for Evaluation and Treatment: Rehabilitation  THERAPY DIAG:  Difficulty in walking, not elsewhere classified  Abnormal posture  Pain in right hip  Muscle weakness (generalized)  Pain in joint of right knee  Acute pain of right shoulder  ONSET DATE: 07/21/23   SUBJECTIVE:  SUBJECTIVE STATEMENT: Pt reports she has been working on her exercises at home. Had shoulder MRI this morning.   ERO: New referral for her shoulder pain is in today. In regards to her shoulder, she feels she has some underlying R shoulder arthritis with history of bursitis in her L shoulder. Thinks she may have had a touch of bursitis on her R shoulder even prior to her fall. During the fall, she did land on her R shoulder and overused it with using the crutches which greatly irritated it. Did get steroid shot in shoulder 08/11/23. Still gets issues with  putting on her shirt -- gets a shooting pain. Most bothered with driving and her arm will start hurting. Pt is right hand dominant so using it at work also makes it fatigued and sore. Feels that the pain is more generalized from the medial arm which can radiate down the forearms.    Eval: T1DM and maintain a good AI1. 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 I like to go sailing and skiing, enjoys trail walking around [facility]. Has 4 dogs and husband is an avid exerciser. I have had multiple LE fx but have been trauma-related. Bilat 5th mets, Rt ankle fx 2 locations. Has had back surgery at L4-5 discectomy. Jan 27th went on a family trip- skiing on new equipment- fell back, more on right side with bruise on Rt shoulder. I think the back of my skiis crossed and opened anteriorly and knees splayed out to strain groin muscles. Bindings did not release in fall. Hauled out by ski patrol and was in [facility]- ER xrays denied fx and sent me out on crutches.  After a week or 2 was not improving and tailbone felt like it was on fire. MRI showed bilat fractures, including sacrum. Did not miss a day at work and was unable to tolerate pain on walker. Developed tendonitis in right shoulder walking around the hospital on crutches. The 17th of Feb, Rt shoulder steroid injection. Discovered hip instability when I stepped into the tub-shower with right foot and felt something shift. When I rolled back to put my socks on, both hips shifted/popped/caught and had 10/10 pain and spent the day in bed. Dr B gave me a steroid shot in right hip on Monday and have had dramatic improvement. Was able to tolerate laying on her side for about 15 min last night. Walked short distances at home without RW.   PERTINENT HISTORY:  H/o ankle fx without surgery. T1DM Lt shoulder bursitis with PT in the past Injection to Rt shoulder recently for tendonitis   PAIN:  Are you having pain? Yes: NPRS scale: no pain at present, tightness in hamstrings Pain  location: Rt hip, tailbone, Lateral Lt thigh sensation Pain description: I feel a sensation down lateral Lt thigh Aggravating factors: walking Relieving factors: reduced WB, injection  PRECAUTIONS:  None  RED FLAGS: None   WEIGHT BEARING RESTRICTIONS:  WBAT  FALLS:  Has patient fallen in last 6 months? Yes. 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  DIAGNOSTIC FINDINGS:  DG hip 08/14/23: Fractures of the bilateral puboacetabular junction, inferior pubic rami and right sacrum a MRI are not well-defined by radiograph. The right inferior ramus fracture is potentially visualized, the other fractures are radiographically occult. There is no hip dislocation. No pubic symphyseal  or sacroiliac diastasis.  PATIENT SURVEYS:  LEFS 18 09/25/23: 53/80  5/15: 57/80  COGNITIVE STATUS: Within functional limits for tasks assessed   SENSATION: WFL  POSTURE:  Standing: Rt shoulder depression, decreased thoracic kyphosis & lumbar lordosis, notable pelvic rotation Supine: Lt leg functionally shorter, Rt anterior innom  HAND DOMINANCE:  Right  GAIT: Eval: RW, slow cadence, short stride  UPPER EXTREMITY ROM:   Active ROM Right 10/07/23 Left 10/07/23  Shoulder flexion 165 158  Shoulder extension    Shoulder abduction 160 Wants to help bringing her arm down 160  Shoulder adduction    Shoulder extension    Shoulder internal rotation T7 with pain T4  Shoulder external rotation 45 with pain 54   (Blank rows = not tested)  UPPER EXTREMITY MMT:  MMT Right 10/07/23 Left 10/07/23  Shoulder flexion 5 5  Shoulder extension    Shoulder abduction 5 with pain 5  Shoulder adduction    Shoulder extension    Shoulder internal rotation 5 5  Shoulder external rotation 5 5  Middle trapezius in prone 3+ with pain    Lower trapezius in prone 3- with pain    (Blank rows = not tested)  MMT (lb) Right 5/15 Left 5/15  Hip flexion    Hip extension    Hip abduction- sidelying 11.6 19.4  Hip adduction    Hip internal rotation    Hip external rotation    Knee flexion    Knee extension 50.0 52.4   (Blank rows = not tested)   TREATMENT DATE:   5/27 Frog stretch Bird dog x10ea Qped Fire hydrants GTB 2x10ea S/l hip circles x10ea/bil Lateral step up 6" x10ea Single leg stance and with squat- green  tband at knees, chair for single UE support Wall squat with mini clam green tband 2x10 Squat to heel raise Lunges holding onto back of bike x10ea Standing march on airex (slow) x10ea   5/22: Squat with single LE medial anchor resist, green x15 and then a 10s hold Lateral runners step up Standing plank with resisted hip flexion Plank<>primal push up Seated on bosu- C sit with UE progressions flexion & abd, addition of march and trunk rotation  5/15: Muscle testing & LEFS Lateral step up 6" Single leg stance and with squat- red tband at knees, chair for single UE support Wall squat with mini clam red tband Squat to heel raise Lunges holding onto back of bike x10ea Standing adductor stretching  5/13: -Butterfly stretch supine -Bridge x20 -90/90 march with tA  -quadruped cat/cow to childs pose x10ea -sidelying hip circles x10ea/bil -prone shoulder row 4# 2x10 R -staggered STS x10ea -sumo squats with 10lb KB 2x10 -lunges x5ea (holding onto back of bike)    PATIENT EDUCATION:  Education details: rationale for interventions, HEP  Person educated: Patient Education method: Programmer, multimedia, Demonstration, Tactile cues, Verbal cues Education comprehension: verbalized understanding, returned demonstration, verbal cues required, tactile cues required, and needs further education     HOME EXERCISE PROGRAM: [id number] Hesch self correction for Rt anterior innominate  rotation   ASSESSMENT:  CLINICAL IMPRESSION: Pt demonstrates improvement with technique of exercises and feels she can better activate her glutes with exercises. Pt does fatigue quickly in musculature however. Some difficulty observed with single squat to heel raise. When instructed, pt was unable to demonstrate a full heel raise on R LE with UE support. Instructed her to work on this at home to [employer]. Pt also challenged with slow marching on airex  pad due to balance deficits.   Eval: Patient is a 58 y.o. F who was seen today for physical therapy evaluation and treatment for Rt hip pain following an incident on skiis that resulted in strained ER through bilateral LEs. Multiple pelvic fractures and strain through adductors, history of lumbar spine surgical intervention all contributing to pain. Pt does have known tearing in hip labrum but is feeling much better since the injection and would like to avoid surgery if possible.  Pt was very active prior to the accident and would like to return to PLOF. Pt also notes Rt shoulder pain onset since the fall- notable postural changes through thoracic spine creating biomechanical chain input from pelvis to shoulder.     REHAB POTENTIAL: Good  CLINICAL DECISION MAKING: Stable/uncomplicated  EVALUATION COMPLEXITY: Low   GOALS: Goals reviewed with patient? Yes  SHORT TERM GOALS: Target date: 3/22  Able to ambulate household distances, without AD, without antalgic pattern Baseline: Goal status: MET  2.  Demo proper core control with exercises Baseline:  Goal status: MET    LONG TERM GOALS: Target date: POC date  Hip strength 90% of left side Baseline: MMT to be performed as appropriate Goal status: ongoing  2.  Progressing into plyometric motions with minimal to no pain Baseline:  Goal status: ongoing  3.  Return to work with understanding of exercises & rest breaks to reduce discomfort Baseline:  Goal status:  ongoing- requires further progression  4.  Navigate uneven surfaces with minimal to no pain Baseline:  Goal status: partially met- able to ride boat with fatigue but no pain. Walked the trails at the healing gardens at [employer]  5.  Pt will be able to move R UE in all directions without pain for dressing and driving tasks Baseline:  Goal status: ongoing- f/u with MD   6.  Pt will demo at least 4/5 periscapular strength to demo increased shoulder stability and decreased nerve impingement Baseline:  Goal status: partially met- good strength but shoulder flares up quickly.      PLAN:  PT FREQUENCY: 1-2x/week  PT DURATION: 12 weeks  PLANNED INTERVENTIONS: 97164- PT Re-evaluation, 97110-Therapeutic exercises, 97530- Therapeutic activity, 97112- Neuromuscular re-education, 97535- Self Care, 74259- Manual therapy, [phone]- Gait training, [phone]- Aquatic Therapy, [phone]- Electrical stimulation (unattended), Patient/Family education, Balance training, Stair training, Taping, Dry Needling, Joint mobilization, Spinal mobilization, Cryotherapy, and Moist heat.  PLAN FOR NEXT SESSION: consider aquatics. Pt also interested in interventions looking at hamstring tightness, massage therapy. Strengthen rhomboids, mid traps, low traps; stretch pec minor/major; nerve glides/stretches  Herb Loges, PTA  11/18/23 5:28 PM

## 2023-11-19 ENCOUNTER — Other Ambulatory Visit (HOSPITAL_COMMUNITY): Payer: Self-pay

## 2023-11-19 DIAGNOSIS — F4322 Adjustment disorder with anxiety: Secondary | ICD-10-CM | POA: Diagnosis not present

## 2023-11-20 ENCOUNTER — Ambulatory Visit (HOSPITAL_BASED_OUTPATIENT_CLINIC_OR_DEPARTMENT_OTHER): Payer: Commercial Managed Care - PPO | Admitting: Physical Therapy

## 2023-11-20 ENCOUNTER — Encounter (HOSPITAL_BASED_OUTPATIENT_CLINIC_OR_DEPARTMENT_OTHER): Payer: Self-pay | Admitting: Physical Therapy

## 2023-11-20 DIAGNOSIS — R262 Difficulty in walking, not elsewhere classified: Secondary | ICD-10-CM | POA: Diagnosis not present

## 2023-11-20 DIAGNOSIS — M25561 Pain in right knee: Secondary | ICD-10-CM | POA: Diagnosis not present

## 2023-11-20 DIAGNOSIS — R293 Abnormal posture: Secondary | ICD-10-CM

## 2023-11-20 DIAGNOSIS — M25551 Pain in right hip: Secondary | ICD-10-CM

## 2023-11-20 DIAGNOSIS — M6281 Muscle weakness (generalized): Secondary | ICD-10-CM

## 2023-11-20 DIAGNOSIS — M25511 Pain in right shoulder: Secondary | ICD-10-CM | POA: Diagnosis not present

## 2023-11-20 NOTE — Therapy (Signed)
 OUTPATIENT PHYSICAL THERAPY TREATMENT  Patient Name: YULIZA CARA MRN: [id number] DOB:03/25/1966, 58 y.o., female Today's Date: 11/20/2023  END OF SESSION:  PT End of Session - 11/20/23 1557     Visit Number 22    Number of Visits 35    Date for PT Re-Evaluation 01/24/24    Authorization Type [facility] [employer]    PT Start Time 1555    PT Stop Time 1640    PT Time Calculation (min) 45 min    Activity Tolerance Patient tolerated treatment well    Behavior During Therapy WFL for tasks assessed/performed                     Past Medical History:  Diagnosis Date   Anxiety    Anxiety with depression    Controlled.   Common migraine without aura    Diabetes mellitus type I, controlled (HCC)    Treated with 4 times daily insulin  based on blood sugar monitoring   Hip sprain    & contusion   Hyperlipidemia due to type 1 diabetes mellitus (HCC)    Currently on atorvastatin .   Hypothyroid    Lipoma    MVP (mitral valve prolapse) 2005   Renally diagnosed back in 2005 (during her pregnancy)_, most recently evaluated in 2012.  [facility] - Cardiology, Dr. Joanne Muckle); 11/2010 TTE: Moderate MR, posteriorly directed (recommend TEE) => TEE (01/2011) - mild MVP (anterior leaflet) with Mild-Mod MR.;    Past Surgical History:  Procedure Laterality Date   LAPAROSCOPIC LYSIS OF ADHESIONS     LIPOMA EXCISION Left    LUMBAR MICRODISCECTOMY     TRANSESOPHAGEAL ECHOCARDIOGRAM  02/13/2011   [facility] -> Dr. Joanne Muckle [facility]): EF 55 to 60%.  Borderline LA dilation.  Prolapse of A2 scallop of mitral valve-mild MVP with mild to moderate MR.  (2 eccentric jets, posteriorly directed, c/w anterior leaflet pathology))   TRANSTHORACIC ECHOCARDIOGRAM  12/11/2010   [facility] -> Dr. Joanne Muckle [facility]): EF 35 to 6%.  Mild LA dilation.  Moderate MR-eccentric/possibly directed jet consistent with  antilipid pathology.  Mild TR, LP HTN, mild PR.  Recommend TEE.   TREADMILL STRESS ECHOCARDIOGRAM     [facility] -> Dr. Joanne Muckle [facility]Card[facility]): Exercised 10 minutes (11 minutes), achieved Max Heart Rate 162 = 92% Max Predicted Heart Rate, clinically and electrocardiographically negative.  Nonspecific ST and T wave changes.  Prestress echo showed EF 55 to 60% with normal wall motion.  Post-Stress: EF 65 to 70% with nl augmentation of all walls.=> NORMAL STRESS [facility]   Patient Active Problem List   Diagnosis Date Noted   Osteoporosis 09/22/2023   Migraine headache 09/22/2023   Hypothyroidism 11/09/2021   Type 1 diabetes mellitus with hyperglycemia (HCC) 08/22/2020   History of mitral valve prolapse in adulthood 08/23/2010     REFERRING PROVIDER:  Wilhelmenia Harada, MD     REFERRING DIAG: M25.351 (ICD-10-CM) - Hip instability, right   Rationale for Evaluation and Treatment: Rehabilitation  THERAPY DIAG:  Difficulty in walking, not elsewhere classified  Abnormal posture  Pain in right hip  Muscle weakness (generalized)  ONSET DATE: 07/21/23   SUBJECTIVE:  SUBJECTIVE STATEMENT: Walked the stairs to the 4th floor and back down.  Took my Mobic  a little late and really needed it, did not reach for the Tylenol  but I really felt it in my hips.   ERO: New referral for her shoulder pain is in today. In regards to her shoulder, she feels she has some underlying R shoulder arthritis with history of bursitis in her L shoulder. Thinks she may have had a touch of bursitis on her R shoulder even prior to her fall. During the fall, she did land on her R shoulder and overused it with using the crutches which greatly irritated it. Did get steroid shot in shoulder 08/11/23. Still gets  issues with putting on her shirt -- gets a shooting pain. Most bothered with driving and her arm will start hurting. Pt is right hand dominant so using it at work also makes it fatigued and sore. Feels that the pain is more generalized from the medial arm which can radiate down the forearms.    Eval: T1DM and maintain a good AI1. 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 I like to go sailing and skiing, enjoys trail walking around [facility] Has 4 dogs and husband is an avid exerciser. I have had multiple LE fx but have been trauma-related. Bilat 5th mets, Rt ankle fx 2 locations. Has had back surgery at L4-5 discectomy. Jan 27th went on a family trip- skiing on new equipment- fell back, more on right side with bruise on Rt shoulder. I think the back of my skiis crossed and opened anteriorly and knees splayed out to strain groin muscles. Bindings did not release in fall. Hauled out by ski patrol and was in [facility]- ER xrays denied fx and sent me out on crutches.  After a week or 2 was not improving and tailbone felt like it was on fire. MRI showed bilat fractures, including sacrum. Did not miss a day at work and was unable to tolerate pain on walker. Developed tendonitis in right shoulder walking around the hospital on crutches. The 17th of Feb, Rt shoulder steroid injection. Discovered hip instability when I stepped into the tub-shower with right foot and felt something shift. When I rolled back to put my socks on, both hips shifted/popped/caught and had 10/10 pain and spent the day in bed. Dr B gave me a steroid shot in right hip on Monday and have had dramatic improvement. Was able to tolerate laying on her side for about 15 min last night. Walked short distances at home without RW.   PERTINENT HISTORY:  H/o ankle fx without surgery. T1DM Lt shoulder bursitis with PT in the past Injection to Rt shoulder recently for tendonitis   PAIN:  Are you having pain? Yes: NPRS scale: no pain at present, tightness in  hamstrings Pain location: Rt hip, tailbone, Lateral Lt thigh sensation Pain description: I feel a sensation down lateral Lt thigh Aggravating factors: walking Relieving factors: reduced WB, injection  PRECAUTIONS:  None  RED FLAGS: None   WEIGHT BEARING RESTRICTIONS:  WBAT  FALLS:  Has patient fallen in last 6 months? Yes. 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  DIAGNOSTIC FINDINGS:  DG hip 08/14/23: Fractures of the bilateral puboacetabular junction, inferior pubic rami and right sacrum a MRI are not well-defined by radiograph. The right inferior  ramus fracture is potentially visualized, the other fractures are radiographically occult. There is no hip dislocation. No pubic symphyseal or sacroiliac diastasis.  PATIENT SURVEYS:  LEFS 18 09/25/23: 53/80  5/15: 57/80  COGNITIVE STATUS: Within functional limits for tasks assessed   SENSATION: WFL  POSTURE:  Standing: Rt shoulder depression, decreased thoracic kyphosis & lumbar lordosis, notable pelvic rotation Supine: Lt leg functionally shorter, Rt anterior innom  HAND DOMINANCE:  Right  GAIT: Eval: RW, slow cadence, short stride  UPPER EXTREMITY ROM:   Active ROM Right 10/07/23 Left 10/07/23  Shoulder flexion 165 158  Shoulder extension    Shoulder abduction 160 Wants to help bringing her arm down 160  Shoulder adduction    Shoulder extension    Shoulder internal rotation T7 with pain T4  Shoulder external rotation 45 with pain 54   (Blank rows = not tested)  UPPER EXTREMITY MMT:  MMT Right 10/07/23 Left 10/07/23  Shoulder flexion 5 5  Shoulder extension    Shoulder abduction 5 with pain 5  Shoulder adduction    Shoulder extension    Shoulder internal rotation 5 5  Shoulder external rotation 5 5  Middle trapezius in prone  3+ with pain   Lower trapezius in prone 3- with pain    (Blank rows = not tested)  MMT (lb) Right 5/15 Left 5/15  Hip flexion    Hip extension    Hip abduction- sidelying 11.6 19.4  Hip adduction    Hip internal rotation    Hip external rotation    Knee flexion    Knee extension 50.0 52.4   (Blank rows = not tested)   TREATMENT DATE:   5/29 Ab set- with feet wide, with bridge, with march Supine beginner scissors Lunges with yellow tband around knees Hip thrusters Hasmtring stretch, long sitting  5/27 Frog stretch Bird dog x10ea Qped Fire hydrants GTB 2x10ea S/l hip circles x10ea/bil Lateral step up 6" x10ea Single leg stance and with squat- green  tband at knees, chair for single UE support Wall squat with mini clam green tband 2x10 Squat to heel raise Lunges holding onto back of bike x10ea Standing march on airex (slow) x10ea   5/22: Squat with single LE medial anchor resist, green x15 and then a 10s hold Lateral runners step up Standing plank with resisted hip flexion Plank<>primal push up Seated on bosu- C sit with UE progressions flexion & abd, addition of march and trunk rotation    PATIENT EDUCATION:  Education details: rationale for interventions, HEP  Person educated: Patient Education method: Programmer, multimedia, Demonstration, Tactile cues, Verbal cues Education comprehension: verbalized understanding, returned demonstration, verbal cues required, tactile cues required, and needs further education     HOME EXERCISE PROGRAM: [id number] Hesch self correction for Rt anterior innominate rotation   ASSESSMENT:  CLINICAL IMPRESSION: Reaching a time for gentle plateau of exercise progression to focus on adjusting to current strength demands for lower body. Frequent cuing necessary for rotation control of pelvis and knee in lunges. F/u with MD on Monday regarding shoulder MRI.   Eval: Patient is a 58 y.o. F who was seen today for physical therapy evaluation  and treatment for Rt hip pain following an incident on skiis that resulted in strained ER through bilateral LEs. Multiple pelvic fractures and strain through adductors, history of lumbar spine surgical intervention all contributing to pain. Pt does have known tearing in hip labrum but is feeling much better since the injection and would like to avoid surgery  if possible.  Pt was very active prior to the accident and would like to return to PLOF. Pt also notes Rt shoulder pain onset since the fall- notable postural changes through thoracic spine creating biomechanical chain input from pelvis to shoulder.     REHAB POTENTIAL: Good  CLINICAL DECISION MAKING: Stable/uncomplicated  EVALUATION COMPLEXITY: Low   GOALS: Goals reviewed with patient? Yes  SHORT TERM GOALS: Target date: 3/22  Able to ambulate household distances, without AD, without antalgic pattern Baseline: Goal status: MET  2.  Demo proper core control with exercises Baseline:  Goal status: MET    LONG TERM GOALS: Target date: POC date  Hip strength 90% of left side Baseline: MMT to be performed as appropriate Goal status: ongoing  2.  Progressing into plyometric motions with minimal to no pain Baseline:  Goal status: ongoing  3.  Return to work with understanding of exercises & rest breaks to reduce discomfort Baseline:  Goal status: ongoing- requires further progression  4.  Navigate uneven surfaces with minimal to no pain Baseline:  Goal status: partially met- able to ride boat with fatigue but no pain. Walked the trails at the healing gardens at [employer]  5.  Pt will be able to move R UE in all directions without pain for dressing and driving tasks Baseline:  Goal status: ongoing- f/u with MD   6.  Pt will demo at least 4/5 periscapular strength to demo increased shoulder stability and decreased nerve impingement Baseline:  Goal status: partially met- good strength but shoulder flares up quickly.       PLAN:  PT FREQUENCY: 1-2x/week  PT DURATION: 12 weeks  PLANNED INTERVENTIONS: 97164- PT Re-evaluation, 97110-Therapeutic exercises, 97530- Therapeutic activity, 97112- Neuromuscular re-education, 97535- Self Care, 53664- Manual therapy, [phone]- Gait training, [phone]- Aquatic Therapy, [phone]- Electrical stimulation (unattended), Patient/Family education, Balance training, Stair training, Taping, Dry Needling, Joint mobilization, Spinal mobilization, Cryotherapy, and Moist heat.  PLAN FOR NEXT SESSION: MRI f/u outcome? Core and glute strength & control of LE rotation  Walida Cajas C. Javanna Patin PT, DPT 11/20/23 4:46 PM

## 2023-11-24 ENCOUNTER — Ambulatory Visit (HOSPITAL_BASED_OUTPATIENT_CLINIC_OR_DEPARTMENT_OTHER): Admitting: Orthopaedic Surgery

## 2023-11-24 DIAGNOSIS — M25511 Pain in right shoulder: Secondary | ICD-10-CM

## 2023-11-24 NOTE — Progress Notes (Signed)
 Chief Complaint: Right shoulder pain     History of Present Illness:   11/24/2023: Presents for MRI follow-up of the right shoulder   PMH/PSH/Family History/Social History/Meds/Allergies:    Past Medical History:  Diagnosis Date   Anxiety    Anxiety with depression    Controlled.   Common migraine without aura    Diabetes mellitus type I, controlled (HCC)    Treated with 4 times daily insulin  based on blood sugar monitoring   Hip sprain    & contusion   Hyperlipidemia due to type 1 diabetes mellitus (HCC)    Currently on atorvastatin .   Hypothyroid    Lipoma    MVP (mitral valve prolapse) 2005   Renally diagnosed back in 2005 (during her pregnancy)_, most recently evaluated in 2012.  [facility] - Cardiology, Dr. Joanne Muckle); 11/2010 TTE: Moderate MR, posteriorly directed (recommend TEE) => TEE (01/2011) - mild MVP (anterior leaflet) with Mild-Mod MR.;    Past Surgical History:  Procedure Laterality Date   LAPAROSCOPIC LYSIS OF ADHESIONS     LIPOMA EXCISION Left    LUMBAR MICRODISCECTOMY     TRANSESOPHAGEAL ECHOCARDIOGRAM  02/13/2011   [facility] -> Dr. Joanne Muckle [facility]Card[facility]): EF 55 to 60%.  Borderline LA dilation.  Prolapse of A2 scallop of mitral valve-mild MVP with mild to moderate MR.  (2 eccentric jets, posteriorly directed, c/w anterior leaflet pathology))   TRANSTHORACIC ECHOCARDIOGRAM  12/11/2010   [facility] -> Dr. Joanne Muckle [facility]): EF 35 to 6%.  Mild LA dilation.  Moderate MR-eccentric/possibly directed jet consistent with antilipid pathology.  Mild TR, LP HTN, mild PR.  Recommend TEE.   TREADMILL STRESS ECHOCARDIOGRAM     [facility] -> Dr. Joanne Muckle [facility]Cardiology): Exercised 10 minutes (11 minutes), achieved Max Heart Rate 162 = 92% Max Predicted Heart Rate, clinically and electrocardiographically negative.  Nonspecific ST and  T wave changes.  Prestress echo showed EF 55 to 60% with normal wall motion.  Post-Stress: EF 65 to 70% with nl augmentation of all walls.=> NORMAL STRESS [facility]   Social History   Socioeconomic History   Marital status: Married    Spouse name: Not on file   Number of children: 1   Years of education: Not on file   Highest education level: Not on file  Occupational History   Occupation: Hospital pharmacist     Employer: [employer]  Tobacco Use   Smoking status: Never   Smokeless tobacco: Never  Vaping Use   Vaping status: Never Used  Substance and Sexual Activity   Alcohol use: Yes    Comment: occ, wine or beer   Drug use: Never   Sexual activity: Yes    Partners: Male    Birth control/protection: Post-menopausal  Other Topics Concern   Not on file  Social History Narrative   She is currently divorced mother of 62 (77 year old son).   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    She is now with her longtime high school significant other after completing the divorce from her first husband.     She and her fianc intend to elope probably in May or June of this year.      She is an avid exerciser.  Enjoys jogging and walking.  She does not list 2 miles with  treadmill and walks about 45 minutes most days - either indoors on the treadmill or outside.         Social Drivers of Corporate investment banker Strain: Not on file  Food Insecurity: Not on file  Transportation Needs: Not on file  Physical Activity: Not on file  Stress: Not on file  Social Connections: Not on file   Family History  Problem Relation Age of Onset   Hyperlipidemia Mother    Hyperlipidemia Father    Heart Problems Father    Diabetes Brother    Esophageal cancer Maternal Grandfather        Long-term smoker   CAD Paternal Grandmother        Long-term smoker    Heart attack Paternal Grandmother 84       Cause of death   Stroke Paternal Grandmother    Allergies  Allergen Reactions   Nitrofurantoin Itching and Other (See Comments)   Other     Squash- hives, itching   Current Outpatient Medications  Medication Sig Dispense Refill   Acetaminophen  (TYLENOL  PO) Take by mouth as needed.     ALPRAZolam  (XANAX ) 0.5 MG tablet Take 1 tablet (0.5 mg total) by mouth daily as needed. 30 tablet 0   ALPRAZolam  (XANAX ) 0.5 MG tablet Take 1 tablet (0.5 mg total) by mouth daily as needed. 30 tablet 0   atorvastatin  (LIPITOR) 20 MG tablet Take 1 tablet (20 mg total) by mouth every other day. 90 tablet 3   Blood Pressure Monitoring (OMRON 3 SERIES BP MONITOR) DEVI Use as directed 1 each 0   Continuous Blood Gluc Receiver (DEXCOM G6 RECEIVER) DEVI Use to check blood sugar 4 times daily 1 each 0   Continuous Glucose Sensor (DEXCOM G6 SENSOR) MISC USE WITH DEXCOM TO CHECK BLOOD SUGAR 4 TIMES DAILY 9 each 3   Continuous Glucose Transmitter (DEXCOM G6 TRANSMITTER) MISC Use as instructed to check blood sugar change every 90 days 1 each 3   cyclobenzaprine  (FLEXERIL ) 10 MG tablet Take 1 tablet (10 mg total) by mouth at bedtime as needed. 60 tablet 1   escitalopram  (LEXAPRO ) 20 MG tablet Take 1 tablet (20 mg total) by mouth daily. 90 tablet 0   fluocinonide  gel (LIDEX ) 0.05 % Apply Externally Twice a day as needed for flare ups 30 g 1   Glucagon  3 MG/DOSE POWD Place 3 mg into the nose once as needed for up to 1 dose. 1 each 11   glucose blood (FREESTYLE TEST STRIPS) test strip Use as instructed 1-2 times a day 100 each 12   GVOKE HYPOPEN  1-PACK 1 MG/0.2ML SOAJ Inject 0.2 mLs into the skin as needed. 0.2 mL PRN   insulin  degludec (TRESIBA  FLEXTOUCH) 100 UNIT/ML FlexTouch Pen Inject up to 13 Units into the skin daily. (Patient taking differently: Inject into the skin daily. 11-13 units) 9 mL 3   Insulin  Lispro-aabc (LYUMJEV  KWIKPEN) 100 UNIT/ML KwikPen Inject up to 8 units  before each meal 3-4 times a day (Patient taking differently: Inject up to 11 units before each meal 3-4 times a day) 15 mL 11   Insulin  NPH, Human,, Isophane, (HUMULIN  N KWIKPEN) 100 UNIT/ML Kiwkpen Inject 2-4 Units into the skin daily in the afternoon. 15 mL 11   Insulin  Pen Needle (INSUPEN PEN NEEDLES) 32G X 4 MM MISC Use as directed 4-6 times daily 300 each 3   levocetirizine (XYZAL ) 5 MG tablet Take 1 tablet (5 mg total) by mouth every evening. 90 tablet  3   levothyroxine  (SYNTHROID ) 100 MCG tablet Take 1 tablet (100 mcg total) by mouth daily before breakfast. 90 tablet 3   lisinopril  (ZESTRIL ) 2.5 MG tablet Take 1 tablet (2.5 mg total) by mouth daily. 90 tablet 0   meloxicam  (MOBIC ) 15 MG tablet Take 1 tablet (15 mg total) by mouth daily with breakfast for 14 days, THEN 1 tablet (15 mg total) daily as needed. (Patient not taking: Reported on 09/22/2023) 30 tablet 1   Multiple Vitamin (MULTIVITAMIN) capsule Take 1 capsule by mouth daily.     oxyCODONE -acetaminophen  (PERCOCET/ROXICET) 5-325 MG tablet Take 1 tablet by mouth every 8 (eight) hours as needed for severe pain (pain score 7-10). 15 tablet 0   rizatriptan  (MAXALT -MLT) 10 MG disintegrating tablet Take 1 tablet (10 mg total) by mouth as needed for migraine. May repeat once in 2 hours if needed. 10 tablet 3   traMADol  (ULTRAM ) 50 MG tablet Take 1 tablet (50 mg total) by mouth every 8 (eight) hours as needed for severe pain (pain score 7-10). 15 tablet 0   valACYclovir (VALTREX) 1000 MG tablet Take 1,000 mg by mouth as needed (fever blisters).     No current facility-administered medications for this visit.   No results found.  Review of Systems:   A ROS was performed including pertinent positives and negatives as documented in the HPI.  Physical Exam :   Constitutional: NAD and appears stated age Neurological: Alert and oriented Psych: Appropriate affect and cooperative Last menstrual period 02/07/2016.   Comprehensive  Musculoskeletal Exam:    Right shoulder with lateral deltoid pain.  Positive Neer impingement.  Forward elevation is to 170 degrees bilaterally.  Imaging:   Xray (3 views right shoulder): Normal  MRI right shoulder: There is some bursal sided fraying with subacromial spur   I personally reviewed and interpreted the radiographs.   Assessment and Plan:   58 y.o. female with right shoulder pain consistent with possible rotator cuff impingement.  There does appear to be some bursal sided fraying in the setting of a subacromial spur.  At this time she is doing quite well with activity modification etc. we will plan to defer any additional intervention.  I will plan to see her back as needed  -Return to clinic as needed for either right shoulder or bilateral hips   I personally saw and evaluated the patient, and participated in the management and treatment plan.  Wilhelmenia Harada, MD Attending Physician, Orthopedic Surgery  This document was dictated using Dragon voice recognition software. A reasonable attempt at proof reading has been made to minimize errors.

## 2023-11-26 ENCOUNTER — Encounter (HOSPITAL_BASED_OUTPATIENT_CLINIC_OR_DEPARTMENT_OTHER): Payer: Self-pay | Admitting: Physical Therapy

## 2023-11-26 ENCOUNTER — Ambulatory Visit (HOSPITAL_BASED_OUTPATIENT_CLINIC_OR_DEPARTMENT_OTHER): Attending: Orthopaedic Surgery | Admitting: Physical Therapy

## 2023-11-26 DIAGNOSIS — F4322 Adjustment disorder with anxiety: Secondary | ICD-10-CM | POA: Diagnosis not present

## 2023-11-26 DIAGNOSIS — M25551 Pain in right hip: Secondary | ICD-10-CM | POA: Diagnosis not present

## 2023-11-26 DIAGNOSIS — M6281 Muscle weakness (generalized): Secondary | ICD-10-CM | POA: Diagnosis not present

## 2023-11-26 DIAGNOSIS — R262 Difficulty in walking, not elsewhere classified: Secondary | ICD-10-CM | POA: Diagnosis not present

## 2023-11-26 DIAGNOSIS — R293 Abnormal posture: Secondary | ICD-10-CM | POA: Diagnosis not present

## 2023-11-26 DIAGNOSIS — M25511 Pain in right shoulder: Secondary | ICD-10-CM | POA: Diagnosis not present

## 2023-11-26 NOTE — Therapy (Signed)
 OUTPATIENT PHYSICAL THERAPY TREATMENT  Patient Name: Maria Evans MRN: [id number] DOB:Nov 20, 1965, 58 y.o., female Today's Date: 11/26/2023  END OF SESSION:  PT End of Session - 11/26/23 1534     Visit Number 23    Number of Visits 35    Date for PT Re-Evaluation 01/24/24    Authorization Type [facility] [employer]    PT Start Time 1532    PT Stop Time 1614    PT Time Calculation (min) 42 min    Activity Tolerance Patient tolerated treatment well    Behavior During Therapy WFL for tasks assessed/performed                     Past Medical History:  Diagnosis Date   Anxiety    Anxiety with depression    Controlled.   Common migraine without aura    Diabetes mellitus type I, controlled (HCC)    Treated with 4 times daily insulin  based on blood sugar monitoring   Hip sprain    & contusion   Hyperlipidemia due to type 1 diabetes mellitus (HCC)    Currently on atorvastatin .   Hypothyroid    Lipoma    MVP (mitral valve prolapse) 2005   Renally diagnosed back in 2005 (during her pregnancy)_, most recently evaluated in 2012.  [facility] - Cardiology, Dr. Joanne Muckle); 11/2010 TTE: Moderate MR, posteriorly directed (recommend TEE) => TEE (01/2011) - mild MVP (anterior leaflet) with Mild-Mod MR.;    Past Surgical History:  Procedure Laterality Date   LAPAROSCOPIC LYSIS OF ADHESIONS     LIPOMA EXCISION Left    LUMBAR MICRODISCECTOMY     TRANSESOPHAGEAL ECHOCARDIOGRAM  02/13/2011   [facility] -> Dr. Joanne Muckle [facility]-Cardiology): EF 55 to 60%.  Borderline LA dilation.  Prolapse of A2 scallop of mitral valve-mild MVP with mild to moderate MR.  (2 eccentric jets, posteriorly directed, c/w anterior leaflet pathology))   TRANSTHORACIC ECHOCARDIOGRAM  12/11/2010   [facility] -> Dr. Joanne Muckle [facility]-Cardiology): EF 35 to 6%.  Mild LA dilation.  Moderate MR-eccentric/possibly directed jet consistent with  antilipid pathology.  Mild TR, LP HTN, mild PR.  Recommend TEE.   TREADMILL STRESS ECHOCARDIOGRAM     [facility] -> Dr. Joanne Muckle [facility]-Cardiology): Exercised 10 minutes (11 minutes), achieved Max Heart Rate 162 = 92% Max Predicted Heart Rate, clinically and electrocardiographically negative.  Nonspecific ST and T wave changes.  Prestress echo showed EF 55 to 60% with normal wall motion.  Post-Stress: EF 65 to 70% with nl augmentation of all walls.=> NORMAL STRESS [facility]   Patient Active Problem List   Diagnosis Date Noted   Osteoporosis 09/22/2023   Migraine headache 09/22/2023   Hypothyroidism 11/09/2021   Type 1 diabetes mellitus with hyperglycemia (HCC) 08/22/2020   History of mitral valve prolapse in adulthood 08/23/2010     REFERRING PROVIDER:  Wilhelmenia Harada, MD     REFERRING DIAG: M25.351 (ICD-10-CM) - Hip instability, right   Rationale for Evaluation and Treatment: Rehabilitation  THERAPY DIAG:  Difficulty in walking, not elsewhere classified  Abnormal posture  Pain in right hip  Muscle weakness (generalized)  Acute pain of right shoulder  ONSET DATE: 07/21/23   SUBJECTIVE:  SUBJECTIVE STATEMENT: MRI was read and there does not appear to be surgical need with the R shoulder. Pt would like to be able to use her shoulder more effectively with her summer plans sailing as well as for work as Teacher, early years/pre.   ERO: New referral for her shoulder pain is in today. In regards to her shoulder, she feels she has some underlying R shoulder arthritis with history of bursitis in her L shoulder. Thinks she may have had a touch of bursitis on her R shoulder even prior to her fall. During the fall, she did land on her R shoulder and overused it with using the crutches which  greatly irritated it. Did get steroid shot in shoulder 08/11/23. Still gets issues with putting on her shirt -- gets a shooting pain. Most bothered with driving and her arm will start hurting. Pt is right hand dominant so using it at work also makes it fatigued and sore. Feels that the pain is more generalized from the medial arm which can radiate down the forearms.    Eval: T1DM and maintain a good AI1. 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 I like to go sailing and skiing, enjoys trail walking around [facility]. Has 4 dogs and husband is an avid exerciser. I have had multiple LE fx but have been trauma-related. Bilat 5th mets, Rt ankle fx 2 locations. Has had back surgery at L4-5 discectomy. Jan 27th went on a family trip- skiing on new equipment- fell back, more on right side with bruise on Rt shoulder. I think the back of my skiis crossed and opened anteriorly and knees splayed out to strain groin muscles. Bindings did not release in fall. Hauled out by ski patrol and was in [facility]- ER xrays denied fx and sent me out on crutches.  After a week or 2 was not improving and tailbone felt like it was on fire. MRI showed bilat fractures, including sacrum. Did not miss a day at work and was unable to tolerate pain on walker. Developed tendonitis in right shoulder walking around the hospital on crutches. The 17th of Feb, Rt shoulder steroid injection. Discovered hip instability when I stepped into the tub-shower with right foot and felt something shift. When I rolled back to put my socks on, both hips shifted/popped/caught and had 10/10 pain and spent the day in bed. Dr B gave me a steroid shot in right hip on Monday and have had dramatic improvement. Was able to tolerate laying on her side for about 15 min last night. Walked short distances at home without RW.   PERTINENT HISTORY:  H/o ankle fx without surgery. T1DM Lt shoulder bursitis with PT in the past Injection to Rt shoulder recently for tendonitis   PAIN:   Are you having pain? Yes: NPRS scale: no pain at present, tightness in hamstrings Pain location: Rt hip, tailbone, Lateral Lt thigh sensation Pain description: I feel a sensation down lateral Lt thigh Aggravating factors: walking Relieving factors: reduced WB, injection  PRECAUTIONS:  None  RED FLAGS: None   WEIGHT BEARING RESTRICTIONS:  WBAT  FALLS:  Has patient fallen in last 6 months? Yes. 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  DIAGNOSTIC FINDINGS:  DG hip 08/14/23: Fractures of the bilateral puboacetabular junction, inferior pubic rami and right sacrum a MRI are not well-defined  by radiograph. The right inferior ramus fracture is potentially visualized, the other fractures are radiographically occult. There is no hip dislocation. No pubic symphyseal or sacroiliac diastasis.  PATIENT SURVEYS:  LEFS 18 09/25/23: 53/80  5/15: 57/80  COGNITIVE STATUS: Within functional limits for tasks assessed   SENSATION: WFL  POSTURE:  Standing: Rt shoulder depression, decreased thoracic kyphosis & lumbar lordosis, notable pelvic rotation Supine: Lt leg functionally shorter, Rt anterior innom  HAND DOMINANCE:  Right  GAIT: Eval: RW, slow cadence, short stride  UPPER EXTREMITY ROM:   Active ROM Right 10/07/23 Left 10/07/23  Shoulder flexion 165 158  Shoulder extension    Shoulder abduction 160 Wants to help bringing her arm down 160  Shoulder adduction    Shoulder extension    Shoulder internal rotation T7 with pain T4  Shoulder external rotation 45 with pain 54   (Blank rows = not tested)  UPPER EXTREMITY MMT:  MMT Right 10/07/23 Left 10/07/23  Shoulder flexion 5 5  Shoulder extension    Shoulder abduction 5 with pain 5  Shoulder adduction    Shoulder extension    Shoulder internal  rotation 5 5  Shoulder external rotation 5 5  Middle trapezius in prone 3+ with pain   Lower trapezius in prone 3- with pain    (Blank rows = not tested)  MMT (lb) Right 5/15 Left 5/15  Hip flexion    Hip extension    Hip abduction- sidelying 11.6 19.4  Hip adduction    Hip internal rotation    Hip external rotation    Knee flexion    Knee extension 50.0 52.4   (Blank rows = not tested)   TREATMENT DATE:   6/4  Imaging results and implications, current evidence on shoulder mechanics and graded exposure to activity   - Latissimus Dorsi Stretch at Wall   3 reps - 30 hold - Shoulder Abduction with Dumbbells -2lbs 2 sets - 10 reps - 3 hold - Single Arm Row on One Leg with Resistance  green TB -3 sets - 8 reps - Standing Shoulder Horizontal Abduction with Resistance  -yellow- 3 sets - 8 reps - Shoulder External Rotation and Scapular Retraction with Resistance   yellow   2 sets - 10 reps - 3 hold    5/29 Ab set- with feet wide, with bridge, with march Supine beginner scissors Lunges with yellow tband around knees Hip thrusters Hasmtring stretch, long sitting  5/27 Frog stretch Bird dog x10ea Qped Fire hydrants GTB 2x10ea S/l hip circles x10ea/bil Lateral step up 6" x10ea Single leg stance and with squat- green  tband at knees, chair for single UE support Wall squat with mini clam green tband 2x10 Squat to heel raise Lunges holding onto back of bike x10ea Standing march on airex (slow) x10ea   5/22: Squat with single LE medial anchor resist, green x15 and then a 10s hold Lateral runners step up Standing plank with resisted hip flexion Plank<>primal push up Seated on bosu- C sit with UE progressions flexion & abd, addition of march and trunk rotation    PATIENT EDUCATION:  Education details: rationale for interventions, HEP  Person educated: Patient Education method: Explanation, Demonstration, Tactile cues, Verbal cues Education comprehension: verbalized  understanding, returned demonstration, verbal cues required, tactile cues required, and needs further education     HOME EXERCISE PROGRAM: [id number] Hesch self correction for Rt anterior innominate rotation   ASSESSMENT:  CLINICAL IMPRESSION: Pt able to start progressive strengthening for the R  shoulder today with minimal discomfort. Shoulder testing suggests very minimal irritablity and sensitivity. HEP progressed with isometric holds to start and motions to simulate work and recreation activities. Plan to progress R shoulder strength for return to normal activity. MD does not suggest surgical intervention at this juncture.   Eval: Patient is a 58 y.o. F who was seen today for physical therapy evaluation and treatment for Rt hip pain following an incident on skiis that resulted in strained ER through bilateral LEs. Multiple pelvic fractures and strain through adductors, history of lumbar spine surgical intervention all contributing to pain. Pt does have known tearing in hip labrum but is feeling much better since the injection and would like to avoid surgery if possible.  Pt was very active prior to the accident and would like to return to PLOF. Pt also notes Rt shoulder pain onset since the fall- notable postural changes through thoracic spine creating biomechanical chain input from pelvis to shoulder.     REHAB POTENTIAL: Good  CLINICAL DECISION MAKING: Stable/uncomplicated  EVALUATION COMPLEXITY: Low   GOALS: Goals reviewed with patient? Yes  SHORT TERM GOALS: Target date: 3/22  Able to ambulate household distances, without AD, without antalgic pattern Baseline: Goal status: MET  2.  Demo proper core control with exercises Baseline:  Goal status: MET    LONG TERM GOALS: Target date: POC date  Hip strength 90% of left side Baseline: MMT to be performed as appropriate Goal status: ongoing  2.  Progressing into plyometric motions with minimal to no pain Baseline:  Goal  status: ongoing  3.  Return to work with understanding of exercises & rest breaks to reduce discomfort Baseline:  Goal status: ongoing- requires further progression  4.  Navigate uneven surfaces with minimal to no pain Baseline:  Goal status: partially met- able to ride boat with fatigue but no pain. Walked the trails at the healing gardens at [employer]  5.  Pt will be able to move R UE in all directions without pain for dressing and driving tasks Baseline:  Goal status: ongoing- f/u with MD   6.  Pt will demo at least 4/5 periscapular strength to demo increased shoulder stability and decreased nerve impingement Baseline:  Goal status: partially met- good strength but shoulder flares up quickly.      PLAN:  PT FREQUENCY: 1-2x/week  PT DURATION: 12 weeks  PLANNED INTERVENTIONS: 97164- PT Re-evaluation, 97110-Therapeutic exercises, 97530- Therapeutic activity, 97112- Neuromuscular re-education, 97535- Self Care, 16109- Manual therapy, [phone]- Gait training, [phone]- Aquatic Therapy, [phone]- Electrical stimulation (unattended), Patient/Family education, Balance training, Stair training, Taping, Dry Needling, Joint mobilization, Spinal mobilization, Cryotherapy, and Moist heat.  PLAN FOR NEXT SESSION: progressive R shoulder strength, Core and glute strength & control of LE rotation  Silver Dross PT, DPT 11/26/23 4:19 PM

## 2023-12-03 DIAGNOSIS — F4322 Adjustment disorder with anxiety: Secondary | ICD-10-CM | POA: Diagnosis not present

## 2023-12-05 ENCOUNTER — Ambulatory Visit (HOSPITAL_BASED_OUTPATIENT_CLINIC_OR_DEPARTMENT_OTHER): Admitting: Physical Therapy

## 2023-12-05 ENCOUNTER — Encounter (HOSPITAL_BASED_OUTPATIENT_CLINIC_OR_DEPARTMENT_OTHER): Payer: Self-pay | Admitting: Physical Therapy

## 2023-12-05 DIAGNOSIS — M25551 Pain in right hip: Secondary | ICD-10-CM

## 2023-12-05 DIAGNOSIS — R293 Abnormal posture: Secondary | ICD-10-CM | POA: Diagnosis not present

## 2023-12-05 DIAGNOSIS — M6281 Muscle weakness (generalized): Secondary | ICD-10-CM

## 2023-12-05 DIAGNOSIS — M25511 Pain in right shoulder: Secondary | ICD-10-CM | POA: Diagnosis not present

## 2023-12-05 DIAGNOSIS — R262 Difficulty in walking, not elsewhere classified: Secondary | ICD-10-CM | POA: Diagnosis not present

## 2023-12-05 NOTE — Therapy (Signed)
 OUTPATIENT PHYSICAL THERAPY TREATMENT  Patient Name: Maria Evans MRN: [id number] DOB:Jan 05, 1966, 58 y.o., female Today's Date: 12/05/2023  END OF SESSION:  PT End of Session - 12/05/23 0855     Visit Number 24    Number of Visits 35    Date for PT Re-Evaluation 01/24/24    Authorization Type [facility] [employer]    PT Start Time [phone]   late arrival   PT Stop Time 0930    PT Time Calculation (min) 38 min    Activity Tolerance Patient tolerated treatment well    Behavior During Therapy [facility] for tasks assessed/performed                   Past Medical History:  Diagnosis Date   Anxiety    Anxiety with depression    Controlled.   Common migraine without aura    Diabetes mellitus type I, controlled (HCC)    Treated with 4 times daily insulin  based on blood sugar monitoring   Hip sprain    & contusion   Hyperlipidemia due to type 1 diabetes mellitus (HCC)    Currently on atorvastatin .   Hypothyroid    Lipoma    MVP (mitral valve prolapse) 2005   Renally diagnosed back in 2005 (during her pregnancy)_, most recently evaluated in 2012.  [facility], Dr. Joanne Muckle); 11/2010 TTE: Moderate MR, posteriorly directed (recommend TEE) => TEE (01/2011) - mild MVP (anterior leaflet) with Mild-Mod MR.;    Past Surgical History:  Procedure Laterality Date   LAPAROSCOPIC LYSIS OF ADHESIONS     LIPOMA EXCISION Left    LUMBAR MICRODISCECTOMY     TRANSESOPHAGEAL ECHOCARDIOGRAM  02/13/2011   [facility] -> Dr. Joanne Muckle [facility]): EF 55 to 60%.  Borderline LA dilation.  Prolapse of A2 scallop of mitral valve-mild MVP with mild to moderate MR.  (2 eccentric jets, posteriorly directed, c/w anterior leaflet pathology))   TRANSTHORACIC ECHOCARDIOGRAM  12/11/2010   [facility] -> Dr. Joanne Muckle [facility]): EF 35 to 6%.  Mild LA dilation.  Moderate MR-eccentric/possibly directed jet  consistent with antilipid pathology.  Mild TR, LP HTN, mild PR.  Recommend TEE.   TREADMILL STRESS ECHOCARDIOGRAM     [facility] -> Dr. Joanne Muckle [facility]): Exercised 10 minutes (11 minutes), achieved Max Heart Rate 162 = 92% Max Predicted Heart Rate, clinically and electrocardiographically negative.  Nonspecific ST and T wave changes.  Prestress echo showed EF 55 to 60% with normal wall motion.  Post-Stress: EF 65 to 70% with nl augmentation of all walls.=> NORMAL STRESS [facility]   Patient Active Problem List   Diagnosis Date Noted   Osteoporosis 09/22/2023   Migraine headache 09/22/2023   Hypothyroidism 11/09/2021   Type 1 diabetes mellitus with hyperglycemia (HCC) 08/22/2020   History of mitral valve prolapse in adulthood 08/23/2010     REFERRING PROVIDER:  Wilhelmenia Harada, MD     REFERRING DIAG: M25.351 (ICD-10-CM) - Hip instability, right   Rationale for Evaluation and Treatment: Rehabilitation  THERAPY DIAG:  Difficulty in walking, not elsewhere classified  Abnormal posture  Pain in right hip  Muscle weakness (generalized)  Acute pain of right shoulder  ONSET DATE: 07/21/23   SUBJECTIVE:  SUBJECTIVE STATEMENT: Pt states that the shoulder was not sore or painful after last session. Pt does have to take 24 hrs between hip exercises due to increased soreness. She feels able to progress shoulder.   ERO: New referral for her shoulder pain is in today. In regards to her shoulder, she feels she has some underlying R shoulder arthritis with history of bursitis in her L shoulder. Thinks she may have had a touch of bursitis on her R shoulder even prior to her fall. During the fall, she did land on her R shoulder and overused it with using the crutches which greatly  irritated it. Did get steroid shot in shoulder 08/11/23. Still gets issues with putting on her shirt -- gets a shooting pain. Most bothered with driving and her arm will start hurting. Pt is right hand dominant so using it at work also makes it fatigued and sore. Feels that the pain is more generalized from the medial arm which can radiate down the forearms.    Eval: T1DM and maintain a good AI1. 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 I like to go sailing and skiing, enjoys trail walking around [facility]. Has 4 dogs and husband is an avid exerciser. I have had multiple LE fx but have been trauma-related. Bilat 5th mets, Rt ankle fx 2 locations. Has had back surgery at L4-5 discectomy. Jan 27th went on a family trip- skiing on new equipment- fell back, more on right side with bruise on Rt shoulder. I think the back of my skiis crossed and opened anteriorly and knees splayed out to strain groin muscles. Bindings did not release in fall. Hauled out by ski patrol and was in [facility]- ER xrays denied fx and sent me out on crutches.  After a week or 2 was not improving and tailbone felt like it was on fire. MRI showed bilat fractures, including sacrum. Did not miss a day at work and was unable to tolerate pain on walker. Developed tendonitis in right shoulder walking around the hospital on crutches. The 17th of Feb, Rt shoulder steroid injection. Discovered hip instability when I stepped into the tub-shower with right foot and felt something shift. When I rolled back to put my socks on, both hips shifted/popped/caught and had 10/10 pain and spent the day in bed. Dr B gave me a steroid shot in right hip on Monday and have had dramatic improvement. Was able to tolerate laying on her side for about 15 min last night. Walked short distances at home without RW.   PERTINENT HISTORY:  H/o ankle fx without surgery. T1DM Lt shoulder bursitis with PT in the past Injection to Rt shoulder recently for tendonitis   PAIN:  Are you  having pain? Yes: NPRS scale: no pain at present, tightness in hamstrings Pain location: Rt hip, tailbone, Lateral Lt thigh sensation Pain description: I feel a sensation down lateral Lt thigh Aggravating factors: walking Relieving factors: reduced WB, injection  PRECAUTIONS:  None  RED FLAGS: None   WEIGHT BEARING RESTRICTIONS:  WBAT  FALLS:  Has patient fallen in last 6 months? Yes. 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  DIAGNOSTIC FINDINGS:  DG hip 08/14/23: Fractures of the bilateral puboacetabular junction, inferior pubic rami and right sacrum a MRI are not well-defined by radiograph. The right inferior ramus fracture  is potentially visualized, the other fractures are radiographically occult. There is no hip dislocation. No pubic symphyseal or sacroiliac diastasis.  PATIENT SURVEYS:  LEFS 18 09/25/23: 53/80  5/15: 57/80  COGNITIVE STATUS: Within functional limits for tasks assessed   SENSATION: WFL  POSTURE:  Standing: Rt shoulder depression, decreased thoracic kyphosis & lumbar lordosis, notable pelvic rotation Supine: Lt leg functionally shorter, Rt anterior innom  HAND DOMINANCE:  Right  GAIT: Eval: RW, slow cadence, short stride  UPPER EXTREMITY ROM:   Active ROM Right 10/07/23 Left 10/07/23  Shoulder flexion 165 158  Shoulder extension    Shoulder abduction 160 Wants to help bringing her arm down 160  Shoulder adduction    Shoulder extension    Shoulder internal rotation T7 with pain T4  Shoulder external rotation 45 with pain 54   (Blank rows = not tested)  UPPER EXTREMITY MMT:  MMT Right 10/07/23 Left 10/07/23  Shoulder flexion 5 5  Shoulder extension    Shoulder abduction 5 with pain 5  Shoulder adduction    Shoulder extension    Shoulder internal rotation 5  5  Shoulder external rotation 5 5  Middle trapezius in prone 3+ with pain   Lower trapezius in prone 3- with pain    (Blank rows = not tested)  MMT (lb) Right 5/15 Left 5/15  Hip flexion    Hip extension    Hip abduction- sidelying 11.6 19.4  Hip adduction    Hip internal rotation    Hip external rotation    Knee flexion    Knee extension 50.0 52.4   (Blank rows = not tested)   TREATMENT DATE:   6/13  UBE rev/fwd 2 min each lvl 1 Table push up 4x6 2lb shoulder ABD full arc 2x10  Unilat cable row 3x8 20lbs  TRX deep squat 3x8 Farmers carry 25lbs 54ft 3x   6/4  Imaging results and implications, current evidence on shoulder mechanics and graded exposure to activity   - Latissimus Dorsi Stretch at Wall   3 reps - 30 hold - Shoulder Abduction with Dumbbells -2lbs 2 sets - 10 reps - 3 hold - Single Arm Row on One Leg with Resistance  green TB -3 sets - 8 reps - Standing Shoulder Horizontal Abduction with Resistance  -yellow- 3 sets - 8 reps - Shoulder External Rotation and Scapular Retraction with Resistance   yellow   2 sets - 10 reps - 3 hold    5/29 Ab set- with feet wide, with bridge, with march Supine beginner scissors Lunges with yellow tband around knees Hip thrusters Hasmtring stretch, long sitting  5/27 Frog stretch Bird dog x10ea Qped Fire hydrants GTB 2x10ea S/l hip circles x10ea/bil Lateral step up 6 x10ea Single leg stance and with squat- green  tband at knees, chair for single UE support Wall squat with mini clam green tband 2x10 Squat to heel raise Lunges holding onto back of bike x10ea Standing march on airex (slow) x10ea   5/22: Squat with single LE medial anchor resist, green x15 and then a 10s hold Lateral runners step up Standing plank with resisted hip flexion Plank<>primal push up Seated on bosu- C sit with UE progressions flexion & abd, addition of march and trunk rotation    PATIENT EDUCATION:  Education details: rationale  for interventions, HEP  Person educated: Patient Education method: Explanation, Demonstration, Tactile cues, Verbal cues Education comprehension: verbalized understanding, returned demonstration, verbal cues required, tactile cues required, and needs further education  HOME EXERCISE PROGRAM: [id number] Hesch self correction for Rt anterior innominate rotation   ASSESSMENT:  CLINICAL IMPRESSION: Pt able to progress functional LE and UE exercise today without pain. Only fatigue noted during session. Pt able to progress CKC and OKC exercise for UE and LE with good tolerance and minimal cuing. HEP updated and edu provided on return to gym, aquatic classes, and yoga classes. Pt advised to trial low intensity classes as well as start machine based exercise as she sees fit as there are less degrees of freedom vs freeweight or cables. Plan to continue with functional progression to return to gym. Consider weight squat, leg pressing, TRX rowing, and DL at future visits.   Eval: Patient is a 58 y.o. F who was seen today for physical therapy evaluation and treatment for Rt hip pain following an incident on skiis that resulted in strained ER through bilateral LEs. Multiple pelvic fractures and strain through adductors, history of lumbar spine surgical intervention all contributing to pain. Pt does have known tearing in hip labrum but is feeling much better since the injection and would like to avoid surgery if possible.  Pt was very active prior to the accident and would like to return to PLOF. Pt also notes Rt shoulder pain onset since the fall- notable postural changes through thoracic spine creating biomechanical chain input from pelvis to shoulder.     REHAB POTENTIAL: Good  CLINICAL DECISION MAKING: Stable/uncomplicated  EVALUATION COMPLEXITY: Low   GOALS: Goals reviewed with patient? Yes  SHORT TERM GOALS: Target date: 3/22  Able to ambulate household distances, without AD, without  antalgic pattern Baseline: Goal status: MET  2.  Demo proper core control with exercises Baseline:  Goal status: MET    LONG TERM GOALS: Target date: POC date  Hip strength 90% of left side Baseline: MMT to be performed as appropriate Goal status: ongoing  2.  Progressing into plyometric motions with minimal to no pain Baseline:  Goal status: ongoing  3.  Return to work with understanding of exercises & rest breaks to reduce discomfort Baseline:  Goal status: ongoing- requires further progression  4.  Navigate uneven surfaces with minimal to no pain Baseline:  Goal status: partially met- able to ride boat with fatigue but no pain. Walked the trails at the healing gardens at [employer]  5.  Pt will be able to move R UE in all directions without pain for dressing and driving tasks Baseline:  Goal status: ongoing- f/u with MD   6.  Pt will demo at least 4/5 periscapular strength to demo increased shoulder stability and decreased nerve impingement Baseline:  Goal status: partially met- good strength but shoulder flares up quickly.      PLAN:  PT FREQUENCY: 1-2x/week  PT DURATION: 12 weeks  PLANNED INTERVENTIONS: 97164- PT Re-evaluation, 97110-Therapeutic exercises, 97530- Therapeutic activity, 97112- Neuromuscular re-education, 97535- Self Care, 29562- Manual therapy, [phone]- Gait training, [phone]- Aquatic Therapy, [phone]- Electrical stimulation (unattended), Patient/Family education, Balance training, Stair training, Taping, Dry Needling, Joint mobilization, Spinal mobilization, Cryotherapy, and Moist heat.  PLAN FOR NEXT SESSION: progressive R shoulder strength, Core and glute strength & control of LE rotation  Silver Dross PT, DPT 12/05/23 9:37 AM

## 2023-12-10 ENCOUNTER — Ambulatory Visit (HOSPITAL_BASED_OUTPATIENT_CLINIC_OR_DEPARTMENT_OTHER): Admitting: Physical Therapy

## 2023-12-10 ENCOUNTER — Encounter (HOSPITAL_BASED_OUTPATIENT_CLINIC_OR_DEPARTMENT_OTHER): Payer: Self-pay | Admitting: Physical Therapy

## 2023-12-10 DIAGNOSIS — M25551 Pain in right hip: Secondary | ICD-10-CM | POA: Diagnosis not present

## 2023-12-10 DIAGNOSIS — M25511 Pain in right shoulder: Secondary | ICD-10-CM

## 2023-12-10 DIAGNOSIS — M6281 Muscle weakness (generalized): Secondary | ICD-10-CM

## 2023-12-10 DIAGNOSIS — R262 Difficulty in walking, not elsewhere classified: Secondary | ICD-10-CM | POA: Diagnosis not present

## 2023-12-10 DIAGNOSIS — R293 Abnormal posture: Secondary | ICD-10-CM | POA: Diagnosis not present

## 2023-12-10 NOTE — Therapy (Signed)
 OUTPATIENT PHYSICAL THERAPY TREATMENT  Patient Name: Maria Evans MRN: [id number] DOB:08/14/1965, 58 y.o., female Today's Date: 12/10/2023  END OF SESSION:  PT End of Session - 12/10/23 1320     Visit Number 25    Number of Visits 35    Date for PT Re-Evaluation 01/24/24    Authorization Type [facility] [employer]    PT Start Time 1316    PT Stop Time 1400    PT Time Calculation (min) 44 min    Activity Tolerance Patient tolerated treatment well    Behavior During Therapy WFL for tasks assessed/performed                    Past Medical History:  Diagnosis Date   Anxiety    Anxiety with depression    Controlled.   Common migraine without aura    Diabetes mellitus type I, controlled (HCC)    Treated with 4 times daily insulin  based on blood sugar monitoring   Hip sprain    & contusion   Hyperlipidemia due to type 1 diabetes mellitus (HCC)    Currently on atorvastatin .   Hypothyroid    Lipoma    MVP (mitral valve prolapse) 2005   Renally diagnosed back in 2005 (during her pregnancy)_, most recently evaluated in 2012.  [facility] - Cardiology, Dr. Joanne Muckle); 11/2010 TTE: Moderate MR, posteriorly directed (recommend TEE) => TEE (01/2011) - mild MVP (anterior leaflet) with Mild-Mod MR.;    Past Surgical History:  Procedure Laterality Date   LAPAROSCOPIC LYSIS OF ADHESIONS     LIPOMA EXCISION Left    LUMBAR MICRODISCECTOMY     TRANSESOPHAGEAL ECHOCARDIOGRAM  02/13/2011   [facility] -> Dr. Joanne Muckle [facility]Cardiology): EF 55 to 60%.  Borderline LA dilation.  Prolapse of A2 scallop of mitral valve-mild MVP with mild to moderate MR.  (2 eccentric jets, posteriorly directed, c/w anterior leaflet pathology))   TRANSTHORACIC ECHOCARDIOGRAM  12/11/2010   [facility] -> Dr. Joanne Muckle [facility]-Cardiology): EF 35 to 6%.  Mild LA dilation.  Moderate MR-eccentric/possibly directed jet consistent with  antilipid pathology.  Mild TR, LP HTN, mild PR.  Recommend TEE.   TREADMILL STRESS ECHOCARDIOGRAM     [facility] -> Dr. Joanne Muckle [facility]-Cardiology): Exercised 10 minutes (11 minutes), achieved Max Heart Rate 162 = 92% Max Predicted Heart Rate, clinically and electrocardiographically negative.  Nonspecific ST and T wave changes.  Prestress echo showed EF 55 to 60% with normal wall motion.  Post-Stress: EF 65 to 70% with nl augmentation of all walls.=> NORMAL STRESS [facility]   Patient Active Problem List   Diagnosis Date Noted   Osteoporosis 09/22/2023   Migraine headache 09/22/2023   Hypothyroidism 11/09/2021   Type 1 diabetes mellitus with hyperglycemia (HCC) 08/22/2020   History of mitral valve prolapse in adulthood 08/23/2010     REFERRING PROVIDER:  Wilhelmenia Harada, MD     REFERRING DIAG: M25.351 (ICD-10-CM) - Hip instability, right   Rationale for Evaluation and Treatment: Rehabilitation  THERAPY DIAG:  Difficulty in walking, not elsewhere classified  Abnormal posture  Pain in right hip  Muscle weakness (generalized)  Acute pain of right shoulder  ONSET DATE: 07/21/23   SUBJECTIVE:  SUBJECTIVE STATEMENT: Pt states she was mildly sore after last session but no increase in pain. Pt has been able workout in the gym with rehab exercise but has not returned to classes just yet.  ERO: New referral for her shoulder pain is in today. In regards to her shoulder, she feels she has some underlying R shoulder arthritis with history of bursitis in her L shoulder. Thinks she may have had a touch of bursitis on her R shoulder even prior to her fall. During the fall, she did land on her R shoulder and overused it with using the crutches which greatly irritated it. Did get steroid  shot in shoulder 08/11/23. Still gets issues with putting on her shirt -- gets a shooting pain. Most bothered with driving and her arm will start hurting. Pt is right hand dominant so using it at work also makes it fatigued and sore. Feels that the pain is more generalized from the medial arm which can radiate down the forearms.    Eval: T1DM and maintain a good AI1. 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 I like to go sailing and skiing, enjoys trail walking around [facility]. Has 4 dogs and husband is an avid exerciser. I have had multiple LE fx but have been trauma-related. Bilat 5th mets, Rt ankle fx 2 locations. Has had back surgery at L4-5 discectomy. Jan 27th went on a family trip- skiing on new equipment- fell back, more on right side with bruise on Rt shoulder. I think the back of my skiis crossed and opened anteriorly and knees splayed out to strain groin muscles. Bindings did not release in fall. Hauled out by ski patrol and was in [facility]- ER xrays denied fx and sent me out on crutches.  After a week or 2 was not improving and tailbone felt like it was on fire. MRI showed bilat fractures, including sacrum. Did not miss a day at work and was unable to tolerate pain on walker. Developed tendonitis in right shoulder walking around the hospital on crutches. The 17th of Feb, Rt shoulder steroid injection. Discovered hip instability when I stepped into the tub-shower with right foot and felt something shift. When I rolled back to put my socks on, both hips shifted/popped/caught and had 10/10 pain and spent the day in bed. Dr B gave me a steroid shot in right hip on Monday and have had dramatic improvement. Was able to tolerate laying on her side for about 15 min last night. Walked short distances at home without RW.   PERTINENT HISTORY:  H/o ankle fx without surgery. T1DM Lt shoulder bursitis with PT in the past Injection to Rt shoulder recently for tendonitis   PAIN:  Are you having pain? Yes: NPRS scale: no  pain at present, tightness in hamstrings Pain location: Rt hip, tailbone, Lateral Lt thigh sensation Pain description: I feel a sensation down lateral Lt thigh Aggravating factors: walking Relieving factors: reduced WB, injection  PRECAUTIONS:  None  RED FLAGS: None   WEIGHT BEARING RESTRICTIONS:  WBAT  FALLS:  Has patient fallen in last 6 months? Yes. 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  DIAGNOSTIC FINDINGS:  DG hip 08/14/23: Fractures of the bilateral puboacetabular junction, inferior pubic rami and right sacrum a MRI are not well-defined by radiograph. The right inferior ramus fracture is  potentially visualized, the other fractures are radiographically occult. There is no hip dislocation. No pubic symphyseal or sacroiliac diastasis.  PATIENT SURVEYS:  LEFS 18 09/25/23: 53/80  5/15: 57/80  COGNITIVE STATUS: Within functional limits for tasks assessed   SENSATION: WFL  POSTURE:  Standing: Rt shoulder depression, decreased thoracic kyphosis & lumbar lordosis, notable pelvic rotation Supine: Lt leg functionally shorter, Rt anterior innom  HAND DOMINANCE:  Right  GAIT: Eval: RW, slow cadence, short stride  UPPER EXTREMITY ROM:   Active ROM Right 10/07/23 Left 10/07/23  Shoulder flexion 165 158  Shoulder extension    Shoulder abduction 160 Wants to help bringing her arm down 160  Shoulder adduction    Shoulder extension    Shoulder internal rotation T7 with pain T4  Shoulder external rotation 45 with pain 54   (Blank rows = not tested)  UPPER EXTREMITY MMT:  MMT Right 10/07/23 Left 10/07/23  Shoulder flexion 5 5  Shoulder extension    Shoulder abduction 5 with pain 5  Shoulder adduction    Shoulder extension    Shoulder internal rotation 5 5  Shoulder external rotation 5  5  Middle trapezius in prone 3+ with pain   Lower trapezius in prone 3- with pain    (Blank rows = not tested)  MMT (lb) Right 5/15 Left 5/15  Hip flexion    Hip extension    Hip abduction- sidelying 11.6 19.4  Hip adduction    Hip internal rotation    Hip external rotation    Knee flexion    Knee extension 50.0 52.4   (Blank rows = not tested)   TREATMENT DATE:   6/18   UBE rev/fwd 2.5 min each lvl 1  TRX row 4x8 Cable DL/hip hinge 4x8 74.2VZD Shuttle leg press 125lbs 3x10 Side plank on elbows with knees bent 3x each 15s  Transition to personal training and self gym exercise, reduction of PT frequency,    6/13  UBE rev/fwd 2 min each lvl 1 Table push up 4x6 2lb shoulder ABD full arc 2x10  Unilat cable row 3x8 20lbs  TRX deep squat 3x8 Farmers carry 25lbs 52ft 3x   6/4  Imaging results and implications, current evidence on shoulder mechanics and graded exposure to activity   - Latissimus Dorsi Stretch at Wall   3 reps - 30 hold - Shoulder Abduction with Dumbbells -2lbs 2 sets - 10 reps - 3 hold - Single Arm Row on One Leg with Resistance  green TB -3 sets - 8 reps - Standing Shoulder Horizontal Abduction with Resistance  -yellow- 3 sets - 8 reps - Shoulder External Rotation and Scapular Retraction with Resistance   yellow   2 sets - 10 reps - 3 hold    5/29 Ab set- with feet wide, with bridge, with march Supine beginner scissors Lunges with yellow tband around knees Hip thrusters Hasmtring stretch, long sitting  5/27 Frog stretch Bird dog x10ea Qped Fire hydrants GTB 2x10ea S/l hip circles x10ea/bil Lateral step up 6 x10ea Single leg stance and with squat- green  tband at knees, chair for single UE support Wall squat with mini clam green tband 2x10 Squat to heel raise Lunges holding onto back of bike x10ea Standing march on airex (slow) x10ea   5/22: Squat with single LE medial anchor resist, green x15 and then a 10s hold Lateral  runners step up Standing plank with resisted hip flexion Plank<>primal push up Seated on bosu- C sit with UE progressions flexion &  abd, addition of march and trunk rotation    PATIENT EDUCATION:  Education details: rationale for interventions, HEP  Person educated: Patient Education method: Explanation, Demonstration, Tactile cues, Verbal cues Education comprehension: verbalized understanding, returned demonstration, verbal cues required, tactile cues required, and needs further education     HOME EXERCISE PROGRAM: [id number] Hesch self correction for Rt anterior innominate rotation   ASSESSMENT:  CLINICAL IMPRESSION: Pt HEP progressed to include CKC progressions as well as CKC shoulder stability without increasing pain or discomfort. HEP progressed to include more intensity of exercise. HEP frequency modified accordingly. Pt agreeable to dropping frequency of therapy sessions to 1x/every 3-4 wks and start transition back into [facility] with personal training. Plan for progress note at next session. Continue with functional strength progression as tolerated.  Eval: Patient is a 58 y.o. F who was seen today for physical therapy evaluation and treatment for Rt hip pain following an incident on skiis that resulted in strained ER through bilateral LEs. Multiple pelvic fractures and strain through adductors, history of lumbar spine surgical intervention all contributing to pain. Pt does have known tearing in hip labrum but is feeling much better since the injection and would like to avoid surgery if possible.  Pt was very active prior to the accident and would like to return to PLOF. Pt also notes Rt shoulder pain onset since the fall- notable postural changes through thoracic spine creating biomechanical chain input from pelvis to shoulder.     REHAB POTENTIAL: Good  CLINICAL DECISION MAKING: Stable/uncomplicated  EVALUATION COMPLEXITY: Low   GOALS: Goals reviewed with patient?  Yes  SHORT TERM GOALS: Target date: 3/22  Able to ambulate household distances, without AD, without antalgic pattern Baseline: Goal status: MET  2.  Demo proper core control with exercises Baseline:  Goal status: MET    LONG TERM GOALS: Target date: POC date  Hip strength 90% of left side Baseline: MMT to be performed as appropriate Goal status: ongoing  2.  Progressing into plyometric motions with minimal to no pain Baseline:  Goal status: ongoing  3.  Return to work with understanding of exercises & rest breaks to reduce discomfort Baseline:  Goal status: ongoing- requires further progression  4.  Navigate uneven surfaces with minimal to no pain Baseline:  Goal status: partially met- able to ride boat with fatigue but no pain. Walked the trails at the healing gardens at [employer]  5.  Pt will be able to move R UE in all directions without pain for dressing and driving tasks Baseline:  Goal status: ongoing- f/u with MD   6.  Pt will demo at least 4/5 periscapular strength to demo increased shoulder stability and decreased nerve impingement Baseline:  Goal status: partially met- good strength but shoulder flares up quickly.      PLAN:  PT FREQUENCY: 1-2x/week  PT DURATION: 12 weeks  PLANNED INTERVENTIONS: 97164- PT Re-evaluation, 97110-Therapeutic exercises, 97530- Therapeutic activity, 97112- Neuromuscular re-education, 97535- Self Care, 54098- Manual therapy, [phone]- Gait training, [phone]- Aquatic Therapy, [phone]- Electrical stimulation (unattended), Patient/Family education, Balance training, Stair training, Taping, Dry Needling, Joint mobilization, Spinal mobilization, Cryotherapy, and Moist heat.  PLAN FOR NEXT SESSION: progressive R shoulder strength, Core and glute strength & control of LE rotation  Silver Dross PT, DPT 12/10/23 2:08 PM

## 2023-12-12 ENCOUNTER — Other Ambulatory Visit: Payer: Self-pay | Admitting: Internal Medicine

## 2023-12-12 ENCOUNTER — Other Ambulatory Visit (HOSPITAL_COMMUNITY): Payer: Self-pay

## 2023-12-12 ENCOUNTER — Other Ambulatory Visit: Payer: Self-pay

## 2023-12-12 MED ORDER — LYUMJEV KWIKPEN 100 UNIT/ML ~~LOC~~ SOPN
4.0000 [IU] | PEN_INJECTOR | Freq: Three times a day (TID) | SUBCUTANEOUS | 11 refills | Status: DC
  Filled 2023-12-12: qty 15, 50d supply, fill #0
  Filled 2024-02-20: qty 15, 50d supply, fill #1
  Filled 2024-04-11: qty 15, 50d supply, fill #2
  Filled 2024-06-19: qty 15, 50d supply, fill #3

## 2023-12-16 ENCOUNTER — Encounter (HOSPITAL_BASED_OUTPATIENT_CLINIC_OR_DEPARTMENT_OTHER): Admitting: Physical Therapy

## 2023-12-19 ENCOUNTER — Other Ambulatory Visit: Payer: Self-pay

## 2023-12-25 ENCOUNTER — Encounter (HOSPITAL_BASED_OUTPATIENT_CLINIC_OR_DEPARTMENT_OTHER)

## 2023-12-31 ENCOUNTER — Encounter (HOSPITAL_BASED_OUTPATIENT_CLINIC_OR_DEPARTMENT_OTHER): Admitting: Physical Therapy

## 2024-01-01 ENCOUNTER — Ambulatory Visit: Payer: Commercial Managed Care - PPO | Admitting: Internal Medicine

## 2024-01-01 ENCOUNTER — Encounter: Payer: Self-pay | Admitting: Internal Medicine

## 2024-01-01 VITALS — BP 118/72 | HR 69 | Ht 68.75 in | Wt 155.6 lb

## 2024-01-01 DIAGNOSIS — E039 Hypothyroidism, unspecified: Secondary | ICD-10-CM | POA: Diagnosis not present

## 2024-01-01 DIAGNOSIS — E1065 Type 1 diabetes mellitus with hyperglycemia: Secondary | ICD-10-CM | POA: Diagnosis not present

## 2024-01-01 LAB — POCT GLYCOSYLATED HEMOGLOBIN (HGB A1C): Hemoglobin A1C: 6.2 % — AB (ref 4.0–5.6)

## 2024-01-01 MED ORDER — INJECTION DEVICE FOR INSULIN DEVI: 1.0000 | Freq: Once | Status: AC

## 2024-01-01 NOTE — Patient Instructions (Addendum)
 Please continue: - Tresiba  11 units at bedtime - Lyumjev  4-10 units 3 times a day before meals + correction  - 1:10 ICR, target 120, ISF 60-65  (- 1:8 ICR with carbs/starches) Try to use a lower Lyumjev  dose when you plan to be active after a meal (e.g. ICR of 1:12) or skip it completely if you have a small meal.  Resume: - NPH 1 unit for 15 g carbs before dinner  Do not correct hyperglycemia with more than 1 unit. Do not correct hypoglycemia with more than 2 Garrel and Ike's.  Please continue Levothyroxine  100 mcg daily.  Take the thyroid  hormone every day, with water, at least 30 minutes before breakfast, separated by at least 4 hours from: - acid reflux medications - calcium  - iron - multivitamins  Please return in 6 months (before 07/02/2024).
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
## 2024-01-06 ENCOUNTER — Encounter: Payer: Self-pay | Admitting: Internal Medicine

## 2024-01-07 ENCOUNTER — Ambulatory Visit (HOSPITAL_BASED_OUTPATIENT_CLINIC_OR_DEPARTMENT_OTHER): Attending: Orthopaedic Surgery | Admitting: Physical Therapy

## 2024-01-07 ENCOUNTER — Encounter (HOSPITAL_BASED_OUTPATIENT_CLINIC_OR_DEPARTMENT_OTHER): Payer: Self-pay | Admitting: Physical Therapy

## 2024-01-07 DIAGNOSIS — R262 Difficulty in walking, not elsewhere classified: Secondary | ICD-10-CM | POA: Diagnosis not present

## 2024-01-07 DIAGNOSIS — M25551 Pain in right hip: Secondary | ICD-10-CM | POA: Diagnosis not present

## 2024-01-07 DIAGNOSIS — M6281 Muscle weakness (generalized): Secondary | ICD-10-CM | POA: Insufficient documentation

## 2024-01-07 DIAGNOSIS — M25511 Pain in right shoulder: Secondary | ICD-10-CM | POA: Diagnosis not present

## 2024-01-07 DIAGNOSIS — R293 Abnormal posture: Secondary | ICD-10-CM | POA: Insufficient documentation

## 2024-01-07 NOTE — Therapy (Signed)
 OUTPATIENT PHYSICAL THERAPY TREATMENT  PHYSICAL THERAPY DISCHARGE SUMMARY  Visits from Start of Care: 26   Plan: Patient agrees to discharge.  Patient goals were not met. Patient is being discharged due to meeting the stated rehab goals.        Patient Name: Maria Evans MRN: [id number] DOB:1966-06-21, 58 y.o., female Today's Date: 01/07/2024  END OF SESSION:  PT End of Session - 01/07/24 1628     Visit Number 26    Number of Visits 35    Date for PT Re-Evaluation 01/24/24    Authorization Type [facility] [employer]    PT Start Time 1617    PT Stop Time 1645    PT Time Calculation (min) 28 min    Activity Tolerance Patient tolerated treatment well    Behavior During Therapy WFL for tasks assessed/performed                     Past Medical History:  Diagnosis Date   Anxiety    Anxiety with depression    Controlled.   Common migraine without aura    Diabetes mellitus type I, controlled (HCC)    Treated with 4 times daily insulin  based on blood sugar monitoring   Hip sprain    & contusion   Hyperlipidemia due to type 1 diabetes mellitus (HCC)    Currently on atorvastatin .   Hypothyroid    Lipoma    MVP (mitral valve prolapse) 2005   Renally diagnosed back in 2005 (during her pregnancy)_, most recently evaluated in 2012.  [facility], Dr. Robinson); 11/2010 TTE: Moderate MR, posteriorly directed (recommend TEE) => TEE (01/2011) - mild MVP (anterior leaflet) with Mild-Mod MR.;    Past Surgical History:  Procedure Laterality Date   LAPAROSCOPIC LYSIS OF ADHESIONS     LIPOMA EXCISION Left    LUMBAR MICRODISCECTOMY     TRANSESOPHAGEAL ECHOCARDIOGRAM  02/13/2011   [facility] -> Dr. Robinson [facility]): EF 55 to 60%.  Borderline LA dilation.  Prolapse of A2 scallop of mitral valve-mild MVP with mild to moderate MR.  (2 eccentric jets, posteriorly directed, c/w anterior leaflet  pathology))   TRANSTHORACIC ECHOCARDIOGRAM  12/11/2010   [facility] -> Dr. Robinson [facility]): EF 35 to 6%.  Mild LA dilation.  Moderate MR-eccentric/possibly directed jet consistent with antilipid pathology.  Mild TR, LP HTN, mild PR.  Recommend TEE.   TREADMILL STRESS ECHOCARDIOGRAM     [facility] -> Dr. Robinson [facility]): Exercised 10 minutes (11 minutes), achieved Max Heart Rate 162 = 92% Max Predicted Heart Rate, clinically and electrocardiographically negative.  Nonspecific ST and T wave changes.  Prestress echo showed EF 55 to 60% with normal wall motion.  Post-Stress: EF 65 to 70% with nl augmentation of all walls.=> NORMAL STRESS [facility]   Patient Active Problem List   Diagnosis Date Noted   Osteoporosis 09/22/2023   Migraine headache 09/22/2023   Hypothyroidism 11/09/2021   Type 1 diabetes mellitus with hyperglycemia (HCC) 08/22/2020   History of mitral valve prolapse in adulthood 08/23/2010     REFERRING PROVIDER:  Genelle Standing, MD     REFERRING DIAG: M25.351 (ICD-10-CM) - Hip instability, right   Rationale for Evaluation and Treatment: Rehabilitation  THERAPY DIAG:  Difficulty in walking, not elsewhere classified  Abnormal posture  Pain in right hip  Acute pain of right shoulder  Muscle weakness (generalized)  ONSET DATE: 07/21/23   SUBJECTIVE:  SUBJECTIVE STATEMENT: Pt reports she has gone back to some workout classes, short hikes,  and walking/jogging (4x 1 min intervals). Pt has been able to go back to sailing. Pt is 90-95% back to normal at LE. 85-90% at shoulder normal.    PERTINENT HISTORY:  H/o ankle fx without surgery. T1DM Lt shoulder bursitis with PT in the past Injection to Rt shoulder  recently for tendonitis   PAIN:  Are you having pain? Yes: NPRS scale: no pain at present, tightness in hamstrings Pain location: Rt hip, tailbone, Lateral Lt thigh sensation Pain description: I feel a sensation down lateral Lt thigh Aggravating factors: walking Relieving factors: reduced WB, injection  PRECAUTIONS:  None  RED FLAGS: None   WEIGHT BEARING RESTRICTIONS:  WBAT  FALLS:  Has patient fallen in last 6 months? Yes. 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  DIAGNOSTIC FINDINGS:  DG hip 08/14/23: Fractures of the bilateral puboacetabular junction, inferior pubic rami and right sacrum a MRI are not well-defined by radiograph. The right inferior ramus fracture is potentially visualized, the other fractures are radiographically occult. There is no hip dislocation. No pubic symphyseal or sacroiliac diastasis.  PATIENT SURVEYS:  LEFS 18 09/25/23: 53/80  5/15: 57/80  7/16: LEFS 79/80  UEFS: 76/80   HAND DOMINANCE:  Right  GAIT: WNL  UPPER EXTREMITY MMT:  MMT Right 10/07/23 R 7/16 Left 10/07/23 L 7/16  Shoulder flexion 5 27.0 5 26.7  Shoulder extension      Shoulder abduction 5 with pain 28.9 5 24.3  Shoulder adduction      Shoulder extension      Shoulder internal rotation 5  5   Shoulder external rotation 5  5   Middle trapezius in prone 3+ with pain 20.5  21.3  Lower trapezius in prone 3- with pain      (Blank rows = not tested)  MMT (lb) Right 5/15 R 7/16 Left 5/15 L 7/16  Hip flexion      Hip extension      Hip abduction- sidelying 11.6 40.9 19.4 44.4  Hip adduction      Hip internal rotation      Hip external rotation      Knee flexion      Knee extension 50.0 63.8 52.4 52.1   (Blank rows = not tested)   TREATMENT DATE:  7/16  Edu of exam results,  progression/regression, safety with future exercise, personal training reccs, HEP review   Exercises - Latissimus Dorsi Stretch at Wall  - 2 x daily - 7 x weekly - 1 sets - 3 reps - 30 hold - Push Up on Table  - 1 x daily - 3 x weekly - 3 sets - 10 reps - shoulder Abduction (palms down)  - 1 x daily - 3 x weekly - 2 sets - 10 reps - Standing Shoulder Horizontal Abduction with Resistance  - 1 x daily - 3 x weekly - 3 sets - 8 reps - Seated Row Cable Machine  - 1 x daily - 3 x weekly - 3 sets - 8 reps - Shoulder External Rotation and Scapular Retraction with Resistance  - 1 x daily - 3 x weekly - 2 sets - 10 reps - 3 hold - Supine Hamstring Stretch with Strap  - 1 x daily -  7 x weekly - 2 sets - 5 breaths hold - Seated Hamstring Stretch  - Seated Figure 4 Piriformis Stretch  - 2-3 x daily - 7 x weekly - 2 sets - 5 breaths hold - Squat with TRX  - 1 x daily - 3 x weekly - 3 sets - 8 reps - Goblet Squat with Kettlebell  - 1 x daily - 3 x weekly - 3 sets - 8 reps - Standard Lunge  - 1 x daily - 3 x weekly - 1-3 sets - 5-10 reps - Lateral Step Down  - 1 x daily - 3 x weekly - 3 sets - 10 reps - Primal Push Up  - 1 x daily - 3 x weekly - 3 sets - 10 reps - Runner's Climb  - 1 x daily - 3 x weekly - 3 sets - 10 reps - Side Stepping with Resistance at Thighs  - 1 x daily - 3 x weekly - 3 sets - 10 reps - Marching Bridge  - 1 x daily - 3 x weekly - 3 sets - 10 reps - Beginner Scissor  - 1 x daily - 3 x weekly - 3 sets - 10 reps - Supine Hip Extension on Bench  - 1 x daily - 3 x weekly - 3 sets - 10 reps - Kettlebell Deadlift  - 1 x daily - 3 x weekly - 4 sets - 8 reps - Farmer's Carry with Kettlebells  - 1 x daily - 3 x weekly - 1 sets - 3 reps - 108ft hold - Inverted Row with TRX  - 1 x daily - 3 x weekly - 4 sets - 8 reps - Side Plank on Knees  - 1 x daily - 3 x weekly - 2 sets - 3 reps - 10-15 hold  6/18   UBE rev/fwd 2.5 min each lvl 1  TRX row 4x8 Cable DL/hip hinge 4x8 77.4oad Shuttle  leg press 125lbs 3x10 Side plank on elbows with knees bent 3x each 15s  Transition to personal training and self gym exercise, reduction of PT frequency,    6/13  UBE rev/fwd 2 min each lvl 1 Table push up 4x6 2lb shoulder ABD full arc 2x10  Unilat cable row 3x8 20lbs  TRX deep squat 3x8 Farmers carry 25lbs 62ft 3x   6/4  Imaging results and implications, current evidence on shoulder mechanics and graded exposure to activity   - Latissimus Dorsi Stretch at Wall   3 reps - 30 hold - Shoulder Abduction with Dumbbells -2lbs 2 sets - 10 reps - 3 hold - Single Arm Row on One Leg with Resistance  green TB -3 sets - 8 reps - Standing Shoulder Horizontal Abduction with Resistance  -yellow- 3 sets - 8 reps - Shoulder External Rotation and Scapular Retraction with Resistance   yellow   2 sets - 10 reps - 3 hold    5/29 Ab set- with feet wide, with bridge, with march Supine beginner scissors Lunges with yellow tband around knees Hip thrusters Hasmtring stretch, long sitting  5/27 Frog stretch Bird dog x10ea Qped Fire hydrants GTB 2x10ea S/l hip circles x10ea/bil Lateral step up 6 x10ea Single leg stance and with squat- green  tband at knees, chair for single UE support Wall squat with mini clam green tband 2x10 Squat to heel raise Lunges holding onto back of bike x10ea Standing march on airex (slow) x10ea   5/22: Squat with single LE medial anchor resist,  green x15 and then a 10s hold Lateral runners step up Standing plank with resisted hip flexion Plank<>primal push up Seated on bosu- C sit with UE progressions flexion & abd, addition of march and trunk rotation    PATIENT EDUCATION:  Education details: rationale for interventions, HEP  Person educated: Patient Education method: Explanation, Demonstration, Tactile cues, Verbal cues Education comprehension: verbalized understanding, returned demonstration, verbal cues required, tactile cues required, and needs  further education     HOME EXERCISE PROGRAM: [id number] Hesch self correction for Rt anterior innominate rotation   ASSESSMENT:  CLINICAL IMPRESSION: Pt has improved significantly as demonstrated above by subjective and objective measures. Pt is back to near full activity and has understanding of self progression. All goals met with therapy at this time. D/C to self guided exercise and referral to be placed with personal training.   REHAB POTENTIAL: Good  CLINICAL DECISION MAKING: Stable/uncomplicated  EVALUATION COMPLEXITY: Low   GOALS: Goals reviewed with patient? Yes  SHORT TERM GOALS: Target date: 3/22  Able to ambulate household distances, without AD, without antalgic pattern Baseline: Goal status: MET  2.  Demo proper core control with exercises Baseline:  Goal status: MET    LONG TERM GOALS: Target date: POC date  Hip strength 90% of left side Baseline: MMT to be performed as appropriate Goal status: MET  2.  Progressing into plyometric motions with minimal to no pain Baseline:  Goal status: MET  3.  Return to work with understanding of exercises & rest breaks to reduce discomfort Baseline:  Goal status:MET  4.  Navigate uneven surfaces with minimal to no pain Baseline:  Goal status: pMET  5.  Pt will be able to move R UE in all directions without pain for dressing and driving tasks Baseline:  Goal status: MET  6.  Pt will demo at least 4/5 periscapular strength to demo increased shoulder stability and decreased nerve impingement Baseline:  Goal status: MET     PLAN:  PT FREQUENCY: 1-2x/week  PT DURATION: 12 weeks  PLANNED INTERVENTIONS: 97164- PT Re-evaluation, 97110-Therapeutic exercises, 97530- Therapeutic activity, 97112- Neuromuscular re-education, 97535- Self Care, [id number]- Manual therapy, [phone]- Gait training, [phone]- Aquatic Therapy, [phone]- Electrical stimulation (unattended), Patient/Family education, Balance training, Stair training,  Taping, Dry Needling, Joint mobilization, Spinal mobilization, Cryotherapy, and Moist heat.   Dale Call PT, DPT 01/07/24 4:54 PM

## 2024-01-14 ENCOUNTER — Encounter (HOSPITAL_BASED_OUTPATIENT_CLINIC_OR_DEPARTMENT_OTHER): Admitting: Physical Therapy

## 2024-01-15 LAB — HM DIABETES EYE EXAM

## 2024-01-20 ENCOUNTER — Other Ambulatory Visit (HOSPITAL_COMMUNITY): Payer: Self-pay

## 2024-01-20 ENCOUNTER — Other Ambulatory Visit: Payer: Self-pay | Admitting: Internal Medicine

## 2024-01-20 MED ORDER — TRESIBA FLEXTOUCH 100 UNIT/ML ~~LOC~~ SOPN
13.0000 [IU] | PEN_INJECTOR | Freq: Every day | SUBCUTANEOUS | 3 refills | Status: DC
  Filled 2024-01-20: qty 9, 37d supply, fill #0
  Filled 2024-04-11: qty 9, 37d supply, fill #1
  Filled 2024-05-21: qty 9, 37d supply, fill #2

## 2024-01-20 NOTE — Telephone Encounter (Signed)
 Refill request complete

## 2024-01-22 ENCOUNTER — Other Ambulatory Visit (HOSPITAL_COMMUNITY): Payer: Self-pay

## 2024-01-22 ENCOUNTER — Encounter (HOSPITAL_BASED_OUTPATIENT_CLINIC_OR_DEPARTMENT_OTHER): Admitting: Physical Therapy

## 2024-01-22 DIAGNOSIS — F4322 Adjustment disorder with anxiety: Secondary | ICD-10-CM | POA: Diagnosis not present

## 2024-01-23 ENCOUNTER — Other Ambulatory Visit (HOSPITAL_COMMUNITY): Payer: Self-pay

## 2024-01-23 ENCOUNTER — Other Ambulatory Visit: Payer: Self-pay

## 2024-01-23 MED ORDER — LISINOPRIL 2.5 MG PO TABS
2.5000 mg | ORAL_TABLET | Freq: Every day | ORAL | 0 refills | Status: AC
  Filled 2024-01-23: qty 90, 90d supply, fill #0

## 2024-01-23 MED ORDER — ESCITALOPRAM OXALATE 20 MG PO TABS
20.0000 mg | ORAL_TABLET | Freq: Every day | ORAL | 0 refills | Status: AC
  Filled 2024-01-23: qty 90, 90d supply, fill #0

## 2024-01-27 ENCOUNTER — Ambulatory Visit: Attending: Cardiology | Admitting: Cardiology

## 2024-01-27 ENCOUNTER — Encounter: Payer: Self-pay | Admitting: Cardiology

## 2024-01-27 VITALS — BP 109/66 | HR 66 | Ht 68.5 in | Wt 152.6 lb

## 2024-01-27 DIAGNOSIS — R002 Palpitations: Secondary | ICD-10-CM | POA: Insufficient documentation

## 2024-01-27 DIAGNOSIS — Z8679 Personal history of other diseases of the circulatory system: Secondary | ICD-10-CM

## 2024-01-27 NOTE — Progress Notes (Unsigned)
 Cardiology Office Note:  .   Date:  01/29/2024  ID:  SARIKA Evans, DOB Oct 12, 1965, MRN [id number] PCP: Rolinda Millman, MD  [facility] Providers Cardiologist:  Alm Clay, MD { Click to update primary MD,subspecialty MD or APP then REFRESH:1}    Chief Complaint  Patient presents with   New Patient (Initial Visit)   Palpitations    Recurrent palpitations.  Has history of mild MVP    Patient Profile: .     Maria Evans is a  58 y.o. female  with a PMH notable for DM-1, and report of potential mitral prolapse who presents here for establish cardiology care to evaluate palpitations and mitral valve disease.  At the request of Rolinda Millman, MD.  {There is no content from the last Narrative History section.}      Maria Evans was last seen on ***  Subjective  Discussed the use of AI scribe software for clinical note transcription with the patient, who gave verbal consent to proceed.  History of Present Illness  Maria Evans is a 57 year old female with reported history of mitral valve prolapse who presents with palpitations.  In March, she experienced episodes of a fast heartbeat, waking her up at night with a heart rate of 116 bpm. She managed these episodes with water, breathing exercises, and reducing caffeine intake. These episodes occurred multiple times that week, prompting her to follow up with her doctor. She has a history of mitral valve prolapse, last evaluated in 2022, and reports occasional palpitations but no recent episodes waking her up at night.  In January, she had a skiing accident resulting in a fractured pelvis and a torn labrum in her hips. Initially, X-rays did not show fractures, but an MRI later confirmed multiple fractures and hip instability. She underwent multiple steroid injections and physical therapy, and by April, she had weaned off oxycodone , which she was taking along with Tylenol  Extra Strength and Motrin for pain management. She  was released from physical therapy.  She is a type 1 diabetic, well-controlled with a recent A1C of 6.2, and is on atorvastatin  for cholesterol management. She takes lisinopril  2.5 mg for renal protection and Synthroid  for hypothyroidism. She occasionally uses Xanax , about once a month, as needed.  Her family history includes a twin with similar cardiac issues, who is on carvedilol. She works as a Teacher, early years/pre at [employer].     Objective   Past Medical History - Pelvic fractures - Hip labral tear with instability - Mitral valve prolapse with mild regurgitation - Type 1 diabetes mellitus - Hypothyroidism  Pertinent Medications - Xanax  (as needed, approximately once a month) - Atorvastatin  20 mg daily - Low-dose Lisinopril  2.5 mg daily - Synthroid  100 mcg daily - Lexapro  20 mg daily; Tresiba  13 units daily and 11 units bedtime lispro insulin  4-10 mg as directed TID for correction and 2 to 4 units NPH daily in the afternoon.  Social History - Alcohol: Occasional use, had wine with dinner during a celebration. - Employment: Teacher, early years/pre at [employer] - Partner Status: Married   Studies Reviewed: Maria   EKG Interpretation Date/Time:  Tuesday January 27 2024 09:24:25 EDT Ventricular Rate:  66 PR Interval:  174 QRS Duration:  86 QT Interval:  412 QTC Calculation: 431 R Axis:   83  Text Interpretation: Normal sinus rhythm Septal infarct , age undetermined No previous ECGs available Confirmed by Clay Alm ([id number]) on 01/27/2024 9:50:03 AM    Lab Results  Component Value Date   NA 141 07/03/2023   K 4.4 07/03/2023   CREATININE 0.79 07/03/2023   EGFR 88 07/03/2023   GLUCOSE 112 (H) 07/03/2023   Lab Results  Component Value Date   CHOL 200 (H) 07/03/2023   HDL 90 07/03/2023   LDLCALC 93 07/03/2023   TRIG 76 07/03/2023   CHOLHDL 2.2 07/03/2023   Lab Results  Component Value Date   TSH 1.16 07/03/2023    ECHO: (08/2020)  1. Left ventricular ejection fraction, by  estimation, is 50 to 55%. The left ventricle has low normal function. The left ventricle has no regional  wall motion abnormalities. Left ventricular diastolic parameters were normal.   2. Right ventricular systolic function is normal. The right ventricular size is normal. There is normal pulmonary artery systolic pressure. The  estimated right ventricular systolic pressure is 25.5 mmHg.   3. The mitral valve is abnormal. Mild prolapse of anterior leaflet. Trivial mitral valve regurgitation.   4. The aortic valve is tricuspid. Aortic valve regurgitation is not visualized. No aortic stenosis is present.   5. The inferior vena cava is normal in size with <50% respiratory variability, suggesting right atrial pressure of 8 mmHg.   Risk Assessment/Calculations:         Physical Exam:   VS:  BP 109/66 (BP Location: Left Arm, Patient Position: Sitting, Cuff Size: Normal)   Pulse 66   Ht 5' 8.5 (1.74 m)   Wt 152 lb 9.6 oz (69.2 kg)   LMP 02/07/2016   SpO2 96%   BMI 22.87 kg/m    Wt Readings from Last 3 Encounters:  01/27/24 152 lb 9.6 oz (69.2 kg)  01/01/24 155 lb 9.6 oz (70.6 kg)  09/22/23 149 lb (67.6 kg)    GEN: Well nourished, well groomed in no acute distress; healthy-appearing. NECK: No JVD; No carotid bruits CARDIAC: Normal S1, S2; RRR, no murmur but cannot exclude soft systolic click.  No rubs or gallops.  RESPIRATORY:  Clear to auscultation without rales, wheezing or rhonchi ; nonlabored, good air movement. ABDOMEN: Soft, non-tender, non-distended EXTREMITIES:  No edema; No deformity      ASSESSMENT AND PLAN: .    Problem List Items Addressed This Visit     History of mitral valve prolapse in adulthood - Primary (Chronic)   Mild prolapse and regurgitation, no significant murmur or worsening expected. Potential for leaflet billowing and regurgitation over time. - Order echocardiogram to assess mitral valve status.      Relevant Orders   EKG 12-Lead (Completed)    ECHOCARDIOGRAM COMPLETE   Palpitations (Chronic)   Paroxysmal palpitations (sinus tachycardia and PVCs) Episodes of sinus tachycardia and PVCs, likely stress-related. No significant symptoms. Beta blockers not recommended due to low blood pressure and diabetes. - Monitor palpitations and consider vagal maneuvers for symptomatic relief. - Avoid caffeine and ensure adequate hydration. - Use Apple Watch to record rhythm if palpitations occur. - Consider follow-up if palpitations become more frequent or severe.      Relevant Orders   ECHOCARDIOGRAM COMPLETE            Follow-Up: Return in about 2 years (around 01/26/2026) for 2 Yr Follow-up; last follow-up echocardiogram is worse.Maria Fus, Alm MICAEL Clay, MD, MS Alm Clay, M.D., M.S. Interventional Chartered certified accountant  Pager # [phone]

## 2024-01-27 NOTE — Patient Instructions (Signed)
 Medication Instructions:   No changes *If you need a refill on your cardiac medications before your next appointment, please call your pharmacy*   Lab Work: Not needed    Testing/Procedures: Your physician has requested that you have an echocardiogram. Echocardiography is a painless test that uses sound waves to create images of your heart. It provides your doctor with information about the size and shape of your heart and how well your heart's chambers and valves are working. This procedure takes approximately one hour. There are no restrictions for this procedure. Please do NOT wear cologne, perfume, aftershave, or lotions (deodorant is allowed). Please arrive 15 minutes prior to your appointment time.  Please note: We ask at that you not bring children with you during ultrasound (echo/ vascular) testing. Due to room size and safety concerns, children are not allowed in the ultrasound rooms during exams. Our front office staff cannot provide observation of children in our lobby area while testing is being conducted. An adult accompanying a patient to their appointment will only be allowed in the ultrasound room at the discretion of the ultrasound technician under special circumstances. We apologize for any inconvenience.    Follow-Up: At [facility], you and your health needs are our priority.  As part of our continuing mission to provide you with exceptional heart care, we have created designated Provider Care Teams.  These Care Teams include your primary Cardiologist (physician) and Advanced Practice Providers (APPs -  Physician Assistants and Nurse Practitioners) who all work together to provide you with the care you need, when you need it.     Your next appointment:   24 months  month(s)  The format for your next appointment:   In Person  Provider:   Alm Clay, MD   Other Instructions

## 2024-01-29 ENCOUNTER — Encounter: Payer: Self-pay | Admitting: Cardiology

## 2024-01-29 NOTE — Assessment & Plan Note (Signed)
 Mild prolapse and regurgitation, no significant murmur or worsening expected. Potential for leaflet billowing and regurgitation over time. - Order echocardiogram to assess mitral valve status.

## 2024-01-29 NOTE — Progress Notes (Signed)
 Cardiology Office Note:  .   Date:  01/29/2024  ID:  Maria Evans, DOB 1965-08-19, MRN [id number] PCP: Rolinda Millman, MD  [facility] Providers Cardiologist:  Alm Clay, MD     Chief Complaint  Patient presents with   New Patient (Initial Visit)   Palpitations    Recurrent palpitations.  Has history of mild MVP    Patient Profile: .     Maria Evans is a  58 y.o. female  with a PMH notable for DM-1, and report of potential mitral prolapse who presents here for establish cardiology care to evaluate palpitations and mitral valve disease.  At the request of Rolinda Millman, MD.     Maria Evans was last seen on August 22, 2020: Doing pretty well.  Back to be doing fine.  Pretty active jogging and hiking.  I was able to get the records from her previous cardiologist.  Subjective  Discussed the use of AI scribe software for clinical note transcription with the patient, who gave verbal consent to proceed.  History of Present Illness  Maria Evans is a 58 year old female with reported history of mitral valve prolapse who presents with palpitations.  In March, she experienced episodes of a fast heartbeat, waking her up at night with a heart rate of 116 bpm. She managed these episodes with water, breathing exercises, and reducing caffeine intake. These episodes occurred multiple times that week, prompting her to follow up with her doctor. She has a history of mitral valve prolapse, last evaluated in 2022, and reports occasional palpitations but no recent episodes waking her up at night.  In January, she had a skiing accident resulting in a fractured pelvis and a torn labrum in her hips. Initially, X-rays did not show fractures, but an MRI later confirmed multiple fractures and hip instability. She underwent multiple steroid injections and physical therapy, and by April, she had weaned off oxycodone , which she was taking along with Tylenol  Extra Strength and Motrin for pain  management. She was released from physical therapy.  She is a type 1 diabetic, well-controlled with a recent A1C of 6.2, and is on atorvastatin  for cholesterol management. She takes lisinopril  2.5 mg for renal protection and Synthroid  for hypothyroidism. She occasionally uses Xanax , about once a month, as needed.  Her family history includes a twin with similar cardiac issues, who is on carvedilol. She works as a Teacher, early years/pre at [employer].     Objective   Past Medical History - Pelvic fractures - Hip labral tear with instability - Mitral valve prolapse with mild regurgitation - Type 1 diabetes mellitus - Hypothyroidism  Pertinent Medications - Xanax  (as needed, approximately once a month) - Atorvastatin  20 mg daily - Low-dose Lisinopril  2.5 mg daily - Synthroid  100 mcg daily - Lexapro  20 mg daily; Tresiba  13 units daily and 11 units bedtime lispro insulin  4-10 mg as directed TID for correction and 2 to 4 units NPH daily in the afternoon.  Social History - Alcohol: Occasional use, had wine with dinner during a celebration. - Employment: Teacher, early years/pre at [employer] - Partner Status: Married   Studies Reviewed: SABRA   EKG Interpretation Date/Time:  Tuesday January 27 2024 09:24:25 EDT Ventricular Rate:  66 PR Interval:  174 QRS Duration:  86 QT Interval:  412 QTC Calculation: 431 R Axis:   83  Text Interpretation: Normal sinus rhythm Septal infarct , age undetermined No previous ECGs available Confirmed by Clay Alm ([id number]) on 01/27/2024  9:50:03 AM    Lab Results  Component Value Date   NA 141 07/03/2023   K 4.4 07/03/2023   CREATININE 0.79 07/03/2023   EGFR 88 07/03/2023   GLUCOSE 112 (H) 07/03/2023   Lab Results  Component Value Date   CHOL 200 (H) 07/03/2023   HDL 90 07/03/2023   LDLCALC 93 07/03/2023   TRIG 76 07/03/2023   CHOLHDL 2.2 07/03/2023   Lab Results  Component Value Date   TSH 1.16 07/03/2023    ECHO: (08/2020)  1. Left ventricular ejection  fraction, by estimation, is 50 to 55%. The left ventricle has low normal function. The left ventricle has no regional  wall motion abnormalities. Left ventricular diastolic parameters were normal.   2. Right ventricular systolic function is normal. The right ventricular size is normal. There is normal pulmonary artery systolic pressure. The  estimated right ventricular systolic pressure is 25.5 mmHg.   3. The mitral valve is abnormal. Mild prolapse of anterior leaflet. Trivial mitral valve regurgitation.   4. The aortic valve is tricuspid. Aortic valve regurgitation is not visualized. No aortic stenosis is present.   5. The inferior vena cava is normal in size with <50% respiratory variability, suggesting right atrial pressure of 8 mmHg.   Risk Assessment/Calculations:         Physical Exam:   VS:  BP 109/66 (BP Location: Left Arm, Patient Position: Sitting, Cuff Size: Normal)   Pulse 66   Ht 5' 8.5 (1.74 m)   Wt 152 lb 9.6 oz (69.2 kg)   LMP 02/07/2016   SpO2 96%   BMI 22.87 kg/m    Wt Readings from Last 3 Encounters:  01/27/24 152 lb 9.6 oz (69.2 kg)  01/01/24 155 lb 9.6 oz (70.6 kg)  09/22/23 149 lb (67.6 kg)    GEN: Well nourished, well groomed in no acute distress; healthy-appearing. NECK: No JVD; No carotid bruits CARDIAC: Normal S1, S2; RRR, no murmur but cannot exclude soft systolic click.  No rubs or gallops.  RESPIRATORY:  Clear to auscultation without rales, wheezing or rhonchi ; nonlabored, good air movement. ABDOMEN: Soft, non-tender, non-distended EXTREMITIES:  No edema; No deformity      ASSESSMENT AND PLAN: .    Problem List Items Addressed This Visit     History of mitral valve prolapse in adulthood - Primary (Chronic)   Mild prolapse and regurgitation, no significant murmur or worsening expected. Potential for leaflet billowing and regurgitation over time. - Order echocardiogram to assess mitral valve status.      Relevant Orders   EKG 12-Lead  (Completed)   ECHOCARDIOGRAM COMPLETE   Palpitations (Chronic)   Paroxysmal palpitations (sinus tachycardia and PVCs) Episodes of sinus tachycardia and PVCs, likely stress-related. No significant symptoms. Beta blockers not recommended due to low blood pressure and diabetes. - Monitor palpitations and consider vagal maneuvers for symptomatic relief. - Avoid caffeine and ensure adequate hydration. - Use Apple Watch to record rhythm if palpitations occur. - Consider follow-up if palpitations become more frequent or severe.      Relevant Orders   ECHOCARDIOGRAM COMPLETE            Follow-Up: Return in about 2 years (around 01/26/2026) for 2 Yr Follow-up; last follow-up echocardiogram is worse.SABRA Fus, Alm MICAEL Clay, MD, MS Alm Clay, M.D., M.S. Interventional Chartered certified accountant  Pager # [phone]

## 2024-01-29 NOTE — Assessment & Plan Note (Signed)
 Paroxysmal palpitations (sinus tachycardia and PVCs) Episodes of sinus tachycardia and PVCs, likely stress-related. No significant symptoms. Beta blockers not recommended due to low blood pressure and diabetes. - Monitor palpitations and consider vagal maneuvers for symptomatic relief. - Avoid caffeine and ensure adequate hydration. - Use Apple Watch to record rhythm if palpitations occur. - Consider follow-up if palpitations become more frequent or severe.

## 2024-01-30 ENCOUNTER — Other Ambulatory Visit (HOSPITAL_COMMUNITY): Payer: Self-pay

## 2024-02-16 DIAGNOSIS — L821 Other seborrheic keratosis: Secondary | ICD-10-CM | POA: Diagnosis not present

## 2024-02-16 DIAGNOSIS — D1801 Hemangioma of skin and subcutaneous tissue: Secondary | ICD-10-CM | POA: Diagnosis not present

## 2024-02-16 DIAGNOSIS — D229 Melanocytic nevi, unspecified: Secondary | ICD-10-CM | POA: Diagnosis not present

## 2024-02-16 DIAGNOSIS — Z411 Encounter for cosmetic surgery: Secondary | ICD-10-CM | POA: Diagnosis not present

## 2024-02-16 DIAGNOSIS — L578 Other skin changes due to chronic exposure to nonionizing radiation: Secondary | ICD-10-CM | POA: Diagnosis not present

## 2024-02-16 DIAGNOSIS — L814 Other melanin hyperpigmentation: Secondary | ICD-10-CM | POA: Diagnosis not present

## 2024-02-18 DIAGNOSIS — F4322 Adjustment disorder with anxiety: Secondary | ICD-10-CM | POA: Diagnosis not present

## 2024-02-21 ENCOUNTER — Other Ambulatory Visit (HOSPITAL_COMMUNITY): Payer: Self-pay

## 2024-03-04 ENCOUNTER — Ambulatory Visit (HOSPITAL_COMMUNITY)
Admission: RE | Admit: 2024-03-04 | Discharge: 2024-03-04 | Disposition: A | Discharge status: Home / Self Care | Source: Ambulatory Visit | Attending: Cardiology | Admitting: Cardiology

## 2024-03-04 DIAGNOSIS — R002 Palpitations: Secondary | ICD-10-CM | POA: Diagnosis not present

## 2024-03-04 DIAGNOSIS — Z8679 Personal history of other diseases of the circulatory system: Secondary | ICD-10-CM | POA: Insufficient documentation

## 2024-03-04 DIAGNOSIS — I341 Nonrheumatic mitral (valve) prolapse: Secondary | ICD-10-CM | POA: Diagnosis not present

## 2024-03-04 LAB — ECHOCARDIOGRAM COMPLETE
Area-P 1/2: 3.24 cm2
S' Lateral: 3.4 cm

## 2024-03-06 ENCOUNTER — Ambulatory Visit: Payer: Self-pay | Admitting: Cardiology

## 2024-03-22 ENCOUNTER — Ambulatory Visit (HOSPITAL_BASED_OUTPATIENT_CLINIC_OR_DEPARTMENT_OTHER)
Admission: RE | Admit: 2024-03-22 | Discharge: 2024-03-22 | Disposition: A | Discharge status: Home / Self Care | Source: Ambulatory Visit | Attending: Nurse Practitioner | Admitting: Nurse Practitioner

## 2024-03-22 ENCOUNTER — Other Ambulatory Visit: Payer: Commercial Managed Care - PPO

## 2024-03-22 ENCOUNTER — Ambulatory Visit: Payer: Self-pay | Admitting: Nurse Practitioner

## 2024-03-22 DIAGNOSIS — M8589 Other specified disorders of bone density and structure, multiple sites: Secondary | ICD-10-CM | POA: Diagnosis not present

## 2024-03-22 DIAGNOSIS — M85851 Other specified disorders of bone density and structure, right thigh: Secondary | ICD-10-CM | POA: Diagnosis not present

## 2024-03-22 DIAGNOSIS — Z780 Asymptomatic menopausal state: Secondary | ICD-10-CM | POA: Diagnosis not present

## 2024-03-22 DIAGNOSIS — M85852 Other specified disorders of bone density and structure, left thigh: Secondary | ICD-10-CM | POA: Diagnosis not present

## 2024-04-08 ENCOUNTER — Other Ambulatory Visit: Payer: Self-pay | Admitting: Nurse Practitioner

## 2024-04-08 DIAGNOSIS — Z1231 Encounter for screening mammogram for malignant neoplasm of breast: Secondary | ICD-10-CM

## 2024-04-12 ENCOUNTER — Other Ambulatory Visit: Payer: Self-pay

## 2024-04-19 ENCOUNTER — Other Ambulatory Visit (HOSPITAL_COMMUNITY): Payer: Self-pay

## 2024-04-20 ENCOUNTER — Other Ambulatory Visit: Payer: Self-pay

## 2024-04-22 ENCOUNTER — Other Ambulatory Visit (HOSPITAL_COMMUNITY): Payer: Self-pay

## 2024-04-22 ENCOUNTER — Encounter (HOSPITAL_COMMUNITY): Payer: Self-pay

## 2024-04-26 ENCOUNTER — Encounter: Payer: Self-pay | Admitting: Radiology

## 2024-04-27 ENCOUNTER — Other Ambulatory Visit: Payer: Self-pay

## 2024-04-27 ENCOUNTER — Other Ambulatory Visit (HOSPITAL_COMMUNITY): Payer: Self-pay

## 2024-04-27 MED ORDER — LISINOPRIL 2.5 MG PO TABS
2.5000 mg | ORAL_TABLET | Freq: Every day | ORAL | 0 refills | Status: DC
  Filled 2024-04-27: qty 90, 90d supply, fill #0

## 2024-04-27 MED ORDER — ESCITALOPRAM OXALATE 20 MG PO TABS
20.0000 mg | ORAL_TABLET | Freq: Every day | ORAL | 0 refills | Status: DC
  Filled 2024-04-27: qty 90, 90d supply, fill #0

## 2024-04-30 ENCOUNTER — Ambulatory Visit
Admission: RE | Admit: 2024-04-30 | Discharge: 2024-04-30 | Disposition: A | Discharge status: Home / Self Care | Source: Ambulatory Visit | Attending: Nurse Practitioner | Admitting: Nurse Practitioner

## 2024-04-30 DIAGNOSIS — Z1231 Encounter for screening mammogram for malignant neoplasm of breast: Secondary | ICD-10-CM | POA: Diagnosis not present

## 2024-05-03 ENCOUNTER — Other Ambulatory Visit (HOSPITAL_COMMUNITY): Payer: Self-pay

## 2024-05-03 ENCOUNTER — Encounter: Payer: Self-pay | Admitting: Internal Medicine

## 2024-05-03 ENCOUNTER — Other Ambulatory Visit: Payer: Self-pay

## 2024-05-03 ENCOUNTER — Other Ambulatory Visit: Payer: Self-pay | Admitting: Internal Medicine

## 2024-05-03 DIAGNOSIS — E1065 Type 1 diabetes mellitus with hyperglycemia: Secondary | ICD-10-CM

## 2024-05-03 MED ORDER — DEXCOM G6 TRANSMITTER MISC
3 refills | Status: DC
  Filled 2024-05-03: qty 1, fill #0

## 2024-05-03 MED ORDER — FREESTYLE LITE TEST VI STRP
ORAL_STRIP | 12 refills | Status: AC
  Filled 2024-05-03: qty 100, 50d supply, fill #0

## 2024-05-03 NOTE — Telephone Encounter (Signed)
 Refill request complete

## 2024-05-04 ENCOUNTER — Other Ambulatory Visit (HOSPITAL_COMMUNITY): Payer: Self-pay

## 2024-05-05 ENCOUNTER — Other Ambulatory Visit (HOSPITAL_COMMUNITY): Payer: Self-pay

## 2024-05-05 ENCOUNTER — Other Ambulatory Visit: Payer: Self-pay | Admitting: Internal Medicine

## 2024-05-06 ENCOUNTER — Other Ambulatory Visit (HOSPITAL_COMMUNITY): Payer: Self-pay

## 2024-05-06 MED ORDER — DEXCOM G6 SENSOR MISC
1.0000 | 3 refills | Status: DC
  Filled 2024-05-06: qty 9, 90d supply, fill #0

## 2024-05-06 NOTE — Addendum Note (Signed)
 Addended by: CLEOTILDE ROLIN RAMAN on: 05/06/2024 07:53 AM   Modules accepted: Orders

## 2024-05-07 ENCOUNTER — Other Ambulatory Visit (HOSPITAL_COMMUNITY): Payer: Self-pay

## 2024-05-07 DIAGNOSIS — Z79899 Other long term (current) drug therapy: Secondary | ICD-10-CM | POA: Diagnosis not present

## 2024-05-07 DIAGNOSIS — G8929 Other chronic pain: Secondary | ICD-10-CM | POA: Diagnosis not present

## 2024-05-07 MED ORDER — VALACYCLOVIR HCL 500 MG PO TABS
ORAL_TABLET | ORAL | 0 refills | Status: DC
  Filled 2024-05-07: qty 10, 90d supply, fill #0

## 2024-05-07 MED ORDER — CYCLOBENZAPRINE HCL 10 MG PO TABS
10.0000 mg | ORAL_TABLET | Freq: Every day | ORAL | 0 refills | Status: DC
  Filled 2024-05-07: qty 30, 30d supply, fill #0

## 2024-05-07 MED ORDER — ALPRAZOLAM 0.5 MG PO TABS
0.5000 mg | ORAL_TABLET | Freq: Every day | ORAL | 0 refills | Status: AC
  Filled 2024-05-07: qty 30, 30d supply, fill #0

## 2024-05-07 MED ORDER — DICLOFENAC SODIUM 25 MG PO TBEC
25.0000 mg | DELAYED_RELEASE_TABLET | Freq: Three times a day (TID) | ORAL | 0 refills | Status: AC | PRN
  Filled 2024-05-07: qty 30, 10d supply, fill #0

## 2024-05-07 MED ORDER — ESCITALOPRAM OXALATE 20 MG PO TABS
20.0000 mg | ORAL_TABLET | Freq: Every day | ORAL | 2 refills | Status: DC
  Filled 2024-05-07: qty 90, 90d supply, fill #0

## 2024-05-07 MED ORDER — FLUOCINONIDE 0.05 % EX GEL
CUTANEOUS | 0 refills | Status: DC
  Filled 2024-05-07: qty 30, 30d supply, fill #0

## 2024-05-07 MED ORDER — LISINOPRIL 2.5 MG PO TABS
2.5000 mg | ORAL_TABLET | Freq: Every day | ORAL | 2 refills | Status: DC
  Filled 2024-05-07: qty 90, 90d supply, fill #0

## 2024-05-10 ENCOUNTER — Other Ambulatory Visit (HOSPITAL_COMMUNITY): Payer: Self-pay

## 2024-05-10 ENCOUNTER — Other Ambulatory Visit: Payer: Self-pay

## 2024-05-21 ENCOUNTER — Other Ambulatory Visit (HOSPITAL_COMMUNITY): Payer: Self-pay

## 2024-05-21 ENCOUNTER — Other Ambulatory Visit: Payer: Self-pay

## 2024-05-22 ENCOUNTER — Other Ambulatory Visit (HOSPITAL_COMMUNITY): Payer: Self-pay

## 2024-05-22 MED ORDER — VALACYCLOVIR HCL 500 MG PO TABS
2000.0000 mg | ORAL_TABLET | Freq: Two times a day (BID) | ORAL | 0 refills | Status: AC
  Filled 2024-05-22 – 2024-07-12 (×3): qty 10, 1d supply, fill #0

## 2024-05-24 ENCOUNTER — Other Ambulatory Visit (HOSPITAL_COMMUNITY): Payer: Self-pay

## 2024-06-18 ENCOUNTER — Telehealth: Admitting: Family Medicine

## 2024-06-18 ENCOUNTER — Other Ambulatory Visit (HOSPITAL_COMMUNITY): Payer: Self-pay

## 2024-06-18 DIAGNOSIS — R059 Cough, unspecified: Secondary | ICD-10-CM | POA: Diagnosis not present

## 2024-06-18 MED ORDER — PROMETHAZINE-DM 6.25-15 MG/5ML PO SYRP
5.0000 mL | ORAL_SOLUTION | Freq: Four times a day (QID) | ORAL | 0 refills | Status: AC | PRN
  Filled 2024-06-18: qty 100, 5d supply, fill #0

## 2024-06-18 NOTE — Progress Notes (Signed)
 We are sorry that you are not feeling well.  Here is how we plan to help!  Based on your presentation I believe you most likely have A cough due to a virus.  This is called viral bronchitis and is best treated by rest, plenty of fluids and control of the cough.  You may use Ibuprofen or Tylenol  as directed to help your symptoms.     In addition you may use  A prescription cough medication called Promethazine -DM 6.25-15 mg/5 mL. Take 5 mL every 6 hours as needed for cough. This medication was sent to the pharmacy.    From your responses in the eVisit questionnaire you describe inflammation in the upper respiratory tract which is causing a significant cough.  This is commonly called Bronchitis and has four common causes:   Allergies Viral Infections Acid Reflux Bacterial Infection Allergies, viruses and acid reflux are treated by controlling symptoms or eliminating the cause. An example might be a cough caused by taking certain blood pressure medications. You stop the cough by changing the medication. Another example might be a cough caused by acid reflux. Controlling the reflux helps control the cough.  USE OF BRONCHODILATOR (RESCUE) INHALERS: There is a risk from using your bronchodilator too frequently.  The risk is that over-reliance on a medication which only relaxes the muscles surrounding the breathing tubes can reduce the effectiveness of medications prescribed to reduce swelling and congestion of the tubes themselves.  Although you feel brief relief from the bronchodilator inhaler, your asthma may actually be worsening with the tubes becoming more swollen and filled with mucus.  This can delay other crucial treatments, such as oral steroid medications. If you need to use a bronchodilator inhaler daily, several times per day, you should discuss this with your provider.  There are probably better treatments that could be used to keep your asthma under control.     HOME CARE Only take  medications as instructed by your medical team. Complete the entire course of an antibiotic. Drink plenty of fluids and get plenty of rest. Avoid close contacts especially the very young and the elderly Cover your mouth if you cough or cough into your sleeve. Always remember to wash your hands A steam or ultrasonic humidifier can help congestion.   GET HELP RIGHT AWAY IF: You develop worsening fever. You become short of breath You cough up blood. Your symptoms persist after you have completed your treatment plan MAKE SURE YOU  Understand these instructions. Will watch your condition. Will get help right away if you are not doing well or get worse.  Your e-visit answers were reviewed by a board certified advanced clinical practitioner to complete your personal care plan.  Depending on the condition, your plan could have included both over the counter or prescription medications. If there is a problem please reply  once you have received a response from your provider. Your safety is important to us .  If you have drug allergies check your prescription carefully.    You can use MyChart to ask questions about todays visit, request a non-urgent call back, or ask for a work or school excuse for 24 hours related to this e-Visit. If it has been greater than 24 hours you will need to follow up with your provider, or enter a new e-Visit to address those concerns. You will get an e-mail in the next two days asking about your experience.  I hope that your e-visit has been valuable and will speed your  recovery. Thank you for using e-visits.   I have spent 5 minutes in review of e-visit questionnaire, review and updating patient chart, medical decision making and response to patient.   Roosvelt Mater, PA-C

## 2024-06-19 ENCOUNTER — Other Ambulatory Visit (HOSPITAL_COMMUNITY): Payer: Self-pay

## 2024-06-21 ENCOUNTER — Ambulatory Visit: Admitting: Internal Medicine

## 2024-06-21 ENCOUNTER — Other Ambulatory Visit (HOSPITAL_COMMUNITY): Payer: Self-pay

## 2024-06-21 ENCOUNTER — Encounter: Payer: Self-pay | Admitting: Internal Medicine

## 2024-06-21 ENCOUNTER — Other Ambulatory Visit

## 2024-06-21 ENCOUNTER — Other Ambulatory Visit: Payer: Self-pay

## 2024-06-21 VITALS — BP 104/70 | HR 94 | Ht 68.5 in | Wt 156.0 lb

## 2024-06-21 DIAGNOSIS — E1065 Type 1 diabetes mellitus with hyperglycemia: Secondary | ICD-10-CM | POA: Diagnosis not present

## 2024-06-21 DIAGNOSIS — E039 Hypothyroidism, unspecified: Secondary | ICD-10-CM

## 2024-06-21 LAB — POCT GLYCOSYLATED HEMOGLOBIN (HGB A1C): Hemoglobin A1C: 7.1 % — AB (ref 4.0–5.6)

## 2024-06-21 MED ORDER — INSUPEN PEN NEEDLES 32G X 4 MM MISC
1.0000 | Freq: Every day | 3 refills | Status: AC
  Filled 2024-06-21: qty 300, 50d supply, fill #0

## 2024-06-21 MED ORDER — TRESIBA FLEXTOUCH 100 UNIT/ML ~~LOC~~ SOPN
PEN_INJECTOR | SUBCUTANEOUS | 3 refills | Status: AC
  Filled 2024-06-21: qty 12, 50d supply, fill #0

## 2024-06-21 MED ORDER — GVOKE HYPOPEN 1-PACK 1 MG/0.2ML ~~LOC~~ SOAJ
0.2000 mL | SUBCUTANEOUS | 99 refills | Status: AC | PRN
  Filled 2024-06-21: qty 0.2, 1d supply, fill #0

## 2024-06-21 MED ORDER — DEXCOM G7 SENSOR MISC
3.0000 | 4 refills | Status: AC
  Filled 2024-06-21 – 2024-07-12 (×2): qty 9, 90d supply, fill #0

## 2024-06-21 MED ORDER — LYUMJEV KWIKPEN 100 UNIT/ML ~~LOC~~ SOPN: 4.0000 [IU] | PEN_INJECTOR | Freq: Three times a day (TID) | SUBCUTANEOUS | 11 refills | Status: AC

## 2024-06-21 MED ORDER — HUMULIN N KWIKPEN 100 UNIT/ML ~~LOC~~ SUPN
2.0000 [IU] | PEN_INJECTOR | Freq: Every day | SUBCUTANEOUS | 11 refills | Status: AC
  Filled 2024-06-21 – 2024-07-12 (×2): qty 9, 84d supply, fill #0
  Filled 2024-07-14: qty 18, 84d supply, fill #0

## 2024-06-21 NOTE — Addendum Note (Signed)
 Addended by: ARELIA DIETRICH SAILOR on: 06/21/2024 04:55 PM   Modules accepted: Orders
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
## 2024-06-21 NOTE — Patient Instructions (Addendum)
 Please continue: - Tresiba  11 units at bedtime - Lyumjev  4-10 units 3 times a day before meals + correction  - 1:10 ICR, target 120, ISF 60-65  But: - 1:8 if you eat a meal with more carbs/starches - 1:12 if you plan to be active after a meal  -  skip it completely if you have a small meal  Restart: - NPH 1 unit for 15 g carbs before dinner  Do not correct hyperglycemia with more than 1 unit. Do not correct hypoglycemia with more than 2 Garrel and Ike's.  Please continue Levothyroxine  100 mcg daily.  Take the thyroid  hormone every day, with water, at least 30 minutes before breakfast, separated by at least 4 hours from: - acid reflux medications - calcium  - iron - multivitamins  Please return in 6 months.

## 2024-06-22 ENCOUNTER — Other Ambulatory Visit (HOSPITAL_COMMUNITY): Payer: Self-pay

## 2024-06-22 ENCOUNTER — Ambulatory Visit: Payer: Self-pay | Admitting: Internal Medicine

## 2024-06-22 ENCOUNTER — Other Ambulatory Visit: Payer: Self-pay

## 2024-06-22 LAB — COMPREHENSIVE METABOLIC PANEL WITH GFR
AG Ratio: 2 (calc) (ref 1.0–2.5)
ALT: 15 U/L (ref 6–29)
AST: 18 U/L (ref 10–35)
Albumin: 4.6 g/dL (ref 3.6–5.1)
Alkaline phosphatase (APISO): 83 U/L (ref 37–153)
BUN: 16 mg/dL (ref 7–25)
CO2: 32 mmol/L (ref 20–32)
Calcium: 9.6 mg/dL (ref 8.6–10.4)
Chloride: 100 mmol/L (ref 98–110)
Creat: 0.89 mg/dL (ref 0.50–1.03)
Globulin: 2.3 g/dL (ref 1.9–3.7)
Glucose, Bld: 128 mg/dL — ABNORMAL HIGH (ref 65–99)
Potassium: 4.1 mmol/L (ref 3.5–5.3)
Sodium: 139 mmol/L (ref 135–146)
Total Bilirubin: 0.4 mg/dL (ref 0.2–1.2)
Total Protein: 6.9 g/dL (ref 6.1–8.1)
eGFR: 76 mL/min/1.73m2

## 2024-06-22 LAB — LIPID PANEL W/REFLEX DIRECT LDL
Cholesterol: 207 mg/dL — ABNORMAL HIGH
HDL: 89 mg/dL
LDL Cholesterol (Calc): 104 mg/dL — ABNORMAL HIGH
Non-HDL Cholesterol (Calc): 118 mg/dL
Total CHOL/HDL Ratio: 2.3 (calc)
Triglycerides: 56 mg/dL

## 2024-06-22 LAB — MICROALBUMIN / CREATININE URINE RATIO
Creatinine, Urine: 26 mg/dL (ref 20–275)
Microalb Creat Ratio: 8 mg/g{creat}
Microalb, Ur: 0.2 mg/dL

## 2024-06-22 LAB — TSH: TSH: 1.89 m[IU]/L (ref 0.40–4.50)

## 2024-06-22 LAB — T4, FREE: Free T4: 1.5 ng/dL (ref 0.8–1.8)

## 2024-06-22 MED ORDER — LEVOTHYROXINE SODIUM 100 MCG PO TABS
100.0000 ug | ORAL_TABLET | Freq: Every day | ORAL | 3 refills | Status: AC
  Filled 2024-06-22: qty 90, 90d supply, fill #0

## 2024-06-22 NOTE — Addendum Note (Signed)
 Addended by: TRIXIE FILE on: 06/22/2024 10:18 AM   Modules accepted: Orders

## 2024-06-23 ENCOUNTER — Other Ambulatory Visit (HOSPITAL_COMMUNITY): Payer: Self-pay

## 2024-06-29 ENCOUNTER — Other Ambulatory Visit (HOSPITAL_COMMUNITY): Payer: Self-pay

## 2024-06-30 ENCOUNTER — Encounter (HOSPITAL_BASED_OUTPATIENT_CLINIC_OR_DEPARTMENT_OTHER): Payer: Self-pay | Admitting: Orthopaedic Surgery

## 2024-07-12 ENCOUNTER — Other Ambulatory Visit: Payer: Self-pay

## 2024-07-12 ENCOUNTER — Other Ambulatory Visit (HOSPITAL_COMMUNITY): Payer: Self-pay

## 2024-07-14 ENCOUNTER — Other Ambulatory Visit (HOSPITAL_COMMUNITY): Payer: Self-pay

## 2024-08-02 ENCOUNTER — Ambulatory Visit: Payer: Commercial Managed Care - PPO | Admitting: Nurse Practitioner

## 2024-12-20 ENCOUNTER — Ambulatory Visit: Admitting: Internal Medicine
